# Patient Record
Sex: Male | Born: 1950 | Race: White | Hispanic: No | Marital: Married | Smoking: Never smoker
Health system: Southern US, Community
[De-identification: ages and names within clinical notes are randomized; demographics above are authoritative.]

## PROBLEM LIST (undated history)

## (undated) DIAGNOSIS — M069 Rheumatoid arthritis, unspecified: Secondary | ICD-10-CM

## (undated) DIAGNOSIS — T7840XA Allergy, unspecified, initial encounter: Secondary | ICD-10-CM

## (undated) DIAGNOSIS — K219 Gastro-esophageal reflux disease without esophagitis: Secondary | ICD-10-CM

## (undated) DIAGNOSIS — M359 Systemic involvement of connective tissue, unspecified: Secondary | ICD-10-CM

## (undated) DIAGNOSIS — C4490 Unspecified malignant neoplasm of skin, unspecified: Secondary | ICD-10-CM

## (undated) DIAGNOSIS — I4891 Unspecified atrial fibrillation: Secondary | ICD-10-CM

## (undated) DIAGNOSIS — IMO0002 Reserved for concepts with insufficient information to code with codable children: Secondary | ICD-10-CM

## (undated) DIAGNOSIS — R911 Solitary pulmonary nodule: Secondary | ICD-10-CM

## (undated) DIAGNOSIS — J45909 Unspecified asthma, uncomplicated: Secondary | ICD-10-CM

## (undated) DIAGNOSIS — K648 Other hemorrhoids: Secondary | ICD-10-CM

## (undated) HISTORY — PX: ESOPHAGOGASTRODUODENOSCOPY: SHX5428

## (undated) HISTORY — PX: COLONOSCOPY: SHX174

## (undated) HISTORY — PX: WRIST FUSION: SHX839

## (undated) HISTORY — DX: Unspecified malignant neoplasm of skin, unspecified: C44.90

## (undated) HISTORY — DX: Other hemorrhoids: K64.8

## (undated) HISTORY — DX: Reserved for concepts with insufficient information to code with codable children: IMO0002

## (undated) HISTORY — DX: Allergy, unspecified, initial encounter: T78.40XA

## (undated) HISTORY — PX: COLONOSCOPY W/ POLYPECTOMY: SHX1380

## (undated) HISTORY — PX: FOOT SURGERY: SHX648

## (undated) HISTORY — DX: Solitary pulmonary nodule: R91.1

## (undated) HISTORY — DX: Gastro-esophageal reflux disease without esophagitis: K21.9

## (undated) HISTORY — DX: Unspecified atrial fibrillation: I48.91

---

## 2001-12-15 ENCOUNTER — Encounter: Payer: Self-pay | Admitting: Emergency Medicine

## 2001-12-16 ENCOUNTER — Inpatient Hospital Stay (HOSPITAL_COMMUNITY): Admission: EM | Admit: 2001-12-16 | Discharge: 2001-12-18 | Payer: Self-pay | Admitting: *Deleted

## 2002-03-06 ENCOUNTER — Ambulatory Visit (HOSPITAL_COMMUNITY): Admission: RE | Admit: 2002-03-06 | Discharge: 2002-03-06 | Payer: Self-pay | Admitting: Family Medicine

## 2002-03-06 ENCOUNTER — Encounter: Payer: Self-pay | Admitting: Family Medicine

## 2003-05-07 ENCOUNTER — Ambulatory Visit (HOSPITAL_COMMUNITY): Admission: RE | Admit: 2003-05-07 | Discharge: 2003-05-07 | Payer: Self-pay | Admitting: Family Medicine

## 2003-05-14 ENCOUNTER — Ambulatory Visit (HOSPITAL_COMMUNITY): Admission: RE | Admit: 2003-05-14 | Discharge: 2003-05-14 | Payer: Self-pay | Admitting: Family Medicine

## 2003-05-25 HISTORY — PX: SHOULDER OPEN ROTATOR CUFF REPAIR: SHX2407

## 2010-06-13 ENCOUNTER — Encounter: Payer: Self-pay | Admitting: Internal Medicine

## 2010-06-13 ENCOUNTER — Encounter: Payer: Self-pay | Admitting: Family Medicine

## 2010-06-26 ENCOUNTER — Other Ambulatory Visit (HOSPITAL_COMMUNITY): Payer: Self-pay | Admitting: Family Medicine

## 2010-06-26 ENCOUNTER — Ambulatory Visit (HOSPITAL_COMMUNITY)
Admission: RE | Admit: 2010-06-26 | Discharge: 2010-06-26 | Disposition: A | Discharge status: Home / Self Care | Payer: Managed Care, Other (non HMO) | Source: Ambulatory Visit | Attending: Family Medicine | Admitting: Family Medicine

## 2010-06-26 DIAGNOSIS — R209 Unspecified disturbances of skin sensation: Secondary | ICD-10-CM | POA: Insufficient documentation

## 2010-06-26 DIAGNOSIS — M5137 Other intervertebral disc degeneration, lumbosacral region: Secondary | ICD-10-CM | POA: Insufficient documentation

## 2010-06-26 DIAGNOSIS — M545 Low back pain, unspecified: Secondary | ICD-10-CM | POA: Insufficient documentation

## 2010-06-26 DIAGNOSIS — M549 Dorsalgia, unspecified: Secondary | ICD-10-CM

## 2010-06-26 DIAGNOSIS — M51379 Other intervertebral disc degeneration, lumbosacral region without mention of lumbar back pain or lower extremity pain: Secondary | ICD-10-CM | POA: Insufficient documentation

## 2010-12-13 ENCOUNTER — Encounter: Payer: Self-pay | Admitting: *Deleted

## 2010-12-13 ENCOUNTER — Other Ambulatory Visit: Payer: Self-pay

## 2010-12-13 ENCOUNTER — Emergency Department (HOSPITAL_COMMUNITY)
Admission: EM | Admit: 2010-12-13 | Discharge: 2010-12-13 | Disposition: A | Discharge status: Other Acute Inpatient Hospital | Payer: Managed Care, Other (non HMO) | Attending: Emergency Medicine | Admitting: Emergency Medicine

## 2010-12-13 ENCOUNTER — Emergency Department (HOSPITAL_COMMUNITY): Payer: Managed Care, Other (non HMO)

## 2010-12-13 DIAGNOSIS — M069 Rheumatoid arthritis, unspecified: Secondary | ICD-10-CM | POA: Insufficient documentation

## 2010-12-13 DIAGNOSIS — J9801 Acute bronchospasm: Secondary | ICD-10-CM | POA: Insufficient documentation

## 2010-12-13 DIAGNOSIS — K219 Gastro-esophageal reflux disease without esophagitis: Secondary | ICD-10-CM | POA: Insufficient documentation

## 2010-12-13 HISTORY — DX: Gastro-esophageal reflux disease without esophagitis: K21.9

## 2010-12-13 HISTORY — DX: Rheumatoid arthritis, unspecified: M06.9

## 2010-12-13 LAB — CBC
HCT: 44.9 % (ref 39.0–52.0)
MCV: 89.6 fL (ref 78.0–100.0)
RBC: 5.01 MIL/uL (ref 4.22–5.81)
WBC: 9.1 10*3/uL (ref 4.0–10.5)

## 2010-12-13 LAB — BASIC METABOLIC PANEL
BUN: 20 mg/dL (ref 6–23)
Chloride: 101 mEq/L (ref 96–112)
GFR calc Af Amer: >60 mL/min (ref >60)
Glucose, Bld: 123 mg/dL — ABNORMAL HIGH (ref 70–99)
Potassium: 3.2 mEq/L — ABNORMAL LOW (ref 3.5–5.1)

## 2010-12-13 IMAGING — CR DG CHEST 2V
2 series · 2 of 2 positions shown · non-contrast
Comparison: None.

CLINICAL DATA: Shortness of breath.

CHEST - 2 VIEW

[view not recorded (1 of 2)]
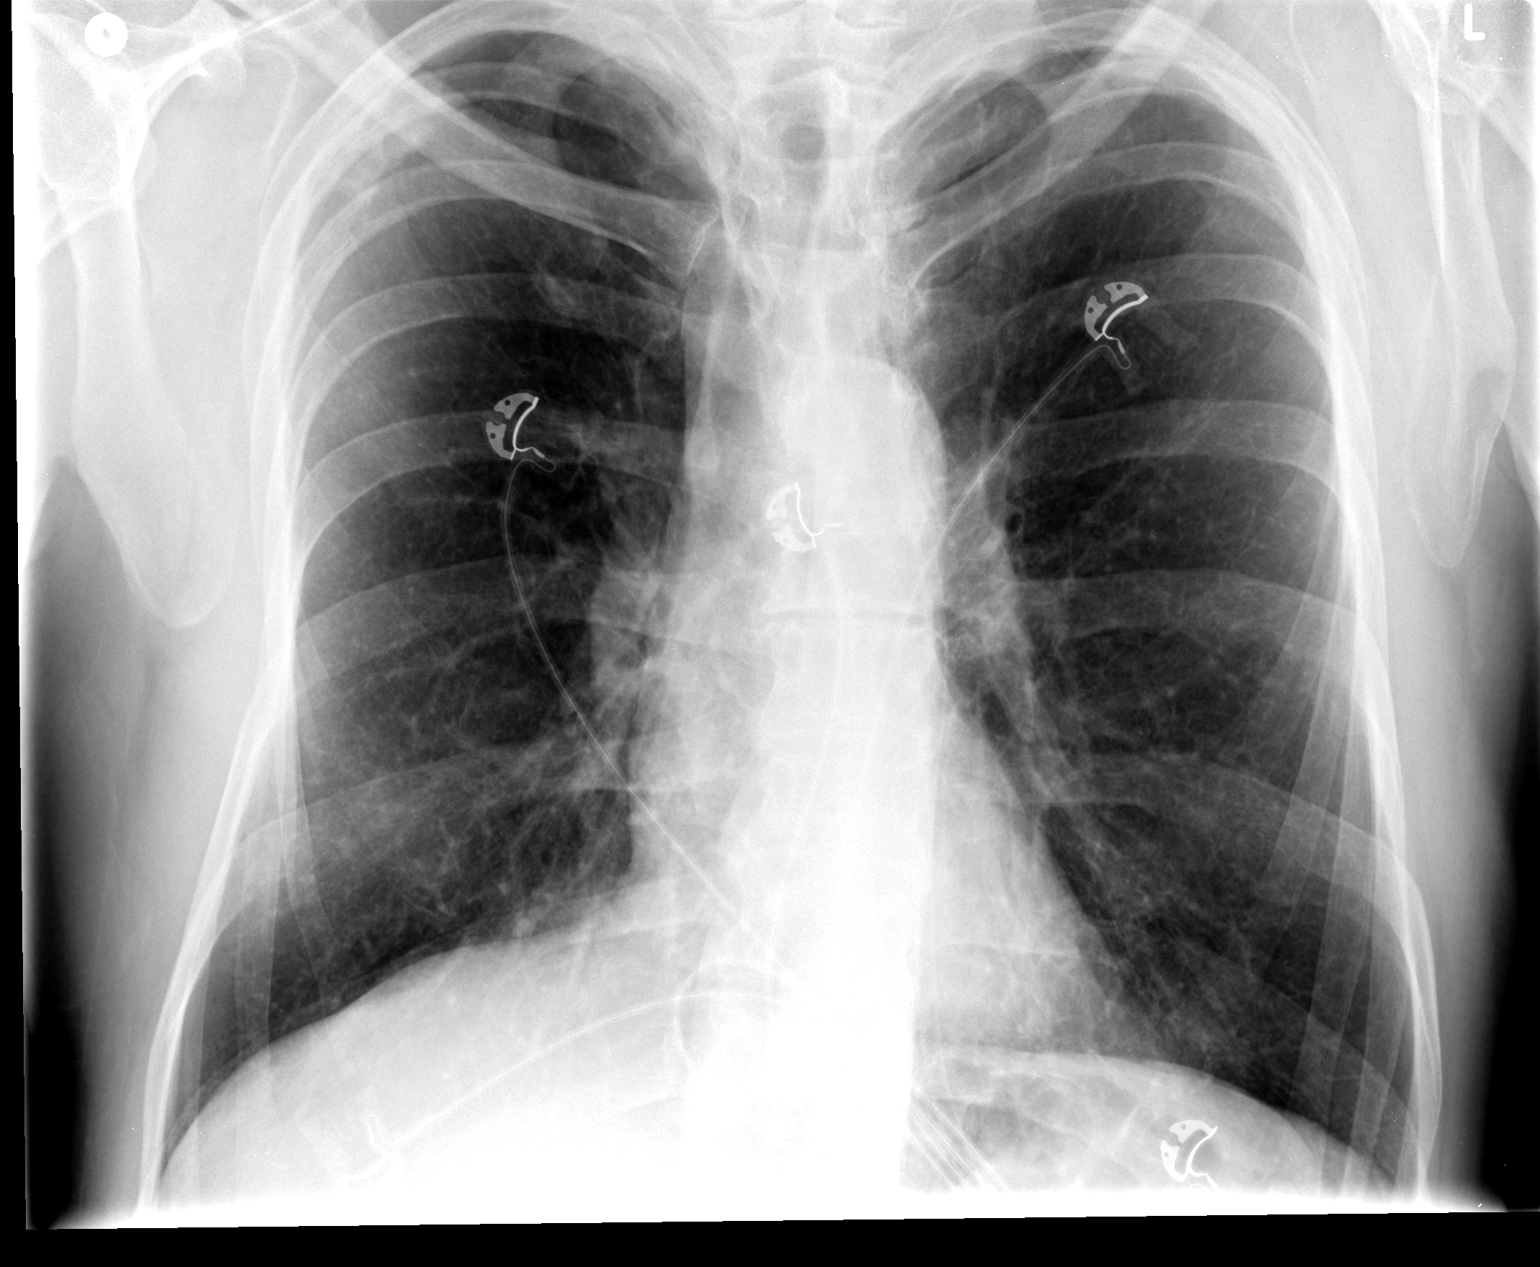

[view not recorded (2 of 2)]
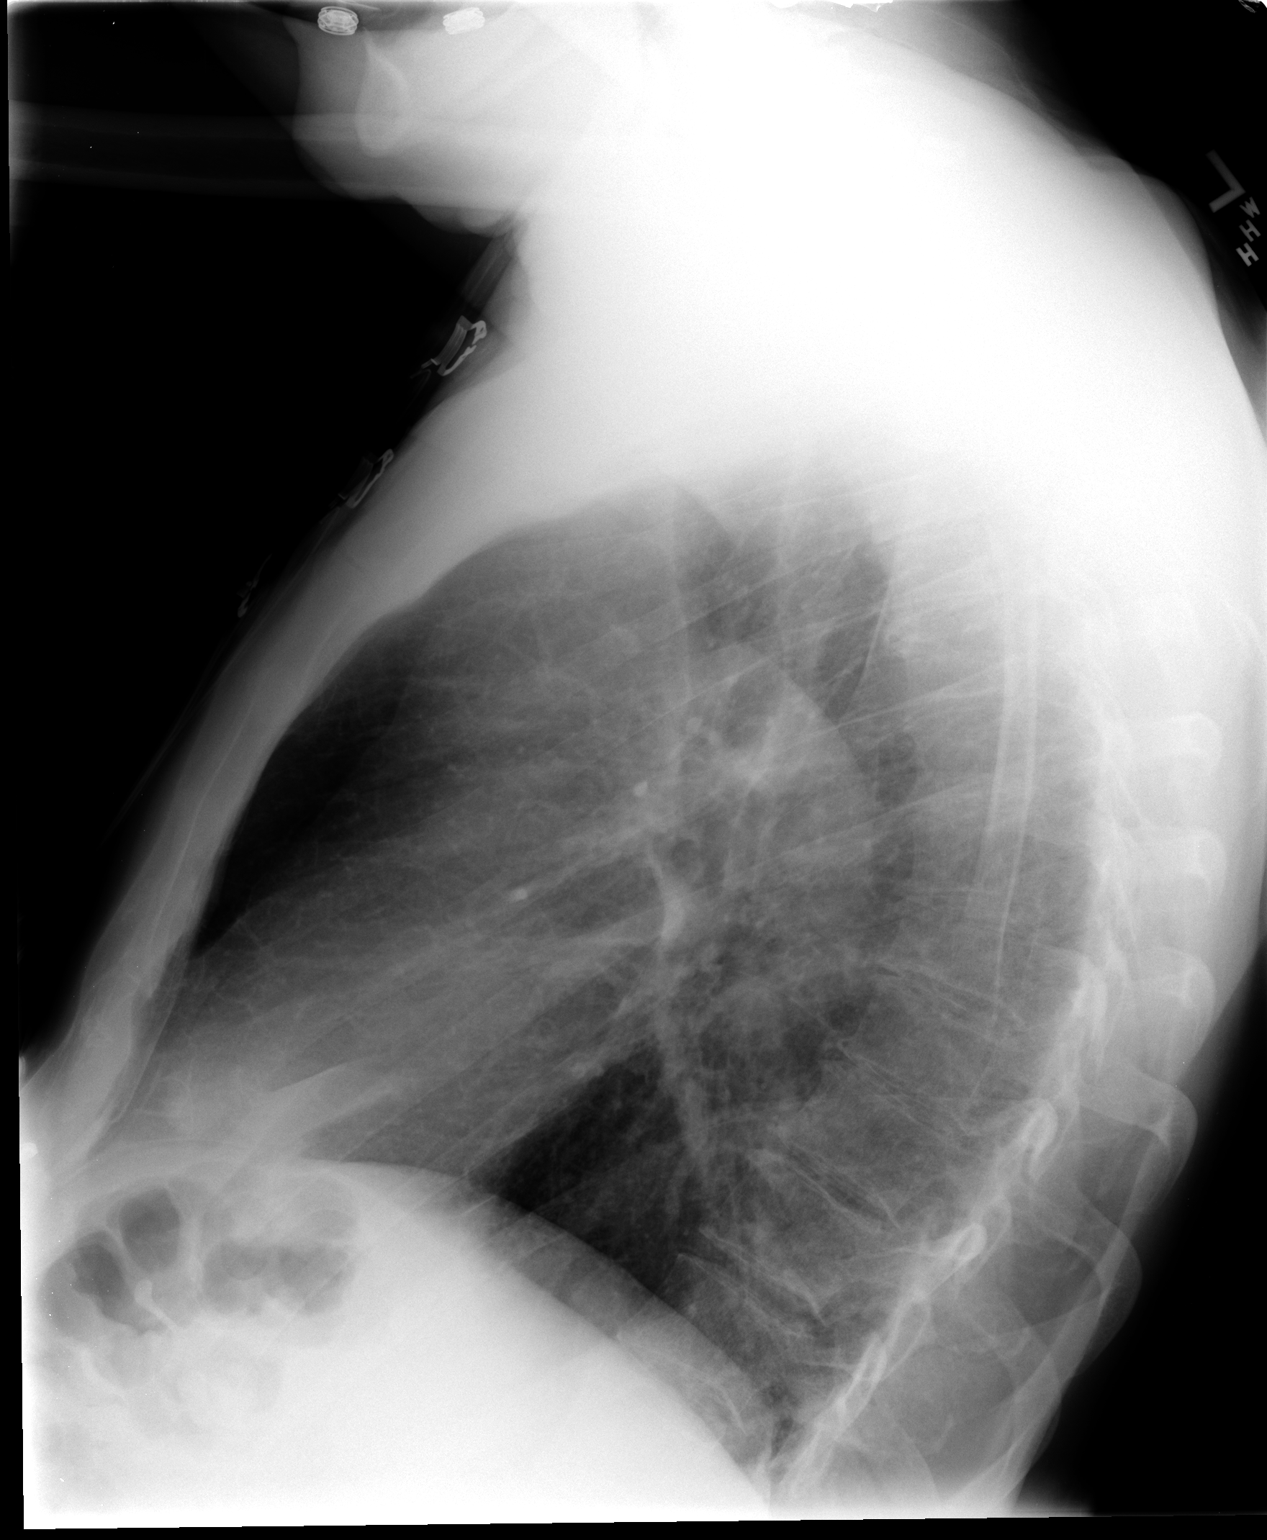

[2 of 2 positions shown; findings below may reference images not displayed]

FINDINGS: Hyperexpanded.  Mild coarse interstitial markings.  No
focal consolidation, pleural effusion, or pneumothorax.  Diffuse
osteopenia.  Atherosclerotic disease.
IMPRESSION: Chronic changes with hyperexpanded lungs.  No focal consolidation.

## 2010-12-13 MED ORDER — IPRATROPIUM BROMIDE 0.02 % IN SOLN
0.5000 mg | Freq: Once | RESPIRATORY_TRACT | Status: AC
  Administered 2010-12-13: 0.5 mg via RESPIRATORY_TRACT

## 2010-12-13 MED ORDER — GI COCKTAIL ~~LOC~~
30.0000 mL | Freq: Once | ORAL | Status: AC
  Administered 2010-12-13: 30 mL via ORAL
  Filled 2010-12-13 (×2): qty 30

## 2010-12-13 MED ORDER — SODIUM CHLORIDE 0.9 % IV SOLN
Freq: Once | INTRAVENOUS | Status: AC
  Administered 2010-12-13: 02:00:00 via INTRAVENOUS

## 2010-12-13 MED ORDER — ALBUTEROL SULFATE (5 MG/ML) 0.5% IN NEBU
5.0000 mg | INHALATION_SOLUTION | Freq: Once | RESPIRATORY_TRACT | Status: AC
  Administered 2010-12-13: 5 mg via RESPIRATORY_TRACT

## 2010-12-13 MED ORDER — ALBUTEROL SULFATE HFA 108 (90 BASE) MCG/ACT IN AERS
2.0000 | INHALATION_SPRAY | Freq: Once | RESPIRATORY_TRACT | Status: AC
  Administered 2010-12-13: 2 via RESPIRATORY_TRACT
  Filled 2010-12-13: qty 6.7

## 2010-12-13 NOTE — ED Notes (Signed)
Pt arrived via POV, tonight began having intermittent episodes of sob and coughing

## 2010-12-13 NOTE — ED Notes (Signed)
Pt RR 18 - 20

## 2010-12-13 NOTE — ED Provider Notes (Addendum)
History     Chief Complaint  Patient presents with  . Shortness of Breath   Patient is a 60 y.o. male presenting with shortness of breath. The history is provided by the patient.  Shortness of Breath  The current episode started more than 1 week ago. The onset was gradual. The problem occurs frequently. The problem has been gradually worsening (increasing episdoes of difficulty breathing, followed by normal breathing. reports there are times when he just can't catch his breathing. no CP). The problem is moderate. The symptoms are relieved by nothing. The symptoms are aggravated by nothing. Associated symptoms include sore throat, cough and shortness of breath. Pertinent negatives include no chest pain, no chest pressure, no orthopnea, no fever and no wheezing. The cough has no precipitants. The cough is productive. Nothing relieves the cough. Nothing worsens the cough. He has been experiencing a mild sore throat. The sore throat is characterized by pain only. His past medical history does not include asthma or past wheezing. Recent Medical Care: currently on levaquin by pcp.    Past Medical History  Diagnosis Date  . Rheumatoid arthritis   . Acid reflux disease     Past Surgical History  Procedure Date  . Wrist fusion     No family history on file.  History  Substance Use Topics  . Smoking status: Never Smoker   . Smokeless tobacco: Not on file  . Alcohol Use: No      Review of Systems  Constitutional: Negative for fever.  HENT: Positive for sore throat.   Respiratory: Positive for cough and shortness of breath. Negative for wheezing.   Cardiovascular: Negative for chest pain and orthopnea.  All other systems reviewed and are negative.    Physical Exam  BP 125/73  Pulse 69  Temp(Src) 98.2 F (36.8 C) (Oral)  Resp 10  SpO2 100%  Physical Exam  Nursing note and vitals reviewed. Constitutional: He is oriented to person, place, and time. He appears well-developed and  well-nourished.  HENT:  Head: Normocephalic and atraumatic.  Mouth/Throat: Oropharynx is clear and moist. No oropharyngeal exudate.  Eyes: EOM are normal.  Neck: Normal range of motion.  Cardiovascular: Normal rate, regular rhythm, normal heart sounds and intact distal pulses.   Pulmonary/Chest: Effort normal and breath sounds normal. No respiratory distress.  Abdominal: Soft. He exhibits no distension. There is no tenderness.  Musculoskeletal: Normal range of motion.  Neurological: He is alert and oriented to person, place, and time.  Skin: Skin is warm and dry.  Psychiatric: He has a normal mood and affect. Judgment normal.    ED Course  Procedures  MDM Pt with acute episode while I was in the room. It appeared more like bronchospasm or transient vocal cord spasm. Pt does report bad taste in his mouth and some change in his symptoms with laying flat. He also reports a "rawness" of his throat. Posterior pharynx appears normal. Significant improvement with albuterol. Already on protonix. Home with albuterol. Suspicion highest for bronchospasm. Doubt PE. Doubt atypical ACS. Doubt ball valve phenomenon near his glottis. No weight loss. Home in improved condition  Results for orders placed during the hospital encounter of 12/13/10  CBC      Component Value Range   WBC 9.1  4.0 - 10.5 (K/uL)   RBC 5.01  4.22 - 5.81 (MIL/uL)   Hemoglobin 16.3  13.0 - 17.0 (g/dL)   HCT 40.9  81.1 - 91.4 (%)   MCV 89.6  78.0 - 100.0 (  fL)   MCH 32.5  26.0 - 34.0 (pg)   MCHC 36.3 (*) 30.0 - 36.0 (g/dL)   RDW 84.1  32.4 - 40.1 (%)   Platelets 172  150 - 400 (K/uL)  TROPONIN I      Component Value Range   Troponin I <0.30  <0.30 (ng/mL)  BASIC METABOLIC PANEL      Component Value Range   Sodium 136  135 - 145 (mEq/L)   Potassium 3.2 (*) 3.5 - 5.1 (mEq/L)   Chloride 101  96 - 112 (mEq/L)   CO2 22  19 - 32 (mEq/L)   Glucose, Bld 123 (*) 70 - 99 (mg/dL)   BUN 20  6 - 23 (mg/dL)   Creatinine, Ser 0.27   0.50 - 1.35 (mg/dL)   Calcium 9.8  8.4 - 25.3 (mg/dL)   GFR calc non Af Amer >60  >60 (mL/min)   GFR calc Af Amer >60  >60 (mL/min)   Dg Neck Soft Tissue  12/13/2010  *RADIOLOGY REPORT*  Clinical Data: Dyspnea, sore throat  NECK SOFT TISSUES - 1+ VIEW  Comparison: None.  Findings: No prevertebral soft tissue swelling.  No radiopaque foreign body identified.  The visualized osseous structures are within normal limits. The airway appears patent.  IMPRESSION: No acute abnormality identified.  Original Report Authenticated By: Waneta Martins, M.D.   Dg Chest 2 View  12/13/2010  *RADIOLOGY REPORT*  Clinical Data: Shortness of breath.  CHEST - 2 VIEW  Comparison: None.  Findings: Hyperexpanded.  Mild coarse interstitial markings.  No focal consolidation, pleural effusion, or pneumothorax.  Diffuse osteopenia.  Atherosclerotic disease.  IMPRESSION: Chronic changes with hyperexpanded lungs.  No focal consolidation.  Original Report Authenticated By: Waneta Martins, M.D.          Lyanne Co, MD 12/13/10 0328   Date: 12/13/2010  Rate: 76  Rhythm: normal sinus rhythm  QRS Axis: normal  Intervals: normal  ST/T Wave abnormalities: normal  Conduction Disutrbances:none  Narrative Interpretation:   Old EKG Reviewed: unchanged    Lyanne Co, MD 12/13/10 [phone]

## 2010-12-17 ENCOUNTER — Ambulatory Visit (INDEPENDENT_AMBULATORY_CARE_PROVIDER_SITE_OTHER): Payer: Managed Care, Other (non HMO) | Admitting: Otolaryngology

## 2010-12-17 DIAGNOSIS — R059 Cough, unspecified: Secondary | ICD-10-CM

## 2010-12-17 DIAGNOSIS — R05 Cough: Secondary | ICD-10-CM

## 2010-12-17 DIAGNOSIS — J310 Chronic rhinitis: Secondary | ICD-10-CM

## 2010-12-30 ENCOUNTER — Encounter: Payer: Self-pay | Admitting: Gastroenterology

## 2010-12-30 ENCOUNTER — Ambulatory Visit (INDEPENDENT_AMBULATORY_CARE_PROVIDER_SITE_OTHER): Payer: Managed Care, Other (non HMO) | Admitting: Gastroenterology

## 2010-12-30 ENCOUNTER — Encounter: Payer: Self-pay | Admitting: General Practice

## 2010-12-30 DIAGNOSIS — R1310 Dysphagia, unspecified: Secondary | ICD-10-CM | POA: Insufficient documentation

## 2010-12-30 NOTE — Assessment & Plan Note (Signed)
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  EGD/DIL AUG 10. CONTINUE PROTONIX. DISCUSSED PROCEDURE, BENEFITS, & RISKS WITH PT. OPV IN 3 MOS. If Sx persist, BPE.

## 2010-12-30 NOTE — Patient Instructions (Addendum)
Follow a full liquid diet. SEE HANDOUT BELOW. CONTINUE PROTONIX.  Full Liquid Diet DESCRIPTION The full liquid diet includes those foods that are liquid or will become liquid at body temperature. A high-calorie, high-protein supplement should be used to meet your nutritional requirements when the full liquid diet is continued for more than 2 or 3 days.  WHEN IS THIS DIET USED?  As a transition diet between the clear liquid diet and solid foods.   When patients cannot tolerate solid foods.    ADEQUACY The full liquid diet is nutritionally inadequate according to the Recommended Dietary Allowances of the [employer], except in ascorbic acid and calcium. Protein requirements can be met if adequate amounts of dairy products are consumed daily. The full liquid diet can be nutritionally adequate if it is fortified with a nutritional supplement. Your caregiver can give you recommendations on liquids that have nutritional supplements included in them. SPECIAL NOTES This diet is very restrictive. Its use should be limited to a short period of time and only under the advice or supervision of your caregiver or dietitian. If this diet is to be used for an extended period of time (more than 7 days), a multivitamin should be considered.  Food Group: Breads/Starches   Allowed/Recommended: None are allowed except crackers that are pureed (made into a thick, smooth soup) in soup. Cooked, refined corn, oat, rice, rye, and wheat cereals are also allowed.   Avoid/Use Sparingly: Any others.   Food Group: Potatoes/Pasta/Rice   Allowed/Recommended: None except pureed in soup.   Avoid/Use Sparingly: Any others.   Food Group: Vegetables   Allowed/Recommended: Strained tomato or vegetable juice. Vegetables pureed in soup.   Avoid/Use Sparingly: Any others.   Food Group: Fruit   Allowed/Recommended: Any strained fruit juices and fruit drinks. Include 1 serving of citrus or vitamin  C-enriched fruit juice daily.   Avoid/Use Sparingly: Any others.   Food Group: Meat and Meat Substitutes   Allowed/Recommended: Eggs in custard, eggnog mix, eggs used in ice cream or pudding.   Avoid/Use Sparingly: Any meat, fish, or fowl. All cheese. All other cooked or raw eggs.   Food Group: Milk   Allowed/Recommended: Milk and milk-based beverages, including milk shakes and instant breakfast mixes. Smooth yogurt.   Avoid/Use Sparingly: Any others. Avoid dairy products if not tolerated.   Food Group: Soups and Combination Foods   Allowed/Recommended: Broth, strained cream soups. Strained, broth-based soups.   Avoid/Use Sparingly: Any others.   Food Group: Desserts and Sweets   Allowed/Recommended: Custard, flavored gelatin, tapioca, plain ice cream, sherbet, smooth pudding, junket, fruit ices, frozen ice pops, pudding pops. Other frozen bars with cream, frozen fudge pops, chocolate syrup. Sugar, honey, jelly, syrup.   Avoid/Use Sparingly: Any others.   Food Group: Fats and Oils   Allowed/Recommended: Margarine, butter, cream, sour cream, oils.   Avoid/Use Sparingly: Any others.   Food Group: Beverages   Allowed/Recommended: All.   Avoid/Use Sparingly: None.   Food Group: Condiments/Miscellaneous   Allowed/Recommended: Iodized salt, pepper, spices, flavorings. Cocoa powder.   Avoid/Use Sparingly: Any others.  SAMPLE MEAL PLAN Breakfast 1/2 cup orange juice 1 cup cooked wheat cereal 1 cup milk 1 cup beverage (coffee or tea) Cream and/or sugar if desired Midmorning Snack 1 cup pasteurized eggnog (made from powdered eggs mixed with milk, not raw eggs)  Noon Meal 1 cup cream soup 1/2 cup fruit juice 1 cup milk 1/2 cup custard 1 cup beverage (coffee or tea) Cream and/or sugar if desired  Midafternoon Snack 1 cup milk shake  Evening Meal 1 cup cream soup 1/2 cup fruit juice 1 cup milk 1/2 cup pudding 1 cup beverage (coffee or tea) Cream and/or sugar if  desired Evening Snack 1 cup supplement  To increase calories, sugar, cream, butter, or margarine should be added if possible. Nutritional supplements will also increase the total calories. The above sample meal plan cannot meet the Recommended Dietary Allowances of the [employer] without appropriate supplementation under the guidance of your caregiver or dietitian. Document Released: 05/10/2005 Document Re-Released: 08/04/2009 [facility] Patient Information 2011 [place], [place].

## 2010-12-30 NOTE — Progress Notes (Signed)
Subjective:    Patient ID: Steven Glover, male    DOB: 1951-01-24, 60 y.o.   MRN: [id number]  PCP: Charlies Silvers  Rheum: [place], [place]   HPI Trouble swallowing and sometimes food comes back up. Has got strangled and had to throw up.  Had sinus infection around 1st of JUL. SEEN BY DR. Suszanne Conners no ENT etiology for his Sx identified.   Has had Rheumatoid Arthritis X 11 YEARS and can't fight off infection real good. 2 weeks ago seen in ED because he couldn't breath. Ever since things got worse. Has trouble with both. If drinks any liquids, can't take a normal drink. Can only take sips. Lost 10 lbs. Can't sleep because afraid to lay down because fear things might come back up and he's suffocate. Whether he eats or not, still has to belch. Rare NSAIDS or ASA for HAs. Been on Protonix since 7/20. On MTX and Enbrel for 6-7 years. No blood in stool or black stool. BM: 1x/day. No CP or abd pain.  Past Medical History  Diagnosis Date  . Rheumatoid arthritis   . Acid reflux disease   . GERD (gastroesophageal reflux disease)   . BMI between 19-24,adult JUL 2012 191.8 LBS    Past Surgical History  Procedure Date  . Wrist fusion   . Colonoscopy w/ polypectomy 2010 NUR    No Known Allergies  Current Outpatient Prescriptions  Medication Sig Dispense Refill  . etanercept (ENBREL) 50 MG/ML injection Inject 50 mg into the skin once a week.        . fluticasone (FLONASE) 50 MCG/ACT nasal spray Place 2 sprays into the nose daily.        . folic acid (FOLVITE) 1 MG tablet Take 1 mg by mouth daily.        . methotrexate 2.5 MG tablet Take 2.5 mg by mouth once a week.        . pantoprazole (PROTONIX) 40 MG tablet Take 40 mg by mouth daily.        Marland Kitchen glucose blood test strip 1 each by Other route as needed. Use as instructed       . levofloxacin (LEVAQUIN) 500 MG tablet Take 500 mg by mouth daily.        Marland Kitchen PROAIR HFA 108 (90 BASE) MCG/ACT inhaler as needed.        Family History  Problem Relation Age of  Onset  . Colon cancer Neg Hx     History   Social History  . Marital Status: Married    Spouse Name: N/A   Social History Main Topics  . Smoking status: Never Smoker   . Smokeless tobacco: Not on file  . Alcohol Use: No  . Drug Use: No   Social History Narrative   MARRIED-WIFE SUSAN OWENS (ED [facility]), 2 CHILDREN    Review of Systems  All other systems reviewed and are negative.   FEB 2012: HB 15.7 CR 1.02 B12 285     Objective:   Physical Exam  Vitals reviewed. Constitutional: He is oriented to person, place, and time. He appears well-developed and well-nourished. No distress.  HENT:  Head: Normocephalic and atraumatic.  Eyes: Pupils are equal, round, and reactive to light. No scleral icterus.  Neck: Normal range of motion. Neck supple.  Cardiovascular: Normal rate, regular rhythm and normal heart sounds.   Pulmonary/Chest: Effort normal and breath sounds normal.  Abdominal: Soft. Bowel sounds are normal. He exhibits no distension. There is tenderness (MILD LUQ  TTP).  Musculoskeletal: Normal range of motion.  Lymphadenopathy:    He has no cervical adenopathy.  Neurological: He is alert and oriented to person, place, and time.       NO FOCAL DEFICITS  Psychiatric: He has a normal mood and affect.          Assessment & Plan:

## 2011-01-01 ENCOUNTER — Ambulatory Visit (HOSPITAL_COMMUNITY)
Admission: RE | Admit: 2011-01-01 | Discharge: 2011-01-01 | Disposition: A | Discharge status: Home / Self Care | Payer: Managed Care, Other (non HMO) | Source: Ambulatory Visit | Attending: Gastroenterology | Admitting: Gastroenterology

## 2011-01-01 ENCOUNTER — Encounter (HOSPITAL_COMMUNITY)
Admission: RE | Disposition: A | Discharge status: Home / Self Care | Payer: Self-pay | Source: Ambulatory Visit | Attending: Gastroenterology

## 2011-01-01 ENCOUNTER — Other Ambulatory Visit: Payer: Self-pay | Admitting: Gastroenterology

## 2011-01-01 ENCOUNTER — Encounter (HOSPITAL_COMMUNITY): Payer: Self-pay

## 2011-01-01 DIAGNOSIS — K222 Esophageal obstruction: Secondary | ICD-10-CM

## 2011-01-01 DIAGNOSIS — K2940 Chronic atrophic gastritis without bleeding: Secondary | ICD-10-CM | POA: Insufficient documentation

## 2011-01-01 DIAGNOSIS — R1310 Dysphagia, unspecified: Secondary | ICD-10-CM | POA: Insufficient documentation

## 2011-01-01 DIAGNOSIS — K219 Gastro-esophageal reflux disease without esophagitis: Secondary | ICD-10-CM

## 2011-01-01 DIAGNOSIS — K2990 Gastroduodenitis, unspecified, without bleeding: Secondary | ICD-10-CM

## 2011-01-01 DIAGNOSIS — K2970 Gastritis, unspecified, without bleeding: Secondary | ICD-10-CM

## 2011-01-01 HISTORY — PX: SAVORY DILATION: SHX5439

## 2011-01-01 SURGERY — EGD (ESOPHAGOGASTRODUODENOSCOPY)
Anesthesia: Moderate Sedation

## 2011-01-01 MED ORDER — MEPERIDINE HCL 100 MG/ML IJ SOLN
INTRAMUSCULAR | Status: DC | PRN
  Administered 2011-01-01: 50 mg via INTRAVENOUS
  Administered 2011-01-01: 25 mg via INTRAVENOUS
  Administered 2011-01-01: 50 mg via INTRAVENOUS

## 2011-01-01 MED ORDER — MINERAL OIL PO OIL
TOPICAL_OIL | ORAL | Status: AC
  Filled 2011-01-01: qty 30

## 2011-01-01 MED ORDER — PROMETHAZINE HCL 25 MG/ML IJ SOLN
INTRAMUSCULAR | Status: DC | PRN
  Administered 2011-01-01: 12.5 mg via INTRAVENOUS

## 2011-01-01 MED ORDER — MIDAZOLAM HCL 5 MG/5ML IJ SOLN
INTRAMUSCULAR | Status: DC | PRN
  Administered 2011-01-01 (×3): 2 mg via INTRAVENOUS

## 2011-01-01 MED ORDER — FLUMAZENIL 0.5 MG/5ML IV SOLN
INTRAVENOUS | Status: DC | PRN
  Administered 2011-01-01: 0.2 mg via INTRAVENOUS

## 2011-01-01 MED ORDER — MEPERIDINE HCL 100 MG/ML IJ SOLN
INTRAMUSCULAR | Status: AC
  Filled 2011-01-01: qty 2

## 2011-01-01 MED ORDER — STERILE WATER FOR IRRIGATION IR SOLN
Status: DC | PRN
  Administered 2011-01-01: 13:00:00

## 2011-01-01 MED ORDER — BUTAMBEN-TETRACAINE-BENZOCAINE 2-2-14 % EX AERO
INHALATION_SPRAY | CUTANEOUS | Status: DC | PRN
  Administered 2011-01-01: 1 via TOPICAL

## 2011-01-01 MED ORDER — PROMETHAZINE HCL 25 MG/ML IJ SOLN
INTRAMUSCULAR | Status: AC
  Filled 2011-01-01: qty 1

## 2011-01-01 MED ORDER — ONDANSETRON HCL 4 MG/2ML IJ SOLN
INTRAMUSCULAR | Status: AC
  Administered 2011-01-01: 4 mg
  Filled 2011-01-01: qty 2

## 2011-01-01 MED ORDER — PANTOPRAZOLE SODIUM 40 MG PO TBEC: DELAYED_RELEASE_TABLET | ORAL | Status: DC

## 2011-01-01 MED ORDER — SODIUM CHLORIDE 0.45 % IV SOLN
Freq: Once | INTRAVENOUS | Status: AC
  Administered 2011-01-01: 12:00:00 via INTRAVENOUS

## 2011-01-01 MED ORDER — MIDAZOLAM HCL 5 MG/5ML IJ SOLN
INTRAMUSCULAR | Status: AC
  Filled 2011-01-01: qty 10

## 2011-01-01 NOTE — Interval H&P Note (Signed)
History and Physical Interval Note:   01/01/2011   12:29 PM   Steven Glover  has presented today for surgery, with the diagnosis of dysphagia  The various methods of treatment have been discussed with the patient and family. After consideration of risks, benefits and other options for treatment, the patient has consented to  Procedure(s): ESOPHAGOGASTRODUODENOSCOPY (EGD) MALONEY DILATION SAVORY DILATION as a surgical intervention .  I have reviewed the patients' chart and labs.  Questions were answered to the patient's satisfaction.     Jonette Eva  MD

## 2011-01-01 NOTE — H&P (Signed)
  In epic 8/8 

## 2011-01-04 ENCOUNTER — Telehealth: Payer: Self-pay

## 2011-01-04 DIAGNOSIS — R1110 Vomiting, unspecified: Secondary | ICD-10-CM

## 2011-01-04 NOTE — Telephone Encounter (Signed)
PLEASE CALL PT. His esophagus should be dilated sufficiently to avoid vomiting solids. Food. He needs Zofran 4 mg po qid for the next 5 days, #qs, rfx1 and he needs a gastric emptying study WED or THUR of this week.

## 2011-01-04 NOTE — Telephone Encounter (Signed)
Pt aware.

## 2011-01-04 NOTE — Telephone Encounter (Signed)
Pt called and said that he had endoscopy done on Friday. York Spaniel he is still having the vomiting and losing his breath some, as he did before the endoscopy. He can keep down jello and soft foods and liquids, but when he eats solids he has the vomiting. Please advise!

## 2011-01-04 NOTE — Telephone Encounter (Signed)
Pt informed. Rx called to [facility] to [place]. He is aware that Durward Mallard will call to schedule GES. He said to please call his cell [phone].

## 2011-01-04 NOTE — Telephone Encounter (Signed)
Pt is scheduled for 01/07/11@8 :00am. tp to arrive at 7:30am NPO after MN.

## 2011-01-05 ENCOUNTER — Other Ambulatory Visit: Payer: Self-pay | Admitting: Family Medicine

## 2011-01-05 ENCOUNTER — Telehealth: Payer: Self-pay | Admitting: Gastroenterology

## 2011-01-05 ENCOUNTER — Ambulatory Visit (HOSPITAL_COMMUNITY)
Admission: RE | Admit: 2011-01-05 | Discharge: 2011-01-05 | Disposition: A | Discharge status: Home / Self Care | Payer: Managed Care, Other (non HMO) | Source: Ambulatory Visit | Attending: Family Medicine | Admitting: Family Medicine

## 2011-01-05 DIAGNOSIS — J449 Chronic obstructive pulmonary disease, unspecified: Secondary | ICD-10-CM | POA: Insufficient documentation

## 2011-01-05 DIAGNOSIS — J4489 Other specified chronic obstructive pulmonary disease: Secondary | ICD-10-CM | POA: Insufficient documentation

## 2011-01-05 DIAGNOSIS — R059 Cough, unspecified: Secondary | ICD-10-CM | POA: Insufficient documentation

## 2011-01-05 DIAGNOSIS — R05 Cough: Secondary | ICD-10-CM

## 2011-01-05 NOTE — Telephone Encounter (Signed)
Results Cc to PCP  

## 2011-01-05 NOTE — Telephone Encounter (Signed)
Please call pt. He has mild gastritis. Continue Protonix. Will call him after he completes his GES.

## 2011-01-06 NOTE — Telephone Encounter (Signed)
Called, many rings and no answer.

## 2011-01-07 ENCOUNTER — Encounter (HOSPITAL_COMMUNITY): Payer: Self-pay

## 2011-01-07 ENCOUNTER — Encounter (HOSPITAL_COMMUNITY)
Admission: RE | Admit: 2011-01-07 | Discharge: 2011-01-07 | Disposition: A | Discharge status: Home / Self Care | Payer: Managed Care, Other (non HMO) | Source: Ambulatory Visit | Attending: Gastroenterology | Admitting: Gastroenterology

## 2011-01-07 DIAGNOSIS — K219 Gastro-esophageal reflux disease without esophagitis: Secondary | ICD-10-CM | POA: Insufficient documentation

## 2011-01-07 DIAGNOSIS — R1110 Vomiting, unspecified: Secondary | ICD-10-CM

## 2011-01-07 HISTORY — DX: Systemic involvement of connective tissue, unspecified: M35.9

## 2011-01-07 MED ORDER — TECHNETIUM TC 99M SULFUR COLLOID
2.0000 | Freq: Once | INTRAVENOUS | Status: AC | PRN
  Administered 2011-01-07: 2 via ORAL

## 2011-01-08 ENCOUNTER — Encounter (HOSPITAL_COMMUNITY): Payer: Self-pay | Admitting: Gastroenterology

## 2011-01-11 ENCOUNTER — Other Ambulatory Visit: Payer: Self-pay | Admitting: Family Medicine

## 2011-01-11 ENCOUNTER — Ambulatory Visit (HOSPITAL_COMMUNITY)
Admission: RE | Admit: 2011-01-11 | Discharge: 2011-01-11 | Disposition: A | Discharge status: Home / Self Care | Payer: Managed Care, Other (non HMO) | Source: Ambulatory Visit | Attending: Family Medicine | Admitting: Family Medicine

## 2011-01-11 DIAGNOSIS — R05 Cough: Secondary | ICD-10-CM | POA: Insufficient documentation

## 2011-01-11 DIAGNOSIS — R059 Cough, unspecified: Secondary | ICD-10-CM | POA: Insufficient documentation

## 2011-01-11 DIAGNOSIS — J984 Other disorders of lung: Secondary | ICD-10-CM | POA: Insufficient documentation

## 2011-01-11 MED ORDER — IOHEXOL 300 MG/ML  SOLN: 80.0000 mL | Freq: Once | INTRAMUSCULAR | Status: AC | PRN

## 2011-01-11 NOTE — Telephone Encounter (Signed)
Pt informed. York Spaniel he had the GES on Friday.

## 2011-01-19 ENCOUNTER — Telehealth: Payer: Self-pay | Admitting: Gastroenterology

## 2011-01-19 NOTE — Telephone Encounter (Signed)
Problems with swallowing resolved. No nausea or vomiting. Burping is gone. GES nl. OPV in 3 mos, Dx: dysphagia

## 2011-01-20 NOTE — Telephone Encounter (Signed)
Results Cc to PCP  

## 2011-02-08 NOTE — Telephone Encounter (Signed)
Pt scheduled to see SLF on 03/24/11

## 2011-03-24 ENCOUNTER — Ambulatory Visit: Payer: Managed Care, Other (non HMO) | Admitting: Gastroenterology

## 2011-04-21 ENCOUNTER — Ambulatory Visit (INDEPENDENT_AMBULATORY_CARE_PROVIDER_SITE_OTHER): Payer: Managed Care, Other (non HMO) | Admitting: Gastroenterology

## 2011-04-21 ENCOUNTER — Encounter: Payer: Self-pay | Admitting: Gastroenterology

## 2011-04-21 DIAGNOSIS — R1310 Dysphagia, unspecified: Secondary | ICD-10-CM

## 2011-04-21 DIAGNOSIS — K219 Gastro-esophageal reflux disease without esophagitis: Secondary | ICD-10-CM

## 2011-04-21 DIAGNOSIS — Z1211 Encounter for screening for malignant neoplasm of colon: Secondary | ICD-10-CM

## 2011-04-21 MED ORDER — PANTOPRAZOLE SODIUM 40 MG PO TBEC: DELAYED_RELEASE_TABLET | ORAL | Status: DC

## 2011-04-21 NOTE — Progress Notes (Signed)
  Subjective:    Patient ID: Steven Glover, male    DOB: 1950-05-31, 60 y.o.   MRN: [id number]  PCP: Charlies Silvers  HPI Swallowing great. No nausea or vomitiing. Can eat just about everything.  Past Medical History  Diagnosis Date  . Rheumatoid arthritis   . Acid reflux disease   . GERD (gastroesophageal reflux disease)   . BMI between 19-24,adult JUL 2012 191.8 LBS  . Collagen vascular disease     Past Surgical History  Procedure Date  . Wrist fusion   . Colonoscopy w/ polypectomy 2010 NUR  . Shoulder open rotator cuff repair 2005  . Esophagogastroduodenoscopy 01/01/2011    Procedure: ESOPHAGOGASTRODUODENOSCOPY (EGD);  Surgeon: Arlyce Harman, MD;  Location: [facility];  Service: Endoscopy;  Laterality: N/A;  11:30AM  . Savory dilation 01/01/2011    Procedure: SAVORY DILATION;  Surgeon: Arlyce Harman, MD;  Location: [facility];  Service: Endoscopy;  Laterality: N/A;    No Known Allergies  Current Outpatient Prescriptions  Medication Sig Dispense Refill  . etanercept (ENBREL) 50 MG/ML injection Inject 50 mg into the skin once a week.        . fluticasone (FLONASE) 50 MCG/ACT nasal spray Place 2 sprays into the nose daily.        . folic acid (FOLVITE) 1 MG tablet Take 1 mg by mouth daily.        Marland Kitchen glucose blood test strip 1 each by Other route as needed. Use as instructed       . levofloxacin (LEVAQUIN) 500 MG tablet Take 500 mg by mouth daily.        . methotrexate 2.5 MG tablet Take 2.5 mg by mouth once a week.        . pantoprazole (PROTONIX) 40 MG tablet 1 PO 30 MINUTES PRIOR TO FIRST MEAL  31 tablet  11  . PROAIR HFA 108 (90 BASE) MCG/ACT inhaler as needed.             Review of Systems     Objective:   Physical Exam  Vitals reviewed. Constitutional: He is oriented to person, place, and time. He appears well-developed and well-nourished.  HENT:  Head: Normocephalic and atraumatic.  Cardiovascular: Normal rate, regular rhythm and normal heart sounds.     Pulmonary/Chest: Effort normal and breath sounds normal.  Abdominal: Soft. Bowel sounds are normal. He exhibits no distension. There is no tenderness.  Neurological: He is alert and oriented to person, place, and time.          Assessment & Plan:

## 2011-04-21 NOTE — Assessment & Plan Note (Signed)
WILL REVIEW PATH AND DETERMINE INTERVAL FOR NEXT tcs

## 2011-04-21 NOTE — Progress Notes (Signed)
Cc to PCP 

## 2011-04-21 NOTE — Assessment & Plan Note (Signed)
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  OPV IN 1 YEAR. CONTINUE PROTONIX DAILY. MAY TRY TO USE AS NEEDED IN [facility] 2013. REFILL X1 YEAR. CALL IF SWALLOWING PROBLEMS RETURN.

## 2011-04-21 NOTE — Assessment & Plan Note (Signed)
CONTINUE PROTONIX DAILY. MAY TRY TO USE AS NEEDED IN [facility] 2013. REFILL X1 YEAR.

## 2011-04-27 NOTE — Progress Notes (Signed)
Reminder in epic to follow up in one year °

## 2011-05-11 ENCOUNTER — Encounter (INDEPENDENT_AMBULATORY_CARE_PROVIDER_SITE_OTHER): Payer: Self-pay | Admitting: *Deleted

## 2011-05-25 DIAGNOSIS — R911 Solitary pulmonary nodule: Secondary | ICD-10-CM

## 2011-05-25 HISTORY — DX: Solitary pulmonary nodule: R91.1

## 2011-06-15 ENCOUNTER — Other Ambulatory Visit (INDEPENDENT_AMBULATORY_CARE_PROVIDER_SITE_OTHER): Payer: Self-pay | Admitting: *Deleted

## 2011-06-15 ENCOUNTER — Telehealth (INDEPENDENT_AMBULATORY_CARE_PROVIDER_SITE_OTHER): Payer: Self-pay | Admitting: *Deleted

## 2011-06-15 DIAGNOSIS — Z86010 Personal history of colonic polyps: Secondary | ICD-10-CM

## 2011-06-15 NOTE — Telephone Encounter (Signed)
Patient needs movi prep 

## 2011-06-17 ENCOUNTER — Telehealth (INDEPENDENT_AMBULATORY_CARE_PROVIDER_SITE_OTHER): Payer: Self-pay | Admitting: *Deleted

## 2011-06-17 NOTE — Telephone Encounter (Signed)
Requesting MD/PCP:  Lilyan Punt     Name & DOB: Steven Glover 06/11/1950     Procedure: tcs  Reason/Indication:  Hx polyps  Has patient had this procedure before?  yes  If so, when, by whom and where?  2007  Is there a family history of colon cancer?  no  Who?  What age when diagnosed?    Is patient diabetic?   no      Does patient have prosthetic heart valve?  no  Do you have a pacemaker?  no  Has patient had joint replacement within last 12 months?  no  Is patient on Coumadin, Plavix and/or Aspirin? no  Medications: enbrel 50 mg auto injector wkly (RA), folic acid 1 mg daily, methotrexate 2.5 mg 4 tabs once weekly, pantoprazole 40 mg daily, fexofenadine/hydrochloride 180 mg daily  Allergies: nkda  [facility]  Medication Adjustment: none  Procedure date & time: 07/14/11 @ 1030

## 2011-06-18 MED ORDER — PEG-KCL-NACL-NASULF-NA ASC-C 100 G PO SOLR: 1.0000 | Freq: Once | ORAL | Status: DC

## 2011-06-18 NOTE — Telephone Encounter (Signed)
agree

## 2011-07-14 ENCOUNTER — Ambulatory Visit: Admit: 2011-07-14 | Payer: Self-pay | Admitting: Internal Medicine

## 2011-07-14 SURGERY — COLONOSCOPY
Anesthesia: Moderate Sedation

## 2012-04-03 ENCOUNTER — Encounter: Payer: Self-pay | Admitting: *Deleted

## 2012-05-09 ENCOUNTER — Encounter: Payer: Self-pay | Admitting: Gastroenterology

## 2012-05-10 ENCOUNTER — Other Ambulatory Visit: Payer: Self-pay | Admitting: Gastroenterology

## 2012-05-10 ENCOUNTER — Ambulatory Visit (INDEPENDENT_AMBULATORY_CARE_PROVIDER_SITE_OTHER): Payer: Managed Care, Other (non HMO) | Admitting: Gastroenterology

## 2012-05-10 ENCOUNTER — Encounter: Payer: Self-pay | Admitting: Gastroenterology

## 2012-05-10 VITALS — BP 123/78 | HR 70 | Temp 98.0°F | Ht 75.0 in | Wt 194.6 lb

## 2012-05-10 DIAGNOSIS — Z1211 Encounter for screening for malignant neoplasm of colon: Secondary | ICD-10-CM

## 2012-05-10 DIAGNOSIS — K219 Gastro-esophageal reflux disease without esophagitis: Secondary | ICD-10-CM

## 2012-05-10 DIAGNOSIS — R1310 Dysphagia, unspecified: Secondary | ICD-10-CM

## 2012-05-10 MED ORDER — PANTOPRAZOLE SODIUM 40 MG PO TBEC: DELAYED_RELEASE_TABLET | ORAL | Status: DC

## 2012-05-10 MED ORDER — PEG 3350-KCL-NA BICARB-NACL 420 G PO SOLR: 4000.0000 mL | ORAL | Status: DC

## 2012-05-10 NOTE — Progress Notes (Signed)
CALLED PT. I REVIEWED HIS RECORDS FROM [facility]. HE SHOULD HAVE A TCS PRIOR TO JAN 1. DX: PERSONAL HX; POLYPS

## 2012-05-10 NOTE — Assessment & Plan Note (Signed)
FAILED AN ATTEMPT TO WITHDRAWAL RX.  PROTONIX 30 MINUTES PRIOR TO FIRST MEAL. EXPLAINED BENEFITS V. RISKS OF MEDS.  LOW FAT DIET OPV IN 1 YEAR

## 2012-05-10 NOTE — Patient Instructions (Addendum)
CONTINUE PROTONIX 30 MINUTES PRIOR TO YOUR FIRST MEAL.  FOLLOW A LOW FAT DIET. SEE INFO BELOW.  I WILL REVIEW YOUR COLONOSCOPY REPORT FROM [facility] AND CALL ABOUT WHEN TO SCHEDULE YOUR NEXT COLONOSCOPY.  FOLLOW UP IN 1 YEAR.  Low-Fat Diet BREADS, CEREALS, PASTA, RICE, DRIED PEAS, AND BEANS These products are high in carbohydrates and most are low in fat. Therefore, they can be increased in the diet as substitutes for fatty foods. They too, however, contain calories and should not be eaten in excess. Cereals can be eaten for snacks as well as for breakfast.   FRUITS AND VEGETABLES It is good to eat fruits and vegetables. Besides being sources of fiber, both are rich in vitamins and some minerals. They help you get the daily allowances of these nutrients. Fruits and vegetables can be used for snacks and desserts.  MEATS Limit lean meat, chicken, [place], and fish to no more than 6 ounces per day. Beef, Pork, and Lamb Use lean cuts of beef, pork, and lamb. Lean cuts include:  Extra-lean ground beef.  Arm roast.  Sirloin tip.  Center-cut ham.  Round steak.  Loin chops.  Rump roast.  Tenderloin.  Trim all fat off the outside of meats before cooking. It is not necessary to severely decrease the intake of red meat, but lean choices should be made. Lean meat is rich in protein and contains a highly absorbable form of iron. Premenopausal women, in particular, should avoid reducing lean red meat because this could increase the risk for low red blood cells (iron-deficiency anemia).  Chicken and [place] These are good sources of protein. The fat of poultry can be reduced by removing the skin and underlying fat layers before cooking. Chicken and [place] can be substituted for lean red meat in the diet. Poultry should not be fried or covered with high-fat sauces. Fish and Shellfish Fish is a good source of protein. Shellfish contain cholesterol, but they usually are low in saturated fatty acids. The  preparation of fish is important. Like chicken and [place], they should not be fried or covered with high-fat sauces. EGGS Egg whites contain no fat or cholesterol. They can be eaten often. Try 1 to 2 egg whites instead of whole eggs in recipes or use egg substitutes that do not contain yolk. MILK AND DAIRY PRODUCTS Use skim or 1% milk instead of 2% or whole milk. Decrease whole milk, natural, and processed cheeses. Use nonfat or low-fat (2%) cottage cheese or low-fat cheeses made from vegetable oils. Choose nonfat or low-fat (1 to 2%) yogurt. Experiment with evaporated skim milk in recipes that call for heavy cream. Substitute low-fat yogurt or low-fat cottage cheese for sour cream in dips and salad dressings. Have at least 2 servings of low-fat dairy products, such as 2 glasses of skim (or 1%) milk each day to help get your daily calcium intake. FATS AND OILS Reduce the total intake of fats, especially saturated fat. Butterfat, lard, and beef fats are high in saturated fat and cholesterol. These should be avoided as much as possible. Vegetable fats do not contain cholesterol, but certain vegetable fats, such as coconut oil, palm oil, and palm kernel oil are very high in saturated fats. These should be limited. These fats are often used in bakery goods, processed foods, popcorn, oils, and nondairy creamers. Vegetable shortenings and some peanut butters contain hydrogenated oils, which are also saturated fats. Read the labels on these foods and check for saturated vegetable oils. Unsaturated vegetable oils  and fats do not raise blood cholesterol. However, they should be limited because they are fats and are high in calories. Total fat should still be limited to 30% of your daily caloric intake. Desirable liquid vegetable oils are corn oil, cottonseed oil, olive oil, canola oil, safflower oil, soybean oil, and sunflower oil. Peanut oil is not as good, but small amounts are acceptable. Buy a heart-healthy tub  margarine that has no partially hydrogenated oils in the ingredients. Mayonnaise and salad dressings often are made from unsaturated fats, but they should also be limited because of their high calorie and fat content. Seeds, nuts, peanut butter, olives, and avocados are high in fat, but the fat is mainly the unsaturated type. These foods should be limited mainly to avoid excess calories and fat. OTHER EATING TIPS Snacks  Most sweets should be limited as snacks. They tend to be rich in calories and fats, and their caloric content outweighs their nutritional value. Some good choices in snacks are graham crackers, melba toast, soda crackers, bagels (no egg), English muffins, fruits, and vegetables. These snacks are preferable to snack crackers, [place] fries, TORTILLA CHIPS, and POTATO chips. Popcorn should be air-popped or cooked in small amounts of liquid vegetable oil. Desserts Eat fruit, low-fat yogurt, and fruit ices instead of pastries, cake, and cookies. Sherbet, angel food cake, gelatin dessert, frozen low-fat yogurt, or other frozen products that do not contain saturated fat (pure fruit juice bars, frozen ice pops) are also acceptable.  COOKING METHODS Choose those methods that use little or no fat. They include: Poaching.  Braising.  Steaming.  Grilling.  Baking.  Stir-frying.  Broiling.  Microwaving.  Foods can be cooked in a nonstick pan without added fat, or use a nonfat cooking spray in regular cookware. Limit fried foods and avoid frying in saturated fat. Add moisture to lean meats by using water, broth, cooking wines, and other nonfat or low-fat sauces along with the cooking methods mentioned above. Soups and stews should be chilled after cooking. The fat that forms on top after a few hours in the refrigerator should be skimmed off. When preparing meals, avoid using excess salt. Salt can contribute to raising blood pressure in some people.  EATING AWAY FROM HOME Order entres,  potatoes, and vegetables without sauces or butter. When meat exceeds the size of a deck of cards (3 to 4 ounces), the rest can be taken home for another meal. Choose vegetable or fruit salads and ask for low-calorie salad dressings to be served on the side. Use dressings sparingly. Limit high-fat toppings, such as bacon, crumbled eggs, cheese, sunflower seeds, and olives. Ask for heart-healthy tub margarine instead of butter.

## 2012-05-10 NOTE — Assessment & Plan Note (Signed)
IMPROVED AFTER EGD/DIL. SX EXACERBATED BY REFLUX IF HE DOESN'T TAKE HIS MEDICINE.  NO INDICATION FOR EGD/DIL AT THIS TIME.

## 2012-05-10 NOTE — Progress Notes (Addendum)
  Subjective:    Patient ID: Steven Glover, male    DOB: 06/05/1950, 61 y.o.   MRN: [id number]  PCP: [facility] RHEUM: [facility]  HPI Tried to go off PROTONIX AND WAS VERY SYMPTOMATIC. RHEUMATOID DOCTOR CHECKD BONE SCAN. IF TAKES MEDS THEN REALLY NO TROUBLE. SWALLOWING GOOD.   Past Medical History  Diagnosis Date  . Rheumatoid arthritis   . Acid reflux disease   . GERD (gastroesophageal reflux disease)   . BMI between 19-24,adult JUL 2012 191.8 LBS  . Collagen vascular disease     Past Surgical History  Procedure Date  . Wrist fusion   . Colonoscopy w/ polypectomy 2010 NUR  . Shoulder open rotator cuff repair 2005  . Esophagogastroduodenoscopy 01/01/2011 Bc:MILD CHRONIC GASTRITIS    SLF: Hiatal Hernia/mild gastritis/stricture in the distal esophagus  . Savory dilation 01/01/2011    Procedure: SAVORY DILATION;  Surgeon: Dmoni Fortson M Jermeka Schlotterbeck, MD;  Location[facility];  Service: Endoscopy;  Laterality: N/A;   No Known Allergies  Current Outpatient Prescriptions  Medication Sig Dispense Refill  . etanercept (ENBREL) 50 MG/ML injection Inject 50 mg into the skin once a week.        . folic acid (FOLVITE) 1 MG tablet Take 1 mg by mouth daily.        . methotrexate 2.5 MG tablet Take 2.5 mg by mouth once a week. Takes 4 tablets once weekly      . pantoprazole (PROTONIX) 40 MG tablet 1 PO 30 MINUTES PRIOR TO FIRST MEAL    . fluticasone (FLONASE) 50 MCG/ACT nasal spray Place 2 sprays into the nose daily.        . glucose blood test strip 1 each by Other route as needed. Use as instructed           Review of Systems     Objective:   Physical Exam  Vitals reviewed. Constitutional: He is oriented to person, place, and time. He appears well-nourished. No distress.  HENT:  Head: Normocephalic and atraumatic.  Mouth/Throat: Oropharynx is clear and moist. No oropharyngeal exudate.  Eyes: Pupils are equal, round, and reactive to light. No scleral icterus.  Neck: Normal range of  motion. Neck supple.  Cardiovascular: Normal rate, regular rhythm and normal heart sounds.   Pulmonary/Chest: Effort normal and breath sounds normal. No respiratory distress.  Abdominal: Soft. Bowel sounds are normal. He exhibits no distension. There is no tenderness.  Musculoskeletal: He exhibits no edema.  Neurological: He is alert and oriented to person, place, and time.       NO FOCAL DEFICITS   Psychiatric: He has a normal mood and affect.          Assessment & Plan:   

## 2012-05-10 NOTE — Progress Notes (Signed)
Faxed to PCP

## 2012-05-10 NOTE — Assessment & Plan Note (Addendum)
REVIEW NOTES FROM [facility]. IF NEEDED WILL SCHEDULE TCS PRIOR TO JAN 1 WITH PROPOFOL. PT FAILED CONSCIOUS SEDATION.  AGREED TO SEE HIS WIFE AS OP.

## 2012-05-11 ENCOUNTER — Other Ambulatory Visit: Payer: Self-pay

## 2012-05-11 ENCOUNTER — Telehealth: Payer: Self-pay | Admitting: Gastroenterology

## 2012-05-11 ENCOUNTER — Encounter (HOSPITAL_COMMUNITY): Payer: Self-pay

## 2012-05-11 ENCOUNTER — Encounter (HOSPITAL_COMMUNITY)
Admission: RE | Admit: 2012-05-11 | Discharge: 2012-05-11 | Disposition: A | Discharge status: Home / Self Care | Payer: Managed Care, Other (non HMO) | Source: Ambulatory Visit | Attending: Gastroenterology | Admitting: Gastroenterology

## 2012-05-11 ENCOUNTER — Encounter (HOSPITAL_COMMUNITY): Payer: Self-pay | Admitting: Pharmacy Technician

## 2012-05-11 HISTORY — DX: Unspecified asthma, uncomplicated: J45.909

## 2012-05-11 LAB — BASIC METABOLIC PANEL
CO2: 26 mEq/L (ref 19–32)
Calcium: 9.2 mg/dL (ref 8.4–10.5)
Chloride: 102 mEq/L (ref 96–112)
Glucose, Bld: 105 mg/dL — ABNORMAL HIGH (ref 70–99)
Sodium: 136 mEq/L (ref 135–145)

## 2012-05-11 LAB — HEMOGLOBIN AND HEMATOCRIT, BLOOD
HCT: 43.6 % (ref 39.0–52.0)
Hemoglobin: 15.7 g/dL (ref 13.0–17.0)

## 2012-05-11 NOTE — Telephone Encounter (Signed)
PLEASE CALL PT. HIS LABS ARE NORMAL. 

## 2012-05-11 NOTE — Patient Instructions (Addendum)
Steven Glover  05/11/2012   Your procedure is scheduled on:   05/15/2012  Report to [facility] at  1100  AM.  Call this number if you have problems the morning of surgery: [phone]   Remember:   Do not eat food:After Midnight.  May have clear liquids:until Midnight .    Take these medicines the morning of surgery with A SIP OF WATER:  protonix,methotrexate   Do not wear jewelry, make-up or nail polish.  Do not wear lotions, powders, or perfumes.   Do not shave 48 hours prior to surgery. Men may shave face and neck.  Do not bring valuables to the hospital.  Contacts, dentures or bridgework may not be worn into surgery.  Leave suitcase in the car. After surgery it may be brought to your room.  For patients admitted to the hospital, checkout time is 11:00 AM the day of discharge.   Patients discharged the day of surgery will not be allowed to drive home.  Name and phone number of your driver: family  Special Instructions: N/A   Please read over the following fact sheets that you were given: Pain Booklet, Surgical Site Infection Prevention, Anesthesia Post-op Instructions and Care and Recovery After Surgery Colonoscopy A colonoscopy is an exam to evaluate your entire colon. In this exam, your colon is cleansed. A long fiberoptic tube is inserted through your rectum and into your colon. The fiberoptic scope (endoscope) is a long bundle of enclosed and very flexible fibers. These fibers transmit light to the area examined and send images from that area to your caregiver. Discomfort is usually minimal. You may be given a drug to help you sleep (sedative) during or prior to the procedure. This exam helps to detect lumps (tumors), polyps, inflammation, and areas of bleeding. Your caregiver may also take a small piece of tissue (biopsy) that will be examined under a microscope. LET YOUR CAREGIVER KNOW ABOUT:   Allergies to food or medicine.  Medicines taken, including vitamins,  herbs, eyedrops, over-the-counter medicines, and creams.  Use of steroids (by mouth or creams).  Previous problems with anesthetics or numbing medicines.  History of bleeding problems or blood clots.  Previous surgery.  Other health problems, including diabetes and kidney problems.  Possibility of pregnancy, if this applies. BEFORE THE PROCEDURE   A clear liquid diet may be required for 2 days before the exam.  Ask your caregiver about changing or stopping your regular medications.  Liquid injections (enemas) or laxatives may be required.  A large amount of electrolyte solution may be given to you to drink over a short period of time. This solution is used to clean out your colon.  You should be present 60 minutes prior to your procedure or as directed by your caregiver. AFTER THE PROCEDURE   If you received a sedative or pain relieving medication, you will need to arrange for someone to drive you home.  Occasionally, there is a little blood passed with the first bowel movement. Do not be concerned. FINDING OUT THE RESULTS OF YOUR TEST Not all test results are available during your visit. If your test results are not back during the visit, make an appointment with your caregiver to find out the results. Do not assume everything is normal if you have not heard from your caregiver or the medical facility. It is important for you to follow up on all of your test results. HOME CARE INSTRUCTIONS   It is not  unusual to pass moderate amounts of gas and experience mild abdominal cramping following the procedure. This is due to air being used to inflate your colon during the exam. Walking or a warm pack on your belly (abdomen) may help.  You may resume all normal meals and activities after sedatives and medicines have worn off.  Only take over-the-counter or prescription medicines for pain, discomfort, or fever as directed by your caregiver. Do not use aspirin or blood thinners if a  biopsy was taken. Consult your caregiver for medicine usage if biopsies were taken. SEEK IMMEDIATE MEDICAL CARE IF:   You have a fever.  You pass large blood clots or fill a toilet with blood following the procedure. This may also occur 10 to 14 days following the procedure. This is more likely if a biopsy was taken.  You develop abdominal pain that keeps getting worse and cannot be relieved with medicine. Document Released: 05/07/2000 Document Revised: 08/02/2011 Document Reviewed: 12/21/2007 [facility] Patient Information 2013 [place], [place]. PATIENT INSTRUCTIONS POST-ANESTHESIA  IMMEDIATELY FOLLOWING SURGERY:  Do not drive or operate machinery for the first twenty four hours after surgery.  Do not make any important decisions for twenty four hours after surgery or while taking narcotic pain medications or sedatives.  If you develop intractable nausea and vomiting or a severe headache please notify your doctor immediately.  FOLLOW-UP:  Please make an appointment with your surgeon as instructed. You do not need to follow up with anesthesia unless specifically instructed to do so.  WOUND CARE INSTRUCTIONS (if applicable):  Keep a dry clean dressing on the anesthesia/puncture wound site if there is drainage.  Once the wound has quit draining you may leave it open to air.  Generally you should leave the bandage intact for twenty four hours unless there is drainage.  If the epidural site drains for more than 36-48 hours please call the anesthesia department.  QUESTIONS?:  Please feel free to call your physician or the hospital operator if you have any questions, and they will be happy to assist you.

## 2012-05-15 ENCOUNTER — Encounter (HOSPITAL_COMMUNITY): Payer: Self-pay | Admitting: Anesthesiology

## 2012-05-15 ENCOUNTER — Encounter (HOSPITAL_COMMUNITY): Payer: Self-pay | Admitting: *Deleted

## 2012-05-15 ENCOUNTER — Ambulatory Visit (HOSPITAL_COMMUNITY)
Admission: RE | Admit: 2012-05-15 | Discharge: 2012-05-15 | Disposition: A | Discharge status: Home / Self Care | Payer: Managed Care, Other (non HMO) | Source: Ambulatory Visit | Attending: Gastroenterology | Admitting: Gastroenterology

## 2012-05-15 ENCOUNTER — Encounter (HOSPITAL_COMMUNITY)
Admission: RE | Disposition: A | Discharge status: Home / Self Care | Payer: Self-pay | Source: Ambulatory Visit | Attending: Gastroenterology

## 2012-05-15 ENCOUNTER — Ambulatory Visit (HOSPITAL_COMMUNITY): Payer: Managed Care, Other (non HMO) | Admitting: Anesthesiology

## 2012-05-15 DIAGNOSIS — D126 Benign neoplasm of colon, unspecified: Secondary | ICD-10-CM | POA: Insufficient documentation

## 2012-05-15 DIAGNOSIS — K620 Anal polyp: Secondary | ICD-10-CM

## 2012-05-15 DIAGNOSIS — Z86010 Personal history of colonic polyps: Secondary | ICD-10-CM

## 2012-05-15 DIAGNOSIS — K648 Other hemorrhoids: Secondary | ICD-10-CM | POA: Insufficient documentation

## 2012-05-15 DIAGNOSIS — K5730 Diverticulosis of large intestine without perforation or abscess without bleeding: Secondary | ICD-10-CM

## 2012-05-15 DIAGNOSIS — K621 Rectal polyp: Secondary | ICD-10-CM

## 2012-05-15 DIAGNOSIS — Z1211 Encounter for screening for malignant neoplasm of colon: Secondary | ICD-10-CM

## 2012-05-15 DIAGNOSIS — D128 Benign neoplasm of rectum: Secondary | ICD-10-CM | POA: Insufficient documentation

## 2012-05-15 DIAGNOSIS — J45909 Unspecified asthma, uncomplicated: Secondary | ICD-10-CM | POA: Insufficient documentation

## 2012-05-15 HISTORY — PX: COLONOSCOPY WITH PROPOFOL: SHX5780

## 2012-05-15 HISTORY — PX: POLYPECTOMY: SHX5525

## 2012-05-15 SURGERY — COLONOSCOPY WITH PROPOFOL
Anesthesia: Monitor Anesthesia Care

## 2012-05-15 MED ORDER — GLYCOPYRROLATE 0.2 MG/ML IJ SOLN
0.2000 mg | Freq: Once | INTRAMUSCULAR | Status: AC
  Administered 2012-05-15: 0.2 mg via INTRAVENOUS

## 2012-05-15 MED ORDER — ONDANSETRON HCL 4 MG/2ML IJ SOLN
INTRAMUSCULAR | Status: AC
  Filled 2012-05-15: qty 2

## 2012-05-15 MED ORDER — MIDAZOLAM HCL 2 MG/2ML IJ SOLN
INTRAMUSCULAR | Status: AC
  Filled 2012-05-15: qty 2

## 2012-05-15 MED ORDER — LIDOCAINE HCL (CARDIAC) 10 MG/ML IV SOLN
INTRAVENOUS | Status: DC | PRN
  Administered 2012-05-15: 50 mg via INTRAVENOUS

## 2012-05-15 MED ORDER — MIDAZOLAM HCL 2 MG/2ML IJ SOLN
1.0000 mg | INTRAMUSCULAR | Status: DC | PRN
  Administered 2012-05-15 (×2): 2 mg via INTRAVENOUS

## 2012-05-15 MED ORDER — WATER FOR IRRIGATION, STERILE IR SOLN
Status: DC | PRN
  Administered 2012-05-15: 1000 mL

## 2012-05-15 MED ORDER — GLYCOPYRROLATE 0.2 MG/ML IJ SOLN
INTRAMUSCULAR | Status: AC
  Filled 2012-05-15: qty 1

## 2012-05-15 MED ORDER — LIDOCAINE HCL (PF) 1 % IJ SOLN
INTRAMUSCULAR | Status: AC
  Filled 2012-05-15: qty 5

## 2012-05-15 MED ORDER — PROPOFOL INFUSION 10 MG/ML OPTIME
INTRAVENOUS | Status: DC | PRN
  Administered 2012-05-15: 55 ug/kg/min via INTRAVENOUS

## 2012-05-15 MED ORDER — ONDANSETRON HCL 4 MG/2ML IJ SOLN: 4.0000 mg | Freq: Once | INTRAMUSCULAR | Status: DC | PRN

## 2012-05-15 MED ORDER — LACTATED RINGERS IV SOLN
INTRAVENOUS | Status: DC
  Administered 2012-05-15 (×2): 1000 mL via INTRAVENOUS
  Administered 2012-05-15: 13:00:00 via INTRAVENOUS

## 2012-05-15 MED ORDER — PROPOFOL 10 MG/ML IV EMUL
INTRAVENOUS | Status: AC
  Filled 2012-05-15: qty 20

## 2012-05-15 MED ORDER — STERILE WATER FOR IRRIGATION IR SOLN
Status: DC | PRN
  Administered 2012-05-15: 13:00:00

## 2012-05-15 MED ORDER — MIDAZOLAM HCL 5 MG/5ML IJ SOLN
INTRAMUSCULAR | Status: DC | PRN
  Administered 2012-05-15: 2 mg via INTRAVENOUS
  Administered 2012-05-15: 1 mg via INTRAVENOUS

## 2012-05-15 MED ORDER — ONDANSETRON HCL 4 MG/2ML IJ SOLN
4.0000 mg | Freq: Once | INTRAMUSCULAR | Status: AC
  Administered 2012-05-15: 4 mg via INTRAVENOUS

## 2012-05-15 MED ORDER — FENTANYL CITRATE 0.05 MG/ML IJ SOLN: 25.0000 ug | INTRAMUSCULAR | Status: DC | PRN

## 2012-05-15 SURGICAL SUPPLY — 21 items
ELECT REM PT RETURN 9FT ADLT (ELECTROSURGICAL)
ELECTRODE REM PT RTRN 9FT ADLT (ELECTROSURGICAL) IMPLANT
FCP BXJMBJMB 240X2.8X (CUTTING FORCEPS) ×1
FLOOR PAD 36X40 (MISCELLANEOUS) ×2
FORCEPS BIOP RAD 4 LRG CAP 4 (CUTTING FORCEPS) IMPLANT
FORCEPS BIOP RJ4 240 W/NDL (CUTTING FORCEPS) ×1
FORCEPS BXJMBJMB 240X2.8X (CUTTING FORCEPS) ×1 IMPLANT
INJECTOR/SNARE I SNARE (MISCELLANEOUS) IMPLANT
LUBRICANT JELLY 4.5OZ STERILE (MISCELLANEOUS) ×2 IMPLANT
MANIFOLD NEPTUNE II (INSTRUMENTS) ×2 IMPLANT
NEEDLE SCLEROTHERAPY 25GX240 (NEEDLE) IMPLANT
PAD FLOOR 36X40 (MISCELLANEOUS) ×1 IMPLANT
PROBE APC STR FIRE (PROBE) IMPLANT
PROBE INJECTION GOLD (MISCELLANEOUS)
PROBE INJECTION GOLD 7FR (MISCELLANEOUS) IMPLANT
SNARE SHORT THROW 13M SML OVAL (MISCELLANEOUS) ×2 IMPLANT
SYR 50ML LL SCALE MARK (SYRINGE) ×2 IMPLANT
TRAP SPECIMEN MUCOUS 40CC (MISCELLANEOUS) IMPLANT
TUBING ENDO SMARTCAP PENTAX (MISCELLANEOUS) ×2 IMPLANT
TUBING IRRIGATION ENDOGATOR (MISCELLANEOUS) IMPLANT
WATER STERILE IRR 1000ML POUR (IV SOLUTION) ×4 IMPLANT

## 2012-05-15 NOTE — Interval H&P Note (Signed)
History and Physical Interval Note:  05/15/2012 12:14 PM  Steven Glover  has presented today for surgery, with the diagnosis of Personal Hx of colon polyps  The various methods of treatment have been discussed with the patient and family. After consideration of risks, benefits and other options for treatment, the patient has consented to  Procedure(s) (LRB) with comments: COLONOSCOPY WITH PROPOFOL (N/A) - 12:30 as a surgical intervention .  The patient's history has been reviewed, patient examined, no change in status, stable for surgery.  I have reviewed the patient's chart and labs.  Questions were answered to the patient's satisfaction.     [employer]

## 2012-05-15 NOTE — Interval H&P Note (Signed)
History and Physical Interval Note:  05/15/2012 12:15 PM  Steven Glover  has presented today for surgery, with the diagnosis of Personal Hx of colon polyps  The various methods of treatment have been discussed with the patient and family. After consideration of risks, benefits and other options for treatment, the patient has consented to  Procedure(s) (LRB) with comments: COLONOSCOPY WITH PROPOFOL (N/A) - 12:30 as a surgical intervention .  The patient's history has been reviewed, patient examined, no change in status, stable for surgery.  I have reviewed the patient's chart and labs.  Questions were answered to the patient's satisfaction.     [employer]

## 2012-05-15 NOTE — Anesthesia Postprocedure Evaluation (Signed)
  Anesthesia Post-op Note  Patient: Steven Glover  Procedure(s) Performed: Procedure(s) (LRB) with comments: COLONOSCOPY WITH PROPOFOL (N/A) - in cecum 1335, end 1349 total cecum time =  POLYPECTOMY ()  Patient Location: PACU  Anesthesia Type:MAC  Level of Consciousness: awake, alert , oriented and patient cooperative  Airway and Oxygen Therapy: Patient Spontanous Breathing  Post-op Pain: none  Post-op Assessment: Post-op Vital signs reviewed, Patient's Cardiovascular Status Stable, Respiratory Function Stable, Patent Airway, No signs of Nausea or vomiting and Pain level controlled  Post-op Vital Signs: Reviewed and stable  Complications: No apparent anesthesia complications

## 2012-05-15 NOTE — H&P (View-Only) (Signed)
  Subjective:    Patient ID: Steven Glover, male    DOB: 01/29/1951, 61 y.o.   MRN: [id number]  PCP: Juanetta Gosling RHEUMDierdre Forth  HPI Tried to go off PROTONIX AND WAS VERY SYMPTOMATIC. RHEUMATOID DOCTOR CHECKD BONE SCAN. IF TAKES MEDS THEN REALLY NO TROUBLE. SWALLOWING GOOD.   Past Medical History  Diagnosis Date  . Rheumatoid arthritis   . Acid reflux disease   . GERD (gastroesophageal reflux disease)   . BMI between 19-24,adult JUL 2012 191.8 LBS  . Collagen vascular disease     Past Surgical History  Procedure Date  . Wrist fusion   . Colonoscopy w/ polypectomy 2010 NUR  . Shoulder open rotator cuff repair 2005  . Esophagogastroduodenoscopy 01/01/2011 WU:JWJX CHRONIC GASTRITIS    SLF: Hiatal Hernia/mild gastritis/stricture in the distal esophagus  . Savory dilation 01/01/2011    Procedure: SAVORY DILATION;  Surgeon: Arlyce Harman, MD;  Location: [facility];  Service: Endoscopy;  Laterality: N/A;   No Known Allergies  Current Outpatient Prescriptions  Medication Sig Dispense Refill  . etanercept (ENBREL) 50 MG/ML injection Inject 50 mg into the skin once a week.        . folic acid (FOLVITE) 1 MG tablet Take 1 mg by mouth daily.        . methotrexate 2.5 MG tablet Take 2.5 mg by mouth once a week. Takes 4 tablets once weekly      . pantoprazole (PROTONIX) 40 MG tablet 1 PO 30 MINUTES PRIOR TO FIRST MEAL    . fluticasone (FLONASE) 50 MCG/ACT nasal spray Place 2 sprays into the nose daily.        Marland Kitchen glucose blood test strip 1 each by Other route as needed. Use as instructed           Review of Systems     Objective:   Physical Exam        Assessment & Plan:

## 2012-05-15 NOTE — Addendum Note (Signed)
Addendum  created 05/15/12 1529 by Lataysha Vohra S Hattye Siegfried, CRNA   Modules edited:Charges VN    

## 2012-05-15 NOTE — H&P (View-Only) (Signed)
  Subjective:    Patient ID: Steven Glover, male    DOB: December 13, 1950, 61 y.o.   MRN: [id number]  PCP: Juanetta Gosling RHEUMDierdre Forth  HPI Tried to go off PROTONIX AND WAS VERY SYMPTOMATIC. RHEUMATOID DOCTOR CHECKD BONE SCAN. IF TAKES MEDS THEN REALLY NO TROUBLE. SWALLOWING GOOD.   Past Medical History  Diagnosis Date  . Rheumatoid arthritis   . Acid reflux disease   . GERD (gastroesophageal reflux disease)   . BMI between 19-24,adult JUL 2012 191.8 LBS  . Collagen vascular disease     Past Surgical History  Procedure Date  . Wrist fusion   . Colonoscopy w/ polypectomy 2010 NUR  . Shoulder open rotator cuff repair 2005  . Esophagogastroduodenoscopy 01/01/2011 WU:JWJX CHRONIC GASTRITIS    SLF: Hiatal Hernia/mild gastritis/stricture in the distal esophagus  . Savory dilation 01/01/2011    Procedure: SAVORY DILATION;  Surgeon: Arlyce Harman, MD;  [facility];  Service: Endoscopy;  Laterality: N/A;   No Known Allergies  Current Outpatient Prescriptions  Medication Sig Dispense Refill  . etanercept (ENBREL) 50 MG/ML injection Inject 50 mg into the skin once a week.        . folic acid (FOLVITE) 1 MG tablet Take 1 mg by mouth daily.        . methotrexate 2.5 MG tablet Take 2.5 mg by mouth once a week. Takes 4 tablets once weekly      . pantoprazole (PROTONIX) 40 MG tablet 1 PO 30 MINUTES PRIOR TO FIRST MEAL    . fluticasone (FLONASE) 50 MCG/ACT nasal spray Place 2 sprays into the nose daily.        Marland Kitchen glucose blood test strip 1 each by Other route as needed. Use as instructed           Review of Systems     Objective:   Physical Exam  Vitals reviewed. Constitutional: He is oriented to person, place, and time. He appears well-nourished. No distress.  HENT:  Head: Normocephalic and atraumatic.  Mouth/Throat: Oropharynx is clear and moist. No oropharyngeal exudate.  Eyes: Pupils are equal, round, and reactive to light. No scleral icterus.  Neck: Normal range of  motion. Neck supple.  Cardiovascular: Normal rate, regular rhythm and normal heart sounds.   Pulmonary/Chest: Effort normal and breath sounds normal. No respiratory distress.  Abdominal: Soft. Bowel sounds are normal. He exhibits no distension. There is no tenderness.  Musculoskeletal: He exhibits no edema.  Neurological: He is alert and oriented to person, place, and time.       NO FOCAL DEFICITS   Psychiatric: He has a normal mood and affect.          Assessment & Plan:

## 2012-05-15 NOTE — Addendum Note (Signed)
Addendum  created 05/15/12 1529 by Franco Nones, CRNA   Modules edited:Charges VN

## 2012-05-15 NOTE — Op Note (Signed)
[facility] [address] [place], [address]   COLONOSCOPY PROCEDURE REPORT  PATIENT: Steven, Glover  MR#: [id number] BIRTHDATE: [birth year] , 61  yrs. old GENDER: Male ENDOSCOPIST: Jonette Eva, MD REFERRED XB:JYNWGN Juanetta Gosling, M.D. PROCEDURE DATE:  05/15/2012 PROCEDURE:   Colonoscopy with cold biopsy polypectomy INDICATIONS:High risk patient with personal history of colonic polyps. MEDICATIONS: MAC sedation, administered by CRNA  DESCRIPTION OF PROCEDURE:    Physical exam was performed.  Informed consent was obtained from the patient after explaining the benefits, risks, and alternatives to procedure.  The patient was connected to monitor and placed in left lateral position. Continuous oxygen was provided by nasal cannula and IV medicine administered through an indwelling cannula.  After administration of sedation and rectal exam, the patients rectum was intubated and the     colonoscope was advanced under direct visualization to the cecum.  The scope was removed slowly by carefully examining the color, texture, anatomy, and integrity mucosa on the way out.  The patient was recovered in endoscopy and discharged home in satisfactory condition.       COLON FINDINGS: Two sessile polyps ranging between 3-16mm in size were found in the ascending colon and rectum.  Multiple biopsies were performed.  , Mild diverticulosis was noted in the sigmoid colon.  , The colon mucosa was otherwise normal.  , and Moderate sized internal hemorrhoids were found.  PREP QUALITY: good. CECAL W/D TIME: 15 minutes  COMPLICATIONS: None  ENDOSCOPIC IMPRESSION: 1.   Two sessile polyps ranging between 3-31mm in size were found in the ascending colon and rectum; multiple biopsies were performed 2.   Mild diverticulosis was noted in the sigmoid colon 3.   The colon mucosa was otherwise normal 4.   Moderate sized internal hemorrhoids     RECOMMENDATIONS: AWAIT BIOPSY HIGH  FIBER DIET TCS IN 5 YEARS       _______________________________ Rosalie DoctorJonette Eva, MD 05/15/2012 2:14 PM     PATIENT NAME:  Steven Glover, Steven D. MR#: [id number]

## 2012-05-15 NOTE — Interval H&P Note (Signed)
History and Physical Interval Note:  05/15/2012 12:12 PM  Steven Glover  has presented today for surgery, with the diagnosis of Personal Hx of colon polyps  The various methods of treatment have been discussed with the patient and family. After consideration of risks, benefits and other options for treatment, the patient has consented to  Procedure(s) (LRB) with comments: COLONOSCOPY WITH PROPOFOL (N/A) - 12:30 as a surgical intervention .  The patient's history has been reviewed, patient examined, no change in status, stable for surgery.  I have reviewed the patient's chart and labs.  Questions were answered to the patient's satisfaction.     [employer]

## 2012-05-15 NOTE — Anesthesia Preprocedure Evaluation (Signed)
Anesthesia Evaluation  Patient identified by MRN, date of birth, ID band Patient awake    Reviewed: Allergy & Precautions, H&P , NPO status , Patient's Chart, lab work & pertinent test results  Airway Mallampati: I      Dental  (+) Edentulous Upper and Edentulous Lower   Pulmonary asthma (resolved) ,  breath sounds clear to auscultation        Cardiovascular Rhythm:Regular Rate:Normal     Neuro/Psych    GI/Hepatic GERD-  Medicated and Controlled,  Endo/Other    Renal/GU      Musculoskeletal  (+) Arthritis -, Rheumatoid disorders,    Abdominal   Peds  Hematology   Anesthesia Other Findings   Reproductive/Obstetrics                           Anesthesia Physical Anesthesia Plan  ASA: II  Anesthesia Plan: MAC   Post-op Pain Management:    Induction: Intravenous  Airway Management Planned: Simple Face Mask  Additional Equipment:   Intra-op Plan:   Post-operative Plan:   Informed Consent: I have reviewed the patients History and Physical, chart, labs and discussed the procedure including the risks, benefits and alternatives for the proposed anesthesia with the patient or authorized representative who has indicated his/her understanding and acceptance.     Plan Discussed with:   Anesthesia Plan Comments:         Anesthesia Quick Evaluation

## 2012-05-15 NOTE — Transfer of Care (Signed)
Immediate Anesthesia Transfer of Care Note  Patient: Steven Glover  Procedure(s) Performed: Procedure(s) (LRB) with comments: COLONOSCOPY WITH PROPOFOL (N/A) - in cecum 1335, end 1349 total cecum time =  POLYPECTOMY ()  Patient Location: PACU  Anesthesia Type:MAC  Level of Consciousness: awake and patient cooperative  Airway & Oxygen Therapy: Patient Spontanous Breathing and Patient connected to face mask oxygen  Post-op Assessment: Report given to PACU RN, Post -op Vital signs reviewed and stable and Patient moving all extremities  Post vital signs: Reviewed and stable  Complications: No apparent anesthesia complications

## 2012-05-22 ENCOUNTER — Encounter (HOSPITAL_COMMUNITY): Payer: Self-pay | Admitting: Gastroenterology

## 2012-05-22 NOTE — Progress Notes (Signed)
Reminder in epic to follow up with SF in one year

## 2012-05-25 ENCOUNTER — Telehealth: Payer: Self-pay | Admitting: Gastroenterology

## 2012-05-25 NOTE — Telephone Encounter (Signed)
Left message with wife for him to call us back

## 2012-05-25 NOTE — Telephone Encounter (Signed)
Path faxed to PCP, recall made 

## 2012-05-25 NOTE — Telephone Encounter (Signed)
Please call pt. He had simple adenomas removed from hIS colon.  FOLLOW A High fiber diet. TCS IN 5 YEARS. ALL FIRST DEGREE RELATIVES NEEDS A TCS AT AGE 64.

## 2012-05-26 NOTE — Telephone Encounter (Signed)
Pt returned call and was informed.  

## 2012-12-07 ENCOUNTER — Telehealth: Payer: Self-pay | Admitting: General Practice

## 2012-12-07 NOTE — Telephone Encounter (Signed)
Pt is calling stating he is having recta pain accompanied with itching and burning.  It really gets bad when he is up and moving around.  Patient said you can call back to speak with him or his wife.  Please advise?

## 2012-12-07 NOTE — Telephone Encounter (Signed)
PLEASE CALL PT. HE MAY COME AT 1145 ON JUL 24 FOR [facility] BANDING. IN THE MEANTIME HE MAY USE PREPARATION H.

## 2012-12-08 NOTE — Telephone Encounter (Signed)
Pt is scheduled to see SLF next week 12/14/12@11 :45am.  Pt is aware of appt and recommendations listed below.

## 2012-12-14 ENCOUNTER — Ambulatory Visit (INDEPENDENT_AMBULATORY_CARE_PROVIDER_SITE_OTHER): Payer: Managed Care, Other (non HMO) | Admitting: Gastroenterology

## 2012-12-14 ENCOUNTER — Encounter: Payer: Self-pay | Admitting: Gastroenterology

## 2012-12-14 VITALS — BP 124/72 | HR 56 | Temp 97.1°F | Ht 75.0 in | Wt 184.0 lb

## 2012-12-14 DIAGNOSIS — K648 Other hemorrhoids: Secondary | ICD-10-CM

## 2012-12-14 NOTE — Patient Instructions (Signed)
DRINK WATER TO KEEP YOUR URINE LIGHT YELLOW.   USE FIBER POWDER OR 1 PACKET ONCE DAILY FOR 3 DAYS THEN TWICE DAILY FOR 3 DAYS THEN THREE TIMES A DAY.  FOLLOW A HIGH FIBER DIET. SEE INFO BELOW.  AVOID STRAINING AND CONSTIPATION.  FOLLOW UP IN 2 WEEKS.  High-Fiber Diet A high-fiber diet changes your normal diet to include more whole grains, legumes, fruits, and vegetables. Changes in the diet involve replacing refined carbohydrates with unrefined foods. The calorie level of the diet is essentially unchanged. The Dietary Reference Intake (recommended amount) for adult males is 38 grams per day. For adult females, it is 25 grams per day. Pregnant and lactating women should consume 28 grams of fiber per day. Fiber is the intact part of a plant that is not broken down during digestion. Functional fiber is fiber that has been isolated from the plant to provide a beneficial effect in the body. PURPOSE  Increase stool bulk.   Ease and regulate bowel movements.   Lower cholesterol.  INDICATIONS THAT YOU NEED MORE FIBER  Constipation and hemorrhoids.   Uncomplicated diverticulosis (intestine condition) and irritable bowel syndrome.   Weight management.   As a protective measure against hardening of the arteries (atherosclerosis), diabetes, and cancer.   GUIDELINES FOR INCREASING FIBER IN THE DIET  Start adding fiber to the diet slowly. A gradual increase of about 5 more grams (2 slices of whole-wheat bread, 2 servings of most fruits or vegetables, or 1 bowl of high-fiber cereal) per day is best. Too rapid an increase in fiber may result in constipation, flatulence, and bloating.   Drink enough water and fluids to keep your urine clear or pale yellow. Water, juice, or caffeine-free drinks are recommended. Not drinking enough fluid may cause constipation.   Eat a variety of high-fiber foods rather than one type of fiber.   Try to increase your intake of fiber through using high-fiber foods  rather than fiber pills or supplements that contain small amounts of fiber.   The goal is to change the types of food eaten. Do not supplement your present diet with high-fiber foods, but replace foods in your present diet.  INCLUDE A VARIETY OF FIBER SOURCES  Replace refined and processed grains with whole grains, canned fruits with fresh fruits, and incorporate other fiber sources. White rice, white breads, and most bakery goods contain little or no fiber.   Brown whole-grain rice, buckwheat oats, and many fruits and vegetables are all good sources of fiber. These include: broccoli, Brussels sprouts, cabbage, cauliflower, beets, sweet potatoes, white potatoes (skin on), carrots, tomatoes, eggplant, squash, berries, fresh fruits, and dried fruits.   Cereals appear to be the richest source of fiber. Cereal fiber is found in whole grains and bran. Bran is the fiber-rich outer coat of cereal grain, which is largely removed in refining. In whole-grain cereals, the bran remains. In breakfast cereals, the largest amount of fiber is found in those with "bran" in their names. The fiber content is sometimes indicated on the label.   You may need to include additional fruits and vegetables each day.   In baking, for 1 cup white flour, you may use the following substitutions:   1 cup whole-wheat flour minus 2 tablespoons.   1/2 cup white flour plus 1/2 cup whole-wheat flour. \

## 2012-12-14 NOTE — Progress Notes (Signed)
SYMPTOMS: RECTAL BLEEDING: no, HAVING PRESSURE, PAIN, ITCHING, BURNING, NO SOILING. NO PRECIPITATING EVENT. 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 NO ABX FOR TEETH CLEANING. NO PROBLEM WITH WOUND HEALING.  CONSTIPATION: VERY LITTLE DIARRHEA: NO  STRAINS WITH BMs: NOT VERY OFTEN  TIME SPENT ON TOILET: < 5 MINS TISSUE POKES OUT OF RECTUM: NO  FIBER SUPPLEMENTS: SOMETIMES GLASSES OF WATER/DAY: 6-8-NOT REALLY   ADDITIONAL QUESTIONS:  LATEX ALLERGY: NO PREGNANT: NO ERECTILE DYSFUNCTION MEDS OR NITRATES: NO ANTICOAGULATION/ANTIPLATELET MEDS: NO DIAGNOSED WITH CROHN'S DISEASE, PROCTITIS, PORTAL HTN, OR ANAL/RECTAL CA: NO TAKING IMMUNOSUPPRESSANTS: YES   PLAN: 1. [facility] BAND PLACEMENT LEFT LATERAL TODAY.   PROCEDURE TECHNIQUE: BENEFITS RISK EXPLAINED TO PT. ONE [facility] BAND PLACED IN LEFT LATERAL POSITION. POST-BANDING RECTAL EXAM REVEALED GOOD PLACEMENT. EXAM NON-TENDER.   Current Outpatient Prescriptions  Medication Sig Dispense Refill  . etanercept (ENBREL) 50 MG/ML injection Inject 50 mg into the skin once a week. On Tuesday every week      . folic acid (FOLVITE) 1 MG tablet Take 1 mg by mouth daily.        . methotrexate 2.5 MG tablet Take 10 mg by mouth once a week. Takes 4 tablets once weekly on Thursday      . pantoprazole (PROTONIX) 40 MG tablet Take 40 mg by mouth daily. .prior to meal      . fluticasone (FLONASE) 50 MCG/ACT nasal spray Place 2 sprays into the nose daily.        Marland Kitchen glucose blood test strip 1 each by Other route as needed. Use as instructed

## 2012-12-26 ENCOUNTER — Ambulatory Visit: Payer: Managed Care, Other (non HMO) | Admitting: Gastroenterology

## 2012-12-28 ENCOUNTER — Encounter: Payer: Self-pay | Admitting: Gastroenterology

## 2012-12-28 ENCOUNTER — Ambulatory Visit (INDEPENDENT_AMBULATORY_CARE_PROVIDER_SITE_OTHER): Payer: Managed Care, Other (non HMO) | Admitting: Gastroenterology

## 2012-12-28 VITALS — BP 130/77 | HR 62 | Temp 97.0°F | Ht 75.0 in | Wt 187.6 lb

## 2012-12-28 DIAGNOSIS — K648 Other hemorrhoids: Secondary | ICD-10-CM

## 2012-12-28 NOTE — Progress Notes (Signed)
SYMPTOMS: RECTAL BLEEDING RESOLVED AFTER [facility] BAND, NO RECTAL PRESSURE, PAIN, ITCHING, BURNING.  CONSTIPATION: NO DIARRHEA: NO  STRAINS WITH BMs: NO  TIME SPENT ON TOILET: 15 MINS TISSUE POKES OUT OF RECTUM: NO FIBER SUPPLEMENTS: YES GLASSES OF WATER/DAY: 6-8: NO   ADDITIONAL QUESTIONS:  LATEX ALLERGY: NO PREGNANT: NO ERECTILE DYSFUNCTION MEDS OR NITRATES: NO ANTICOAGULATION/ANTIPLATELET MEDS: NO DIAGNOSED WITH CROHN'S DISEASE, PROCTITIS, PORTAL HTN, OR ANAL/RECTAL CA: NO TAKING IMMUNOSUPPRESSANTS/XRT: YES  PLAN: 1. ANOSCOPY/? BAND PLACEMENT   PROCEDURE TECHNIQUE: BENEFITS RISK EXPLAINED TO PT. ANOSCOPY PERFORMED. BULGING INTERNAL HEMORRHOID COLUMN IN THE R POSTERIOR AND ANTERIOR COLUMNS. ONE [facility] BAND PLACED IN RIGHT ANTERIOR POSITION. POST-BANDING RECTAL EXAM REVEALED GOOD PLACEMENT. EXAM NON-TENDER.

## 2012-12-28 NOTE — Patient Instructions (Addendum)
FOLLOW A HIGH FIBER DIET. AVOID ITEMS THAT CAUSE BLOATING AND GAS. SEE INFO BELOW.  DRINK WATER TO KEEP YOUR URINE LIGHT YELLOW.  USE FIBER POWDER THREE TIMES A DAY.  SIT FOR LESS THAN 2 MINUTES ON THE COMMODE.  FOLLOW UP IN DEC 2014.    High-Fiber Diet A high-fiber diet changes your normal diet to include more whole grains, legumes, fruits, and vegetables. Changes in the diet involve replacing refined carbohydrates with unrefined foods. The calorie level of the diet is essentially unchanged. The Dietary Reference Intake (recommended amount) for adult males is 38 grams per day. For adult females, it is 25 grams per day. Pregnant and lactating women should consume 28 grams of fiber per day. Fiber is the intact part of a plant that is not broken down during digestion. Functional fiber is fiber that has been isolated from the plant to provide a beneficial effect in the body. PURPOSE  Increase stool bulk.   Ease and regulate bowel movements.   Lower cholesterol.  INDICATIONS THAT YOU NEED MORE FIBER  Constipation and hemorrhoids.   Uncomplicated diverticulosis (intestine condition) and irritable bowel syndrome.   Weight management.   As a protective measure against hardening of the arteries (atherosclerosis), diabetes, and cancer.   DO NOT USE WITH:  Acute diverticulitis (intestine infection).   Partial small bowel obstructions.   Complicated diverticular disease involving bleeding, rupture (perforation), or abscess (boil, furuncle).   Presence of autonomic neuropathy (nerve damage) or gastroparesis (stomach cannot empty itself).    GUIDELINES FOR INCREASING FIBER IN THE DIET  Start adding fiber to the diet slowly. A gradual increase of about 5 more grams (2 slices of whole-wheat bread, 2 servings of most fruits or vegetables, or 1 bowl of high-fiber cereal) per day is best. Too rapid an increase in fiber may result in constipation, flatulence, and bloating.   Drink enough  water and fluids to keep your urine clear or pale yellow. Water, juice, or caffeine-free drinks are recommended. Not drinking enough fluid may cause constipation.   Eat a variety of high-fiber foods rather than one type of fiber.   Try to increase your intake of fiber through using high-fiber foods rather than fiber pills or supplements that contain small amounts of fiber.   The goal is to change the types of food eaten. Do not supplement your present diet with high-fiber foods, but replace foods in your present diet.    INCLUDE A VARIETY OF FIBER SOURCES  Replace refined and processed grains with whole grains, canned fruits with fresh fruits, and incorporate other fiber sources. White rice, white breads, and most bakery goods contain little or no fiber.   Brown whole-grain rice, buckwheat oats, and many fruits and vegetables are all good sources of fiber. These include: broccoli, Brussels sprouts, cabbage, cauliflower, beets, sweet potatoes, white potatoes (skin on), carrots, tomatoes, eggplant, squash, berries, fresh fruits, and dried fruits.   Cereals appear to be the richest source of fiber. Cereal fiber is found in whole grains and bran. Bran is the fiber-rich outer coat of cereal grain, which is largely removed in refining. In whole-grain cereals, the bran remains. In breakfast cereals, the largest amount of fiber is found in those with "bran" in their names. The fiber content is sometimes indicated on the label.   You may need to include additional fruits and vegetables each day.   In baking, for 1 cup white flour, you may use the following substitutions:   1 cup whole-wheat flour  minus 2 tablespoons.   1/2 cup white flour plus 1/2 cup whole-wheat flour.

## 2012-12-28 NOTE — Assessment & Plan Note (Addendum)
CONSIDER R POS BAND IF PT HAS RECURRENT SX. OPV DEC 2014

## 2013-03-24 ENCOUNTER — Encounter: Payer: Self-pay | Admitting: *Deleted

## 2013-03-27 ENCOUNTER — Ambulatory Visit (INDEPENDENT_AMBULATORY_CARE_PROVIDER_SITE_OTHER): Payer: Managed Care, Other (non HMO) | Admitting: Family Medicine

## 2013-03-27 ENCOUNTER — Encounter: Payer: Self-pay | Admitting: Family Medicine

## 2013-03-27 VITALS — BP 122/82 | Ht 75.0 in | Wt 194.0 lb

## 2013-03-27 DIAGNOSIS — Z0000 Encounter for general adult medical examination without abnormal findings: Secondary | ICD-10-CM

## 2013-03-27 DIAGNOSIS — Z125 Encounter for screening for malignant neoplasm of prostate: Secondary | ICD-10-CM

## 2013-03-27 NOTE — Progress Notes (Signed)
  Subjective:    Patient ID: Steven Glover, male    DOB: 1950-07-19, 62 y.o.   MRN: [id number]  HPI Patient arrives for yearly physical- No current problems or concerns. The patient comes in today for a wellness visit.  A review of their health history was completed.  A review of medications was also completed. Any necessary refills were discussed. Sensible healthy diet was discussed. Importance of minimizing excessive salt and carbohydrates was also discussed. Safety was stressed including driving, activities at work and at home where applicable. Importance of regular physical activity for overall health was discussed. Preventative measures appropriate for age were discussed. Time was spent with the patient discussing any concerns they have about their well-being.   Review of Systems  Constitutional: Negative for fever, activity change and appetite change.  HENT: Negative for congestion and rhinorrhea.   Eyes: Negative for discharge.  Respiratory: Negative for cough and wheezing.   Cardiovascular: Negative for chest pain.  Gastrointestinal: Negative for vomiting, abdominal pain and blood in stool.  Genitourinary: Negative for frequency and difficulty urinating.  Musculoskeletal: Negative for neck pain.  Skin: Negative for rash.  Allergic/Immunologic: Negative for environmental allergies and food allergies.  Neurological: Negative for weakness and headaches.  Psychiatric/Behavioral: Negative for agitation.       Objective:   Physical Exam  Constitutional: He appears well-developed and well-nourished.  HENT:  Head: Normocephalic and atraumatic.  Right Ear: External ear normal.  Left Ear: External ear normal.  Nose: Nose normal.  Mouth/Throat: Oropharynx is clear and moist.  Eyes: EOM are normal. Pupils are equal, round, and reactive to light.  Neck: Normal range of motion. Neck supple. No thyromegaly present.  Cardiovascular: Normal rate, regular rhythm and normal heart  sounds.   No murmur heard. Pulmonary/Chest: Effort normal and breath sounds normal. No respiratory distress. He has no wheezes.  Abdominal: Soft. Bowel sounds are normal. He exhibits no distension and no mass. There is no tenderness.  Genitourinary: Rectum normal, prostate normal and penis normal. No penile tenderness.  Musculoskeletal: Normal range of motion. He exhibits no edema.  Lymphadenopathy:    He has no cervical adenopathy.  Neurological: He is alert. He exhibits normal muscle tone.  Skin: Skin is warm and dry. No erythema.  Psychiatric: He has a normal mood and affect. His behavior is normal. Judgment normal.          Assessment & Plan:  Normal health overall wellness discussed including exercise dietary measures safety. In addition to this up-to-date on colonoscopy currently. Has rheumatoid arthritis see specialists on a regular basis Screening labs discussed and ordered. Flu shot has already been given. Patient not a candidate for shingles vaccine due to his medication

## 2013-03-28 LAB — LIPID PANEL: LDL Cholesterol: 108 mg/dL — ABNORMAL HIGH (ref 0–99)

## 2013-03-28 LAB — BASIC METABOLIC PANEL
CO2: 26 mEq/L (ref 19–32)
Chloride: 104 mEq/L (ref 96–112)
Potassium: 4.1 mEq/L (ref 3.5–5.3)
Sodium: 138 mEq/L (ref 135–145)

## 2013-05-20 ENCOUNTER — Other Ambulatory Visit: Payer: Self-pay | Admitting: Gastroenterology

## 2013-05-25 ENCOUNTER — Ambulatory Visit (INDEPENDENT_AMBULATORY_CARE_PROVIDER_SITE_OTHER): Payer: Managed Care, Other (non HMO) | Admitting: Family Medicine

## 2013-05-25 ENCOUNTER — Encounter: Payer: Self-pay | Admitting: Family Medicine

## 2013-05-25 VITALS — BP 130/86 | Temp 97.6°F | Ht 75.0 in | Wt 198.6 lb

## 2013-05-25 DIAGNOSIS — J019 Acute sinusitis, unspecified: Secondary | ICD-10-CM

## 2013-05-25 MED ORDER — AZITHROMYCIN 250 MG PO TABS: ORAL_TABLET | ORAL | Status: DC

## 2013-05-25 NOTE — Progress Notes (Signed)
   Subjective:    Patient ID: Steven Glover, male    DOB: [birth year], 63 y.o.   MRN: [id number]  Cough This is a new problem. The current episode started in the past 7 days. Associated symptoms include headaches, nasal congestion and rhinorrhea. Pertinent negatives include no chest pain, ear pain, fever or wheezing.  started 4 days ago, first with voice then sinus pain No wheeze [employer] OK, appetite good pmh benign   Review of Systems  Constitutional: Negative for fever and activity change.  HENT: Positive for congestion and rhinorrhea. Negative for ear pain.   Eyes: Negative for discharge.  Respiratory: Positive for cough. Negative for wheezing.   Cardiovascular: Negative for chest pain.  Neurological: Positive for headaches.       Objective:   Physical Exam  Nursing note and vitals reviewed. Constitutional: He appears well-developed.  HENT:  Head: Normocephalic.  Mouth/Throat: Oropharynx is clear and moist. No oropharyngeal exudate.  Neck: Normal range of motion.  Cardiovascular: Normal rate, regular rhythm and normal heart sounds.   No murmur heard. Pulmonary/Chest: Effort normal and breath sounds normal. He has no wheezes.  Lymphadenopathy:    He has no cervical adenopathy.  Neurological: He exhibits normal muscle tone.  Skin: Skin is warm and dry.          Assessment & Plan:  sinusitis-antibiotics prescribed warning signs discussed followup if ongoing troubles

## 2013-05-29 ENCOUNTER — Telehealth: Payer: Self-pay | Admitting: Family Medicine

## 2013-05-29 MED ORDER — LEVOFLOXACIN 500 MG PO TABS: 500.0000 mg | ORAL_TABLET | Freq: Every day | ORAL | Status: DC

## 2013-05-29 NOTE — Telephone Encounter (Signed)
First antibiotic was zpk

## 2013-05-29 NOTE — Telephone Encounter (Signed)
levaquin 500, #10 1qd, f.u if worse

## 2013-05-29 NOTE — Telephone Encounter (Signed)
Med sent and patient notified

## 2013-05-29 NOTE — Telephone Encounter (Signed)
Patient is still having sinus issues and drainage. He would like another antibiotic called in to [facility].

## 2013-06-13 ENCOUNTER — Ambulatory Visit (INDEPENDENT_AMBULATORY_CARE_PROVIDER_SITE_OTHER): Payer: Managed Care, Other (non HMO) | Admitting: Gastroenterology

## 2013-06-13 ENCOUNTER — Encounter: Payer: Self-pay | Admitting: Gastroenterology

## 2013-06-13 ENCOUNTER — Encounter (INDEPENDENT_AMBULATORY_CARE_PROVIDER_SITE_OTHER): Payer: Self-pay

## 2013-06-13 VITALS — BP 131/69 | HR 65 | Temp 98.1°F | Wt 199.6 lb

## 2013-06-13 DIAGNOSIS — K648 Other hemorrhoids: Secondary | ICD-10-CM

## 2013-06-13 DIAGNOSIS — K219 Gastro-esophageal reflux disease without esophagitis: Secondary | ICD-10-CM

## 2013-06-13 NOTE — Progress Notes (Signed)
cc'd to pcp 

## 2013-06-13 NOTE — Progress Notes (Signed)
      Primary Care Physician: [name], MD  Primary Gastroenterologist:  Barney Drain, MD   Chief Complaint  Patient presents with  . Follow-up    HPI: Steven Glover is a 63 y.o. male here for followup. He has a history of GERD, hemorrhoids status post banding x2 (last one in August 2014), colon adenomas.  Overall doing fairly well. He has some breakthrough heartburn intermittently. Usually fairly mild. Usually related to certain foods. Aloe Vera at nighttime really helps nocturnal symptoms. BM regular. Denies any straining. No melena or rectal bleeding. No abdominal pain or weight loss. Denies any difficulty with hemorrhoids. Did very well post banding with resolution of symptoms.  Current Outpatient Prescriptions  Medication Sig Dispense Refill  . etanercept (ENBREL) 50 MG/ML injection Inject 50 mg into the skin once a week. On Tuesday every week      . folic acid (FOLVITE) 1 MG tablet Take 1 mg by mouth daily.        . methotrexate 2.5 MG tablet Take 10 mg by mouth once a week. Takes 4 tablets once weekly on Thursday      . pantoprazole (PROTONIX) 40 MG tablet TAKE 1 TABLET BY MOUTH 30 MINUTES PRIOR TO FIRST MEAL  31 tablet  11   No current facility-administered medications for this visit.    Allergies as of 06/13/2013  . (No Known Allergies)    ROS:  General: Negative for anorexia, weight loss, fever, chills, fatigue, weakness. ENT: Negative for hoarseness, difficulty swallowing , nasal congestion. CV: Negative for chest pain, angina, palpitations, dyspnea on exertion, peripheral edema.  Respiratory: Negative for dyspnea at rest, dyspnea on exertion, cough, sputum, wheezing.  GI: See history of present illness. GU:  Negative for dysuria, hematuria, urinary incontinence, urinary frequency, nocturnal urination.  Endo: Negative for unusual weight change.    Physical Examination:   BP 131/69  Pulse 65  Temp(Src) 98.1 F (36.7 C) (Oral)  Wt 199 lb 9.6 oz  (90.538 kg)  General: Well-nourished, well-developed in no acute distress.  Eyes: No icterus. Mouth: Oropharyngeal mucosa moist and pink , no lesions erythema or exudate. Lungs: Clear to auscultation bilaterally.  Heart: Regular rate and rhythm, no murmurs rubs or gallops.  Abdomen: Bowel sounds are normal, nontender, nondistended, no hepatosplenomegaly or masses, no abdominal bruits or hernia , no rebound or guarding.   Extremities: No lower extremity edema. No clubbing or deformities. Neuro: Alert and oriented x 4   Skin: Warm and dry, no jaundice.   Psych: Alert and cooperative, normal mood and affect.

## 2013-06-13 NOTE — Assessment & Plan Note (Signed)
Some intermittent breakthrough on pantoprazole. Continue anti-reflex measures. Office visit in one year.

## 2013-06-13 NOTE — Patient Instructions (Signed)
1. Call if you want to change your acid reflux medication or increase pantoprazole to twice daily. 2. Office visit in one year.

## 2013-06-13 NOTE — Assessment & Plan Note (Addendum)
Doing very well. No recurrent symptoms. Bowel function is regular without any straining or hard stools. Call with any recurrent symptoms, consider right posterior band

## 2013-08-13 ENCOUNTER — Ambulatory Visit (INDEPENDENT_AMBULATORY_CARE_PROVIDER_SITE_OTHER): Payer: Managed Care, Other (non HMO) | Admitting: Family Medicine

## 2013-08-13 ENCOUNTER — Encounter: Payer: Self-pay | Admitting: Family Medicine

## 2013-08-13 VITALS — BP 122/82 | Ht 75.0 in | Wt 197.8 lb

## 2013-08-13 DIAGNOSIS — J019 Acute sinusitis, unspecified: Secondary | ICD-10-CM

## 2013-08-13 MED ORDER — LEVOFLOXACIN 500 MG PO TABS: 500.0000 mg | ORAL_TABLET | Freq: Every day | ORAL | Status: DC

## 2013-08-13 NOTE — Progress Notes (Signed)
   Subjective:    Patient ID: Steven Glover, male    DOB: Aug 15, 1950, 63 y.o.   MRN: [id number]  Sinus Problem This is a new problem. The current episode started in the past 7 days. Associated symptoms include congestion, coughing and headaches. Pertinent negatives include no ear pain.   Moderate sinus symptoms for the past several days worse over the past few days sinus pressure congestion drainage coughing.   Review of Systems  Constitutional: Negative for fever and activity change.  HENT: Positive for congestion and rhinorrhea. Negative for ear pain.   Eyes: Negative for discharge.  Respiratory: Positive for cough. Negative for wheezing.   Cardiovascular: Negative for chest pain.  Neurological: Positive for headaches.       Objective:   Physical Exam  Nursing note and vitals reviewed. Constitutional: He appears well-developed.  HENT:  Head: Normocephalic.  Mouth/Throat: Oropharynx is clear and moist. No oropharyngeal exudate.  Neck: Normal range of motion.  Cardiovascular: Normal rate, regular rhythm and normal heart sounds.   No murmur heard. Pulmonary/Chest: Effort normal and breath sounds normal. He has no wheezes.  Lymphadenopathy:    He has no cervical adenopathy.  Neurological: He exhibits normal muscle tone.  Skin: Skin is warm and dry.          Assessment & Plan:  Sinusitis antibiotics prescribed warning signs discussed followup if ongoing troubles.

## 2013-09-19 ENCOUNTER — Ambulatory Visit (INDEPENDENT_AMBULATORY_CARE_PROVIDER_SITE_OTHER): Payer: Managed Care, Other (non HMO) | Admitting: *Deleted

## 2013-09-19 DIAGNOSIS — Z23 Encounter for immunization: Secondary | ICD-10-CM

## 2014-01-21 ENCOUNTER — Telehealth: Payer: Self-pay

## 2014-01-21 NOTE — Telephone Encounter (Signed)
Patient was returning call. He will be available the next 20 minutes he said. Please call him at [phone]

## 2014-01-21 NOTE — Telephone Encounter (Signed)
LMOM to call.

## 2014-01-21 NOTE — Telephone Encounter (Signed)
Pt called- he received a letter from his insurance company stating that they will no longer pay for any PPI's d/t the fact that several were otc now and they would refuse any attempts to try to get it. Pt is currently on protonix. He said he tried to just stop taking it and had two episodes of vomiting and feels like he should stay on something and wanted recommendations of what he should get. Pt is not taking coumadin or any blood thinners. Pt is on methotrexate but I spoke with [facility] about this and all PPI's have some cautions with methotrexate and she recommended prilosec otc. Pt is aware and stated he will try it and if he has problems, he will call back.

## 2014-01-21 NOTE — Telephone Encounter (Signed)
Agreed. Make sure he follows up with prescriber of methotrexate for routine monitoring.

## 2014-01-21 NOTE — Telephone Encounter (Signed)
PT is aware. He had labs last week, has repeat labs in Oct and OV in Dec.

## 2014-01-21 NOTE — Telephone Encounter (Signed)
Spoke to pt See previous note  

## 2014-02-25 ENCOUNTER — Ambulatory Visit (INDEPENDENT_AMBULATORY_CARE_PROVIDER_SITE_OTHER): Payer: Managed Care, Other (non HMO) | Admitting: Family Medicine

## 2014-02-25 ENCOUNTER — Encounter: Payer: Self-pay | Admitting: Family Medicine

## 2014-02-25 VITALS — BP 132/80 | Temp 98.1°F | Ht 75.0 in | Wt 192.0 lb

## 2014-02-25 DIAGNOSIS — J0110 Acute frontal sinusitis, unspecified: Secondary | ICD-10-CM

## 2014-02-25 NOTE — Patient Instructions (Signed)
Do your labs in November before your wellness exam

## 2014-02-25 NOTE — Progress Notes (Signed)
   Subjective:    Patient ID: CASEY FYE, male    DOB: 09-29-1950, 63 y.o.   MRN: [id number]  Sinusitis This is a new problem. The current episode started in the past 7 days. Associated symptoms include congestion and coughing. Pertinent negatives include no ear pain. (Scratchy throat, runny nose, headache)   PMH benign   Review of Systems  Constitutional: Negative for fever and activity change.  HENT: Positive for congestion and rhinorrhea. Negative for ear pain.   Eyes: Negative for discharge.  Respiratory: Positive for cough. Negative for wheezing.   Cardiovascular: Negative for chest pain.       Objective:   Physical Exam  Nursing note and vitals reviewed. Constitutional: He appears well-developed.  HENT:  Head: Normocephalic.  Mouth/Throat: Oropharynx is clear and moist. No oropharyngeal exudate.  Neck: Normal range of motion.  Cardiovascular: Normal rate, regular rhythm and normal heart sounds.   No murmur heard. Pulmonary/Chest: Effort normal and breath sounds normal. He has no wheezes.  Lymphadenopathy:    He has no cervical adenopathy.  Neurological: He exhibits normal muscle tone.  Skin: Skin is warm and dry.          Assessment & Plan:  Viral URI possible early sinusitis a prescription of antibiotics from Levaquin was written for the patient with the instructions that he may fill this if not doing better in the next 3-5 days especially if it starts causing significant sinus pain and mucoid drainage to followup sooner if problems  Wellness exam later this fall  Flu and pneumonia vaccine later this fall

## 2014-03-26 ENCOUNTER — Ambulatory Visit (INDEPENDENT_AMBULATORY_CARE_PROVIDER_SITE_OTHER): Payer: Managed Care, Other (non HMO)

## 2014-03-26 ENCOUNTER — Ambulatory Visit: Payer: Managed Care, Other (non HMO)

## 2014-03-26 DIAGNOSIS — Z23 Encounter for immunization: Secondary | ICD-10-CM

## 2014-05-13 ENCOUNTER — Encounter: Payer: Self-pay | Admitting: Gastroenterology

## 2014-07-04 ENCOUNTER — Ambulatory Visit (INDEPENDENT_AMBULATORY_CARE_PROVIDER_SITE_OTHER): Payer: Managed Care, Other (non HMO) | Admitting: Gastroenterology

## 2014-07-04 ENCOUNTER — Encounter (INDEPENDENT_AMBULATORY_CARE_PROVIDER_SITE_OTHER): Payer: Self-pay

## 2014-07-04 ENCOUNTER — Encounter: Payer: Self-pay | Admitting: Gastroenterology

## 2014-07-04 VITALS — BP 123/70 | HR 68 | Temp 97.4°F | Ht 75.0 in | Wt 196.0 lb

## 2014-07-04 DIAGNOSIS — R1310 Dysphagia, unspecified: Secondary | ICD-10-CM

## 2014-07-04 DIAGNOSIS — K219 Gastro-esophageal reflux disease without esophagitis: Secondary | ICD-10-CM

## 2014-07-04 DIAGNOSIS — Z1211 Encounter for screening for malignant neoplasm of colon: Secondary | ICD-10-CM

## 2014-07-04 DIAGNOSIS — K648 Other hemorrhoids: Secondary | ICD-10-CM

## 2014-07-04 NOTE — Assessment & Plan Note (Signed)
SX CONTROLLED.  CONTINUE TO MONITOR SYMPTOMS.

## 2014-07-04 NOTE — Progress Notes (Signed)
   Subjective:    Patient ID: Steven Glover, male    DOB: January 18, 1951, 64 y.o.   MRN: [id number]  [name], MD  HPI BEEN DOING GOOD. [place] FEELS BETTER. SWALLOWING OK. LOST 3 LBS SINCE JAN 2015. BMs: DAILY.   PT DENIES HEMATOCHEZIA, HEMATEMESIS, nausea, vomiting, melena, diarrhea, CHEST PAIN, SHORTNESS OF BREATH,  CHANGE IN BOWEL IN HABITS, constipation, abdominal pain, problems swallowing, OR heartburn or indigestion.  Past Medical History  Diagnosis Date  . Rheumatoid arthritis(714.0)   . Acid reflux disease   . GERD (gastroesophageal reflux disease)   . BMI between 19-24,adult JUL 2012 191.8 LBS  . Collagen vascular disease   . Asthma     as child  . Internal hemorrhoids with other complication     TCS DEC 2013  . Allergy   . Pulmonary nodule 2013    [facility] - Repeat 08/2012   Past Surgical History  Procedure Laterality Date  . Wrist fusion    . Colonoscopy w/ polypectomy  2010 NUR  . Shoulder open rotator cuff repair  2005  . Esophagogastroduodenoscopy  01/01/2011 [id number] CHRONIC GASTRITIS    SLF: Hiatal Hernia/mild gastritis/stricture in the distal esophagus  . Savory dilation  01/01/2011    Procedure: SAVORY DILATION;  Surgeon: Dorothyann Peng, MD;  Location: [facility];  Service: Endoscopy;  Laterality: N/A;  . Colonoscopy  2009 NUR [place]    MULTIPLE SIMPLE ADENOMAS  . Colonoscopy with propofol  05/15/2012    2 SIMPLE ADENOMAS  .          No Known Allergies  Current Outpatient Prescriptions  Medication Sig Dispense Refill  . cetirizine (ZYRTEC) 10 MG tablet Take by mouth.    . esomeprazole (NEXIUM) 40 MG capsule Take 40 mg by mouth daily at 12 noon. OTC   . etanercept (ENBREL) 50 MG/ML injection Inject 50 mg into the skin once a week. On Tuesday every week    . folic acid (FOLVITE) 1 MG tablet Take 1 mg by mouth daily.      . methotrexate 2.5 MG tablet Take 10 mg by mouth once a week. Takes 4 tablets once weekly on Thursday     Review of  Systems     Objective:   Physical Exam  Constitutional: He is oriented to person, place, and time. He appears well-developed and well-nourished. No distress.  HENT:  Head: Normocephalic and atraumatic.  Mouth/Throat: Oropharynx is clear and moist. No oropharyngeal exudate.  Eyes: Pupils are equal, round, and reactive to light. No scleral icterus.  Neck: Normal range of motion. Neck supple.  Cardiovascular: Normal rate, regular rhythm and normal heart sounds.   Pulmonary/Chest: Effort normal and breath sounds normal. No respiratory distress.  Abdominal: Soft. Bowel sounds are normal. He exhibits no distension. There is no tenderness.  Musculoskeletal: He exhibits no edema.  Lymphadenopathy:    He has no cervical adenopathy.  Neurological: He is alert and oriented to person, place, and time.  NO FOCAL DEFICITS   Psychiatric: He has a normal mood and affect.  Vitals reviewed.         Assessment & Plan:

## 2014-07-04 NOTE — Assessment & Plan Note (Signed)
SX CONTROLLED.  CONTINUE TO MONITOR SYMPTOMS. CALL WITH QUESTIONS OR CONCERNS.

## 2014-07-04 NOTE — Patient Instructions (Addendum)
AVOID REFLUX TRIGGERS. SEE INFO BELOW.  CONTINUE NEXIUM. TAKE 30 MINUTES BEFORE MEALS ONCE DAILY.  PLEASE CALL WITH QUESTIONS OR CONCERNS REGARDING YOUR SWALLOWING, HEARTBURN, OR HEMORRHOIDS.  FOLLOW UP IN 1 YEAR. MERRY CHRISTMAS AND HAPPY NEW YEAR!   Lifestyle and home remedies TO HELP CONTROL HEARTBURN.  You may eliminate or reduce the frequency of heartburn by making the following lifestyle changes:  . Control your weight. Being overweight is a major risk factor for heartburn and GERD. Excess pounds put pressure on your abdomen, pushing up your stomach and causing acid to back up into your esophagus.   . Eat smaller meals. 4 TO 6 MEALS A DAY. This reduces pressure on the lower esophageal sphincter, helping to prevent the valve from opening and acid from washing back into your esophagus.   Dolphus Jenny your belt. Clothes that fit tightly around your waist put pressure on your abdomen and the lower esophageal sphincter.  .  . Eliminate heartburn triggers. Everyone has specific triggers.  .   Common triggers such as fatty or fried foods, spicy food, tomato sauce, carbonated beverages, alcohol, chocolate, mint, garlic, onion, caffeine and nicotine may make heartburn worse.   Marland Kitchen Avoid stooping or bending. Tying your shoes is OK. Bending over for longer periods to weed your garden isn't, especially soon after eating.   . Don't lie down after a meal. Wait at least three to four hours after eating before going to bed, and don't lie down right after eating.   Marland Kitchen PLACE THE HEAD OF YOUR BED ON 6 INCH BLOCKS.  Alternative medicine . Several home remedies exist for treating GERD, but they provide only temporary relief. They include drinking baking soda (sodium bicarbonate) added to water or drinking other fluids such as baking soda mixed with cream of tartar and water. . Although these liquids create temporary relief by neutralizing, washing away or buffering acids, eventually they aggravate the  situation by adding gas and fluid to your stomach, increasing pressure and causing more acid reflux. Further, adding more sodium to your diet may increase your blood pressure and add stress to your heart, and excessive bicarbonate ingestion can alter the acid-base balance in your body.

## 2014-07-04 NOTE — Assessment & Plan Note (Signed)
SX CONTROLLED. LOST 3 LBS.  LOW FAT DIET AVOID TRIGGERS FOLLOW UP IN 1 YEAR.

## 2014-07-04 NOTE — Assessment & Plan Note (Signed)
REVIEWED TCS/PATH REPORT FROM 2013.  NEXT TCS IN 2018.

## 2014-07-05 NOTE — Progress Notes (Signed)
cc'ed to pcp °

## 2014-07-09 NOTE — Progress Notes (Signed)
ON RECALL LIST  °

## 2014-09-09 ENCOUNTER — Ambulatory Visit (INDEPENDENT_AMBULATORY_CARE_PROVIDER_SITE_OTHER): Payer: Managed Care, Other (non HMO) | Admitting: Family Medicine

## 2014-09-09 ENCOUNTER — Encounter: Payer: Self-pay | Admitting: Family Medicine

## 2014-09-09 VITALS — BP 132/88 | Temp 98.5°F | Ht 75.0 in | Wt 195.0 lb

## 2014-09-09 DIAGNOSIS — J301 Allergic rhinitis due to pollen: Secondary | ICD-10-CM

## 2014-09-09 DIAGNOSIS — J0100 Acute maxillary sinusitis, unspecified: Secondary | ICD-10-CM | POA: Diagnosis not present

## 2014-09-09 MED ORDER — LEVOFLOXACIN 500 MG PO TABS: 500.0000 mg | ORAL_TABLET | Freq: Every day | ORAL | Status: DC

## 2014-09-09 NOTE — Progress Notes (Signed)
   Subjective:    Patient ID: Steven Glover, male    DOB: 13-May-1951, 64 y.o.   MRN: [id number]  Sinusitis This is a new problem. Episode onset: 3 days ago. Associated symptoms include congestion, coughing, headaches and sinus pressure. Pertinent negatives include no ear pain. (Runny nose) Past treatments include nothing.    pmh benign Rheumatoid  Review of Systems  Constitutional: Negative for fever and activity change.  HENT: Positive for congestion, rhinorrhea and sinus pressure. Negative for ear pain.   Eyes: Negative for discharge.  Respiratory: Positive for cough. Negative for wheezing.   Cardiovascular: Negative for chest pain.  Neurological: Positive for headaches.       Objective:   Physical Exam  Constitutional: He appears well-developed.  HENT:  Head: Normocephalic.  Mouth/Throat: Oropharynx is clear and moist. No oropharyngeal exudate.  Neck: Normal range of motion.  Cardiovascular: Normal rate, regular rhythm and normal heart sounds.   No murmur heard. Pulmonary/Chest: Effort normal and breath sounds normal. He has no wheezes.  Lymphadenopathy:    He has no cervical adenopathy.  Neurological: He exhibits normal muscle tone.  Skin: Skin is warm and dry.  Nursing note and vitals reviewed.         Assessment & Plan:  1. Acute maxillary sinusitis, recurrence not specified antibiotic  2. Allergic rhinitis due to pollen Allergy medicine  Warnings discussed  Wellness in summer

## 2014-09-17 ENCOUNTER — Telehealth: Payer: Self-pay | Admitting: Family Medicine

## 2014-09-17 MED ORDER — CEFPROZIL 500 MG PO TABS: 500.0000 mg | ORAL_TABLET | Freq: Two times a day (BID) | ORAL | Status: DC

## 2014-09-17 NOTE — Telephone Encounter (Signed)
I would recommend Cefzil 500 mg twice a day 10 days, if ongoing troubles let us know

## 2014-09-17 NOTE — Addendum Note (Signed)
Addended by: Dairl Ponder on: 09/17/2014 12:33 PM   Modules accepted: Orders

## 2014-09-17 NOTE — Telephone Encounter (Signed)
Has one more day left on levaquin. No fever, no wheezing.

## 2014-09-17 NOTE — Telephone Encounter (Signed)
Rx sent electronically to pharmacy. Patient notified. 

## 2014-09-17 NOTE — Telephone Encounter (Signed)
Pt states he is not quite better yet, he still has stuffiness, pressure around  His eyes, yellowish drainage  States you said to call back if not better to call back    [facility]

## 2014-11-29 ENCOUNTER — Encounter: Payer: Self-pay | Admitting: Family Medicine

## 2014-11-29 ENCOUNTER — Ambulatory Visit (INDEPENDENT_AMBULATORY_CARE_PROVIDER_SITE_OTHER): Payer: Managed Care, Other (non HMO) | Admitting: Family Medicine

## 2014-11-29 VITALS — BP 110/82 | Ht 75.0 in | Wt 187.1 lb

## 2014-11-29 DIAGNOSIS — E785 Hyperlipidemia, unspecified: Secondary | ICD-10-CM

## 2014-11-29 DIAGNOSIS — Z125 Encounter for screening for malignant neoplasm of prostate: Secondary | ICD-10-CM

## 2014-11-29 DIAGNOSIS — Z23 Encounter for immunization: Secondary | ICD-10-CM | POA: Diagnosis not present

## 2014-11-29 DIAGNOSIS — Z0000 Encounter for general adult medical examination without abnormal findings: Secondary | ICD-10-CM | POA: Diagnosis not present

## 2014-11-29 NOTE — Progress Notes (Signed)
   Subjective:    Patient ID: Steven Glover, male    DOB: Jul 22, 1950, 64 y.o.   MRN: [id number]  HPI  The patient comes in today for a wellness visit.    A review of their health history was completed.  A review of medications was also completed.  Any needed refills; no  Eating habits: good  Falls/  MVA accidents in past few months: None  Regular exercise: sometimes, walk a few times a week.  Specialist pt sees on regular basis: Dr.Beetman (Rhematoid Arthritis), Trinda Pascal Physiological scientist)   Preventative health issues were discussed.   Additional concerns: Patient states no concerns this visit.    Review of Systems  Constitutional: Negative for activity change, appetite change and fatigue.  HENT: Negative for congestion.   Respiratory: Negative for cough.   Cardiovascular: Negative for chest pain.  Gastrointestinal: Negative for abdominal pain.  Endocrine: Negative for polydipsia and polyphagia.  Neurological: Negative for weakness.  Psychiatric/Behavioral: Negative for confusion.       Objective:   Physical Exam  Constitutional: He appears well-nourished. No distress.  Cardiovascular: Normal rate, regular rhythm and normal heart sounds.   No murmur heard. Pulmonary/Chest: Effort normal and breath sounds normal. No respiratory distress.  Musculoskeletal: He exhibits no edema.  Lymphadenopathy:    He has no cervical adenopathy.  Neurological: He is alert.  Psychiatric: His behavior is normal.  Vitals reviewed.  Prostate slightly enlarged otherwise normal  Healthy diet, safety, exercise discuss     Assessment & Plan:  Patients CBC and liver profile is followed by rheumatologist. Standard labs ordered Up-to-date on colonoscopy Prevnar 13 recommended Patient states rheumatologist told him not to get shingles vaccine Follow-up in one year for wellness

## 2014-11-30 LAB — PSA: PROSTATE SPECIFIC AG, SERUM: 1.9 ng/mL (ref 0.0–4.0)

## 2014-11-30 LAB — LIPID PANEL
CHOLESTEROL TOTAL: 178 mg/dL (ref 100–199)
Chol/HDL Ratio: 5.6 ratio units — ABNORMAL HIGH (ref 0.0–5.0)
HDL: 32 mg/dL — ABNORMAL LOW (ref >39)
LDL Calculated: 104 mg/dL — ABNORMAL HIGH (ref 0–99)
Triglycerides: 211 mg/dL — ABNORMAL HIGH (ref 0–149)
VLDL Cholesterol Cal: 42 mg/dL — ABNORMAL HIGH (ref 5–40)

## 2014-11-30 LAB — BASIC METABOLIC PANEL
BUN/Creatinine Ratio: 19 (ref 10–22)
BUN: 21 mg/dL (ref 8–27)
CALCIUM: 9.4 mg/dL (ref 8.6–10.2)
CHLORIDE: 102 mmol/L (ref 97–108)
CO2: 23 mmol/L (ref 18–29)
Creatinine, Ser: 1.11 mg/dL (ref 0.76–1.27)
GFR calc Af Amer: 80 mL/min/{1.73_m2} (ref >59)
GFR calc non Af Amer: 69 mL/min/{1.73_m2} (ref >59)
GLUCOSE: 88 mg/dL (ref 65–99)
POTASSIUM: 4.2 mmol/L (ref 3.5–5.2)
SODIUM: 140 mmol/L (ref 134–144)

## 2014-12-01 ENCOUNTER — Encounter: Payer: Self-pay | Admitting: Family Medicine

## 2014-12-22 ENCOUNTER — Encounter: Payer: Self-pay | Admitting: Gastroenterology

## 2014-12-22 NOTE — Progress Notes (Signed)
REVIEWED-NO ADDITIONAL RECOMMENDATIONS. 

## 2015-02-21 ENCOUNTER — Ambulatory Visit (INDEPENDENT_AMBULATORY_CARE_PROVIDER_SITE_OTHER): Payer: Managed Care, Other (non HMO) | Admitting: Family Medicine

## 2015-02-21 ENCOUNTER — Encounter: Payer: Self-pay | Admitting: Family Medicine

## 2015-02-21 VITALS — BP 128/80 | Temp 98.9°F | Wt 184.0 lb

## 2015-02-21 DIAGNOSIS — J0110 Acute frontal sinusitis, unspecified: Secondary | ICD-10-CM

## 2015-02-21 MED ORDER — AZITHROMYCIN 250 MG PO TABS: ORAL_TABLET | ORAL | Status: DC

## 2015-02-21 NOTE — Progress Notes (Signed)
   Subjective:    Patient ID: Steven Glover, male    DOB: 08/20/1950, 64 y.o.   MRN: [id number]  Sinusitis This is a new problem. Episode onset: 5 days ago. Associated symptoms include coughing, headaches and sinus pressure. (Dizzy ) Past treatments include nothing.    Started frst of the wk  No one else sick at home  Pressure around the eyes  tjroat scratchy   Yell prod  Felt low gr fever  h ah bad  Last nite    Review of Systems  HENT: Positive for sinus pressure.   Respiratory: Positive for cough.   Neurological: Positive for headaches.       Objective:   Physical Exam  Alert no acute distress HEENT moderate nasal congestion frontal tenderness pharynx erythematous neck supple lungs intermittent bronchial cough heart rare rhythm      Assessment & Plan:  Impression acute rhinosinusitis/bronchitis plan antibiotics prescribed. Symptom care discussed when signs discussed WSL

## 2015-03-12 ENCOUNTER — Ambulatory Visit (INDEPENDENT_AMBULATORY_CARE_PROVIDER_SITE_OTHER): Payer: Managed Care, Other (non HMO)

## 2015-03-12 DIAGNOSIS — Z23 Encounter for immunization: Secondary | ICD-10-CM | POA: Diagnosis not present

## 2015-06-09 ENCOUNTER — Encounter: Payer: Self-pay | Admitting: Gastroenterology

## 2015-07-15 ENCOUNTER — Ambulatory Visit (INDEPENDENT_AMBULATORY_CARE_PROVIDER_SITE_OTHER): Payer: Managed Care, Other (non HMO) | Admitting: Family Medicine

## 2015-07-15 ENCOUNTER — Encounter: Payer: Self-pay | Admitting: Family Medicine

## 2015-07-15 VITALS — Temp 97.8°F | Ht 75.0 in | Wt 192.8 lb

## 2015-07-15 DIAGNOSIS — J019 Acute sinusitis, unspecified: Secondary | ICD-10-CM

## 2015-07-15 DIAGNOSIS — B9689 Other specified bacterial agents as the cause of diseases classified elsewhere: Secondary | ICD-10-CM

## 2015-07-15 MED ORDER — DOXYCYCLINE HYCLATE 100 MG PO CAPS: 100.0000 mg | ORAL_CAPSULE | Freq: Two times a day (BID) | ORAL | Status: DC

## 2015-07-15 NOTE — Progress Notes (Signed)
   Subjective:    Patient ID: Steven Glover, male    DOB: [birth year], 64 y.o.   MRN: [id number]  Sinus Problem This is a new problem. The current episode started in the past 7 days. Associated symptoms include congestion, coughing and headaches. Pertinent negatives include no ear pain.   PMH benign   Review of Systems  Constitutional: Negative for fever, activity change and fatigue.  HENT: Positive for congestion and rhinorrhea. Negative for ear pain.   Eyes: Negative for discharge.  Respiratory: Positive for cough. Negative for wheezing.   Cardiovascular: Negative for chest pain.  Neurological: Positive for headaches.       Objective:   Physical Exam  Mild sinus tenderness eardrums normal throat is normal neck supple lungs clear heart regular      Assessment & Plan:  . Viral syndrome Secondary rhinosinusitis salsin Patient was seen today for upper respiratory illness. It is felt that the patient is dealing with sinusitis. Antibiotics were prescribed today. Importance of compliance with medication was discussed. Symptoms should gradually resolve over the course of the next several days. If high fevers, progressive illness, difficulty breathing, worsening condition or failure for symptoms to improve over the next several days then the patient is to follow-up. If any emergent conditions the patient is to follow-up in the emergency department otherwise to follow-up in the office.

## 2015-07-16 ENCOUNTER — Ambulatory Visit (INDEPENDENT_AMBULATORY_CARE_PROVIDER_SITE_OTHER): Payer: Managed Care, Other (non HMO) | Admitting: Gastroenterology

## 2015-07-16 ENCOUNTER — Encounter: Payer: Self-pay | Admitting: Gastroenterology

## 2015-07-16 ENCOUNTER — Encounter (INDEPENDENT_AMBULATORY_CARE_PROVIDER_SITE_OTHER): Payer: Self-pay

## 2015-07-16 VITALS — BP 132/74 | HR 65 | Temp 97.3°F | Ht 75.0 in | Wt 193.6 lb

## 2015-07-16 DIAGNOSIS — Z1211 Encounter for screening for malignant neoplasm of colon: Secondary | ICD-10-CM | POA: Diagnosis not present

## 2015-07-16 DIAGNOSIS — R1310 Dysphagia, unspecified: Secondary | ICD-10-CM

## 2015-07-16 DIAGNOSIS — K648 Other hemorrhoids: Secondary | ICD-10-CM

## 2015-07-16 DIAGNOSIS — K219 Gastro-esophageal reflux disease without esophagitis: Secondary | ICD-10-CM

## 2015-07-16 NOTE — Progress Notes (Signed)
cc'ed to pcp °

## 2015-07-16 NOTE — Assessment & Plan Note (Signed)
SYMPTOMS CONTROLLED/RESOLVED.  CONTINUE TO MONITOR SYMPTOMS. 

## 2015-07-16 NOTE — Assessment & Plan Note (Signed)
SYMPTOMS CONTROLLED/RESOLVED.  CONTINUE NEXIUM. TAKE 30 MINUTES BEFORE ONE MEAL DAILY. Samples given. FOLLOW UP IN 1 YEAR.

## 2015-07-16 NOTE — Patient Instructions (Signed)
DRINK WATER TO KEEP YOUR URINE LIGHT YELLOW.  FOLLOW A HIGH FIBER DIET.  AVOID ITEMS THAT CAUSE BLOATING & GAS.SEE INFO BELOW.  CONTINUE NEXIUM. TAKE 30 MINUTES BEFORE one MEAL a day.  Next colonoscopy in [place] 2018.  FOLLOW UP IN 1 YEAR.    High-Fiber Diet A high-fiber diet changes your normal diet to include more whole grains, legumes, fruits, and vegetables. Changes in the diet involve replacing refined carbohydrates with unrefined foods. The calorie level of the diet is essentially unchanged. The Dietary Reference Intake (recommended amount) for adult males is 38 grams per day. For adult females, it is 25 grams per day. Pregnant and lactating women should consume 28 grams of fiber per day. Fiber is the intact part of a plant that is not broken down during digestion. Functional fiber is fiber that has been isolated from the plant to provide a beneficial effect in the body. PURPOSE  Increase stool bulk.   Ease and regulate bowel movements.   Lower cholesterol.  REDUCE RISK OF COLON CANCER  INDICATIONS THAT YOU NEED MORE FIBER  Constipation and hemorrhoids.   Uncomplicated diverticulosis (intestine condition) and irritable bowel syndrome.   Weight management.   As a protective measure against hardening of the arteries (atherosclerosis), diabetes, and cancer.   GUIDELINES FOR INCREASING FIBER IN THE DIET  Start adding fiber to the diet slowly. A gradual increase of about 5 more grams (2 slices of whole-wheat bread, 2 servings of most fruits or vegetables, or 1 bowl of high-fiber cereal) per day is best. Too rapid an increase in fiber may result in constipation, flatulence, and bloating.   Drink enough water and fluids to keep your urine clear or pale yellow. Water, juice, or caffeine-free drinks are recommended. Not drinking enough fluid may cause constipation.   Eat a variety of high-fiber foods rather than one type of fiber.   Try to increase your intake of fiber through  using high-fiber foods rather than fiber pills or supplements that contain small amounts of fiber.   The goal is to change the types of food eaten. Do not supplement your present diet with high-fiber foods, but replace foods in your present diet.  INCLUDE A VARIETY OF FIBER SOURCES  Replace refined and processed grains with whole grains, canned fruits with fresh fruits, and incorporate other fiber sources. White rice, white breads, and most bakery goods contain little or no fiber.   Brown whole-grain rice, buckwheat oats, and many fruits and vegetables are all good sources of fiber. These include: broccoli, Brussels sprouts, cabbage, cauliflower, beets, sweet potatoes, white potatoes (skin on), carrots, tomatoes, eggplant, squash, berries, fresh fruits, and dried fruits.   Cereals appear to be the richest source of fiber. Cereal fiber is found in whole grains and bran. Bran is the fiber-rich outer coat of cereal grain, which is largely removed in refining. In whole-grain cereals, the bran remains. In breakfast cereals, the largest amount of fiber is found in those with "bran" in their names. The fiber content is sometimes indicated on the label.   You may need to include additional fruits and vegetables each day.   In baking, for 1 cup white flour, you may use the following substitutions:   1 cup whole-wheat flour minus 2 tablespoons.   1/2 cup white flour plus 1/2 cup whole-wheat flour.

## 2015-07-16 NOTE — Progress Notes (Signed)
ON RECALL  °

## 2015-07-16 NOTE — Progress Notes (Signed)
   Subjective:    Patient ID: Steven Glover, male    DOB: 07-12-1950, 65 y.o.   MRN: [id number] [name], MD  HPI No questions or concerns. BOWELS MOVING. HEARTBURN CONTROLLED. SWALLOWING GOOD. BEEN RESTORING A VINTAGE PACKARD CAR.  PT DENIES FEVER, CHILLS, HEMATOCHEZIA, nausea, vomiting, melena, diarrhea, CHEST PAIN, SHORTNESS OF BREATH,  CHANGE IN BOWEL IN HABITS, constipation, abdominal pain, problems swallowing, OR heartburn or indigestion.   Past Medical History  Diagnosis Date  . Rheumatoid arthritis(714.0)   . Acid reflux disease   . GERD (gastroesophageal reflux disease)   . BMI between 19-24,adult JUL 2012 191.8 LBS  . Collagen vascular disease ([place])   . Asthma     as child  . Internal hemorrhoids with other complication     TCS DEC 2013  . Allergy   . Pulmonary nodule 2013    [facility] - Repeat 08/2012    Past Surgical History  Procedure Laterality Date  . Wrist fusion    . Colonoscopy w/ polypectomy  2010 NUR  . Shoulder open rotator cuff repair  2005  . Esophagogastroduodenoscopy  01/01/2011 [id number] CHRONIC GASTRITIS    SLF: Hiatal Hernia/mild gastritis/stricture in the distal esophagus  . Savory dilation  01/01/2011    Procedure: SAVORY DILATION;  Surgeon: Dorothyann Peng, MD;  Location: [facility];  Service: Endoscopy;  Laterality: N/A;  . Colonoscopy  2009 NUR [place]    MULTIPLE SIMPLE ADENOMAS  . Colonoscopy with propofol  05/15/2012    SLF:Two sessile polyps ranging between 3-48mm in size were found in the ascending colon and rectum; multiple biopsies were performed/Mild diverticulosis was noted in the sigmoid colon/The colon mucosa was otherwise normal/ Moderate sized internal hemorrhoids. TCS 04/2017.  . Polypectomy  05/15/2012    SIMPLE ADENOMA(2)    Allergies  Allergen Reactions  . Cefprozil     Nausea, felt bad    Current Outpatient Prescriptions  Medication Sig Dispense Refill  . cetirizine (ZYRTEC) 10 MG tablet Take by mouth.    .  esomeprazole 20 MG capsule OTC Take 20 mg by mouth daily at 12 noon.    . etanercept 50 MG/ML injection Inject 50 mg into the skin once a week. On Tuesday every week    . folic acid (FOLVITE) 1 MG tablet Take 1 mg by mouth daily.      . methotrexate 2.5 MG tablet Take 10 mg 4 tablets once weekly on Thursday    .       Review of Systems PER HPI OTHERWISE ALL SYSTEMS ARE NEGATIVE.    Objective:   Physical Exam  Constitutional: He is oriented to person, place, and time. He appears well-developed and well-nourished. No distress.  HENT:  Head: Normocephalic and atraumatic.  Mouth/Throat: Oropharynx is clear and moist. No oropharyngeal exudate.  Eyes: Pupils are equal, round, and reactive to light. No scleral icterus.  Neck: Normal range of motion. Neck supple.  Cardiovascular: Normal rate, regular rhythm and normal heart sounds.   Pulmonary/Chest: Effort normal and breath sounds normal. No respiratory distress.  Abdominal: Soft. Bowel sounds are normal. He exhibits no distension. There is no tenderness.  Musculoskeletal: He exhibits no edema.  Lymphadenopathy:    He has no cervical adenopathy.  Neurological: He is alert and oriented to person, place, and time.  Psychiatric: He has a normal mood and affect.  Vitals reviewed.     Assessment & Plan:

## 2015-07-16 NOTE — Assessment & Plan Note (Signed)
SYMPTOMS CONTROLLED/RESOLVED.  CONTINUE TO MONITOR SYMPTOMS. FOLLOW UP IN 1 YEAR.  

## 2015-07-16 NOTE — Assessment & Plan Note (Signed)
PERSONAL HISTORY OF POLYPS. NO WARNING SIGNS/SYMPTOMS  DRINK WATER TO KEEP YOUR URINE LIGHT YELLOW. FOLLOW A HIGH FIBER DIET. AVOID ITEMS THAT CAUSE BLOATING & GAS.  HANDOUT GIVEN. Next TCS IN DEC 2018.

## 2015-08-12 ENCOUNTER — Encounter: Payer: Self-pay | Admitting: Family Medicine

## 2015-08-12 ENCOUNTER — Ambulatory Visit (INDEPENDENT_AMBULATORY_CARE_PROVIDER_SITE_OTHER): Payer: Managed Care, Other (non HMO) | Admitting: Family Medicine

## 2015-08-12 VITALS — BP 132/86 | Ht 75.0 in | Wt 194.8 lb

## 2015-08-12 DIAGNOSIS — R030 Elevated blood-pressure reading, without diagnosis of hypertension: Secondary | ICD-10-CM | POA: Diagnosis not present

## 2015-08-12 NOTE — Progress Notes (Signed)
   Subjective:    Patient ID: Steven Glover, male    DOB: April 08, 1951, 65 y.o.   MRN: [id number]  HPI  Patient arrives with c/o blood pressure being up last night-156/92 and 158/98. Patient states he has never had any bp problems before Occasional fatigue but denies any type of muscle aches chest pain shortness breath nausea vomiting diarrhea states the blood pressure readings that were elevated was at work with electronic cuff that was on the wall he states he checked it later with his daughters blood pressure cuff and got several good readings in the 130/80 range 120s over 70s range patient denies excessive salt use states he does try do a good job of eating healthy does try to stay somewhat active with his job and to some degree yard work he does have a family history of strokes but no personal symptoms of strokes Review of Systems    see above Objective:   Physical Exam Lungs are clear hearts regular neck no masses pulses are normal extremities no edema blood pressure was taken twice left arm and right arm in the sitting position with a normal size cuff blood pressure on the left side 126/78 right side 132/82/84       Assessment & Plan:  Currently right now I do not find evidence of hypertension we discussed hypertension discuss proper diet regular physical activity and monitoring his his daughter has electronic blood pressure cuff he will monitor intermittently in order to make sure that stays under good control he'll send [place] readings within a few weeks time he will try to fit and some walking on a regular basis and preventative lab work in office visit in mid summer follow-up sooner problems

## 2015-08-12 NOTE — Patient Instructions (Signed)
DASH Eating Plan  DASH stands for "Dietary Approaches to Stop Hypertension." The DASH eating plan is a healthy eating plan that has been shown to reduce high blood pressure (hypertension). Additional health benefits may include reducing the risk of type 2 diabetes mellitus, heart disease, and stroke. The DASH eating plan may also help with weight loss.  WHAT DO I NEED TO KNOW ABOUT THE DASH EATING PLAN?  For the DASH eating plan, you will follow these general guidelines:  · Choose foods with a percent daily value for sodium of less than 5% (as listed on the food label).  · Use salt-free seasonings or herbs instead of table salt or sea salt.  · Check with your health care provider or pharmacist before using salt substitutes.  · Eat lower-sodium products, often labeled as "lower sodium" or "no salt added."  · Eat fresh foods.  · Eat more vegetables, fruits, and low-fat dairy products.  · Choose whole grains. Look for the word "whole" as the first word in the ingredient list.  · Choose fish and skinless chicken or turkey more often than red meat. Limit fish, poultry, and meat to 6 oz (170 g) each day.  · Limit sweets, desserts, sugars, and sugary drinks.  · Choose heart-healthy fats.  · Limit cheese to 1 oz (28 g) per day.  · Eat more home-cooked food and less restaurant, buffet, and fast food.  · Limit fried foods.  · Cook foods using methods other than frying.  · Limit canned vegetables. If you do use them, rinse them well to decrease the sodium.  · When eating at a restaurant, ask that your food be prepared with less salt, or no salt if possible.  WHAT FOODS CAN I EAT?  Seek help from a dietitian for individual calorie needs.  Grains  Whole grain or whole wheat bread. Brown rice. Whole grain or whole wheat pasta. Quinoa, bulgur, and whole grain cereals. Low-sodium cereals. Corn or whole wheat flour tortillas. Whole grain cornbread. Whole grain crackers. Low-sodium crackers.  Vegetables  Fresh or frozen vegetables  (raw, steamed, roasted, or grilled). Low-sodium or reduced-sodium tomato and vegetable juices. Low-sodium or reduced-sodium tomato sauce and paste. Low-sodium or reduced-sodium canned vegetables.   Fruits  All fresh, canned (in natural juice), or frozen fruits.  Meat and Other Protein Products  Ground beef (85% or leaner), grass-fed beef, or beef trimmed of fat. Skinless chicken or turkey. Ground chicken or turkey. Pork trimmed of fat. All fish and seafood. Eggs. Dried beans, peas, or lentils. Unsalted nuts and seeds. Unsalted canned beans.  Dairy  Low-fat dairy products, such as skim or 1% milk, 2% or reduced-fat cheeses, low-fat ricotta or cottage cheese, or plain low-fat yogurt. Low-sodium or reduced-sodium cheeses.  Fats and Oils  Tub margarines without trans fats. Light or reduced-fat mayonnaise and salad dressings (reduced sodium). Avocado. Safflower, olive, or canola oils. Natural peanut or almond butter.  Other  Unsalted popcorn and pretzels.  The items listed above may not be a complete list of recommended foods or beverages. Contact your dietitian for more options.  WHAT FOODS ARE NOT RECOMMENDED?  Grains  White bread. White pasta. White rice. Refined cornbread. Bagels and croissants. Crackers that contain trans fat.  Vegetables  Creamed or fried vegetables. Vegetables in a cheese sauce. Regular canned vegetables. Regular canned tomato sauce and paste. Regular tomato and vegetable juices.  Fruits  Dried fruits. Canned fruit in light or heavy syrup. Fruit juice.  Meat and Other Protein   Products  Fatty cuts of meat. Ribs, chicken wings, bacon, sausage, bologna, salami, chitterlings, fatback, hot dogs, bratwurst, and packaged luncheon meats. Salted nuts and seeds. Canned beans with salt.  Dairy  Whole or 2% milk, cream, half-and-half, and cream cheese. Whole-fat or sweetened yogurt. Full-fat cheeses or blue cheese. Nondairy creamers and whipped toppings. Processed cheese, cheese spreads, or cheese  curds.  Condiments  Onion and garlic salt, seasoned salt, table salt, and sea salt. Canned and packaged gravies. Worcestershire sauce. Tartar sauce. Barbecue sauce. Teriyaki sauce. Soy sauce, including reduced sodium. Steak sauce. Fish sauce. Oyster sauce. Cocktail sauce. Horseradish. Ketchup and mustard. Meat flavorings and tenderizers. Bouillon cubes. Hot sauce. Tabasco sauce. Marinades. Taco seasonings. Relishes.  Fats and Oils  Butter, stick margarine, lard, shortening, ghee, and bacon fat. Coconut, palm kernel, or palm oils. Regular salad dressings.  Other  Pickles and olives. Salted popcorn and pretzels.  The items listed above may not be a complete list of foods and beverages to avoid. Contact your dietitian for more information.  WHERE CAN I FIND MORE INFORMATION?  National Heart, Lung, and [employer] Institute: www.nhlbi.nih.gov/health/health-topics/topics/dash/     This information is not intended to replace advice given to you by your health care provider. Make sure you discuss any questions you have with your health care provider.     Document Released: 04/29/2011 Document Revised: 05/31/2014 Document Reviewed: 03/14/2013  Elsevier Interactive Patient Education ©2016 Elsevier Inc.

## 2015-11-06 ENCOUNTER — Ambulatory Visit (INDEPENDENT_AMBULATORY_CARE_PROVIDER_SITE_OTHER): Payer: Managed Care, Other (non HMO) | Admitting: Podiatry

## 2015-11-06 ENCOUNTER — Ambulatory Visit (INDEPENDENT_AMBULATORY_CARE_PROVIDER_SITE_OTHER): Payer: Managed Care, Other (non HMO)

## 2015-11-06 ENCOUNTER — Encounter: Payer: Self-pay | Admitting: Podiatry

## 2015-11-06 VITALS — BP 147/85 | HR 69 | Resp 16 | Ht 75.0 in | Wt 195.0 lb

## 2015-11-06 DIAGNOSIS — M2012 Hallux valgus (acquired), left foot: Secondary | ICD-10-CM | POA: Diagnosis not present

## 2015-11-06 DIAGNOSIS — M778 Other enthesopathies, not elsewhere classified: Secondary | ICD-10-CM

## 2015-11-06 DIAGNOSIS — M2042 Other hammer toe(s) (acquired), left foot: Secondary | ICD-10-CM | POA: Diagnosis not present

## 2015-11-06 DIAGNOSIS — M7752 Other enthesopathy of left foot: Secondary | ICD-10-CM

## 2015-11-06 DIAGNOSIS — M779 Enthesopathy, unspecified: Secondary | ICD-10-CM

## 2015-11-06 NOTE — Progress Notes (Signed)
   Subjective:    Patient ID: Steven Glover, male    DOB: 1951-04-06, 65 y.o.   MRN: [id number]  HPI: He presents today with his wife concerned about his left foot. He has a 10 year history of pain and deformity to his left foot. He states that he continues to work on a regular basis. He states that his foot is becoming so painful he can hardly perform his daily activities. He states that he would like to consider some type of correction. He is on state that he is in good health other than rheumatoid arthritis for which she takes Enbrel as an injection once a week. Dr. Amil Amen is his rheumatologist. He denies problems with bleeding cardiac issues pulmonary issues and problems with hypertension.    Review of Systems  HENT: Positive for hearing loss and tinnitus.   All other systems reviewed and are negative.      Objective:   Physical Exam: Vital signs are stable he is alert and oriented 3 pulses are strongly palpable. Neurologic sensorium is intact per Semmes-Weinstein monofilament. Deep tendon reflexes are intact. Muscle strength is 5 over 5 dorsiflexion plantar flexors and inverters and everters all intrinsic musculature is intact. Orthopedic evaluation demonstrates all joints distal to the ankle range of motion without crepitation. With exception of the first the second and third metatarsophalangeal joints of the left foot. This has severe hallux abductovalgus deformity with rotation and deviation of the toe. His second digit is dorsally displaced dislocated upon the head of the second metatarsal of the left foot. His toe left third is similarly positioned. Hammertoe deformities do demonstrate osteoarthritic changes at the level of the PIPJ. Cutaneous evaluation demonstrates multiple reactive hyperkeratosis plantar and plantar medial aspect of the foot.        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  Plantar flexed elongated second metatarsals with dislocation at the level of the metatarsophalangeal joints toes #2 #3 with hammertoe deformities #2 #3 of the left foot.  Plan: We discussed the etiology pathology conservative versus surgical therapies. At this point I expressed to him in no uncertain terms that rheumatoid surgeries do not always turn out as well as we hope they would. We discussed the possible hikes of surgery including fusion of the first metatarsophalangeal joint. Possible amputation of toes #2 and 3 but he is opting for first metatarsal second and third osteotomies and hammertoe repairs. I expressed to him that it's possible that the surgery may fail due to the rheumatic disease that he would like to try anyway. So we consented him for a long R Austin bunion repair first metatarsal phalangeal joint with screws left. A second and third metatarsal osteotomy shortening in nature dramatic with hammertoe repairs K wires and pins or screws. I answered all the questions regarding these procedures to the best of my ability in layman's terms. He understands this is amenable to it and signed operative patient of the consent form. We will inform Dr. Amil Amen of the upcoming surgery. We dispensed a cam walker today for postop recovery.

## 2015-11-06 NOTE — Patient Instructions (Signed)

## 2015-11-10 ENCOUNTER — Encounter: Payer: Self-pay | Admitting: *Deleted

## 2015-11-12 ENCOUNTER — Other Ambulatory Visit: Payer: Self-pay | Admitting: Podiatry

## 2015-11-12 MED ORDER — PROMETHAZINE HCL 25 MG PO TABS: 25.0000 mg | ORAL_TABLET | Freq: Three times a day (TID) | ORAL | Status: DC | PRN

## 2015-11-12 MED ORDER — CLINDAMYCIN HCL 150 MG PO CAPS: 150.0000 mg | ORAL_CAPSULE | Freq: Three times a day (TID) | ORAL | Status: DC

## 2015-11-12 MED ORDER — OXYCODONE-ACETAMINOPHEN 10-325 MG PO TABS: 1.0000 | ORAL_TABLET | Freq: Four times a day (QID) | ORAL | Status: DC | PRN

## 2015-11-13 ENCOUNTER — Telehealth: Payer: Self-pay | Admitting: *Deleted

## 2015-11-13 NOTE — Telephone Encounter (Signed)
Dr. Amil Amen responded to medical clearance request.  He stated, "I'm writing in reference to Steven Glover who has foot surgery planned for Friday, June 23rd.  He is to hold MTX and Enbrel one week before and after surgery to help prevent postoperative infection.  Since we treat him for his arthritis, and not his full medical care, full medical clearance should come from his PCP."    I called patient and informed him that we received medical clearance from Dr. Amil Amen.  He has suggested you be off Enbrel and MTX for one week prior to surgery and one week after surgery to help prevent post operative infection.  "I haven't taken any Enbrel this week.  I normally take it on Tuesday and I held off."  What about the MTX, did you take it?  "I haven't taken it either.  I should be good."  Okay, I'll let Dr. Milinda Pointer know.  "Thanks for letting me know."

## 2015-11-13 NOTE — Telephone Encounter (Signed)
ok 

## 2015-11-14 ENCOUNTER — Encounter: Payer: Self-pay | Admitting: Podiatry

## 2015-11-14 DIAGNOSIS — M2042 Other hammer toe(s) (acquired), left foot: Secondary | ICD-10-CM | POA: Diagnosis not present

## 2015-11-14 DIAGNOSIS — M21542 Acquired clubfoot, left foot: Secondary | ICD-10-CM | POA: Diagnosis not present

## 2015-11-14 DIAGNOSIS — M2012 Hallux valgus (acquired), left foot: Secondary | ICD-10-CM | POA: Diagnosis not present

## 2015-11-17 ENCOUNTER — Telehealth: Payer: Self-pay | Admitting: *Deleted

## 2015-11-17 DIAGNOSIS — M7752 Other enthesopathy of left foot: Secondary | ICD-10-CM

## 2015-11-17 NOTE — Telephone Encounter (Signed)
Post op courtesy call-Pt states he's doing okay.  I reiterated not having the surgical foot below the heart or weight bearing more then 15 mins/hour, keep the dressing clean and dry and take the pain medication as directed, and to call with concerns.  Pt states understanding.

## 2015-11-20 ENCOUNTER — Encounter: Payer: Self-pay | Admitting: Podiatry

## 2015-11-20 ENCOUNTER — Ambulatory Visit (INDEPENDENT_AMBULATORY_CARE_PROVIDER_SITE_OTHER): Payer: Managed Care, Other (non HMO)

## 2015-11-20 ENCOUNTER — Ambulatory Visit (INDEPENDENT_AMBULATORY_CARE_PROVIDER_SITE_OTHER): Payer: Managed Care, Other (non HMO) | Admitting: Podiatry

## 2015-11-20 VITALS — BP 140/82 | HR 80 | Resp 16

## 2015-11-20 DIAGNOSIS — M2012 Hallux valgus (acquired), left foot: Secondary | ICD-10-CM

## 2015-11-20 DIAGNOSIS — Z9889 Other specified postprocedural states: Secondary | ICD-10-CM | POA: Diagnosis not present

## 2015-11-20 DIAGNOSIS — M2042 Other hammer toe(s) (acquired), left foot: Secondary | ICD-10-CM

## 2015-11-20 NOTE — Progress Notes (Signed)
He presents today for his first postop visit date of surgery 11/14/2015 left foot. Status post Austin bunion repair second metatarsal osteotomy hammertoe repair #2 and #3 with screws. He states it has been painful but the worst part is sitting around and doing nothing.  Objective: Vital signs are stable alert and oriented 3. Dry sterile dressing intact was removed demonstrate considerable edema pulses are intact and palpable. Pitting edema to the lateral aspect of the foot associated with the dressing most likely. His hallux is rectus in his hammertoe #2 and number 3R rectus with K wires. Radiographs demonstrate well-positioned osteotomies and K wires. Internal fixation is intact.  Assessment: Well-healing surgical foot.  Plan: Redressed the dressed with compressive dressing he will follow-up with the nurse in a couple of weeks for suture removal once cleared by Dr. Delane Ginger redressed placed back in his cam walker and I will follow-up with him the following week. Should he have questions or concerns he will notify us immediately

## 2015-11-21 ENCOUNTER — Telehealth: Payer: Self-pay | Admitting: Podiatry

## 2015-11-21 NOTE — Telephone Encounter (Signed)
I called the patient at home and left a message on his home answering machine to ask him about a question I have on his signed medical records release form. Patient did not specify what records he would like release from him from the dates of 11 November 2015 - 03 February 2016. I asked the patient to call me back today (Friday 30 June) at the [facility] office or on Monday 03 July at our main [place] office.

## 2015-11-27 ENCOUNTER — Ambulatory Visit (INDEPENDENT_AMBULATORY_CARE_PROVIDER_SITE_OTHER): Payer: Managed Care, Other (non HMO) | Admitting: Podiatry

## 2015-11-27 ENCOUNTER — Encounter: Payer: Self-pay | Admitting: Podiatry

## 2015-11-27 DIAGNOSIS — Z9889 Other specified postprocedural states: Secondary | ICD-10-CM

## 2015-11-27 DIAGNOSIS — M2042 Other hammer toe(s) (acquired), left foot: Secondary | ICD-10-CM

## 2015-11-27 DIAGNOSIS — M2012 Hallux valgus (acquired), left foot: Secondary | ICD-10-CM

## 2015-11-27 NOTE — Progress Notes (Signed)
Presents today for follow-up of his Austin bunion repair hammertoe repair #2 #3 was pins in the second metatarsal osteotomy. He states that he is doing very well with very little problem.  Objective: Vital signs are stable he is alert and oriented 3. Pulses are palpable. Sutures are intact was removed today remained well coapted is guarded range of motion of the toes and the K wires in good position.  Assessment: Surgical foot left.  Plan: Redressed today drastic compressive dressing follow-up with him in 2 weeks for another set of x-rays. At that time we will place him in a compression anklet and a Darco shoe.

## 2015-12-11 ENCOUNTER — Ambulatory Visit (INDEPENDENT_AMBULATORY_CARE_PROVIDER_SITE_OTHER): Payer: Managed Care, Other (non HMO)

## 2015-12-11 ENCOUNTER — Encounter: Payer: Self-pay | Admitting: Podiatry

## 2015-12-11 ENCOUNTER — Ambulatory Visit (INDEPENDENT_AMBULATORY_CARE_PROVIDER_SITE_OTHER): Payer: Managed Care, Other (non HMO) | Admitting: Podiatry

## 2015-12-11 DIAGNOSIS — Z9889 Other specified postprocedural states: Secondary | ICD-10-CM

## 2015-12-11 DIAGNOSIS — M2012 Hallux valgus (acquired), left foot: Secondary | ICD-10-CM

## 2015-12-11 DIAGNOSIS — M2042 Other hammer toe(s) (acquired), left foot: Secondary | ICD-10-CM

## 2015-12-13 NOTE — Progress Notes (Signed)
He presents today one month status post Austin bunion repair left foot second metatarsal osteotomy third metatarsal osteotomy with hammertoe repair #2 and #3 with pins. He states that he is doing pretty well he's just getting tired of sitting around. He denies chest pain shortness of breath.  Objective: Vital signs are stable alert and oriented 3 surgical foot appears to be in good position. K wires are in good position. Radiographs today confirmed well healing osteotomies. No signs of infection.  Assessment: Well-healing surgical foot.  Plan: Redress today dressing compressive dressing follow-up with in 2 weeks for pin removal and application of a digital splint.

## 2015-12-18 DIAGNOSIS — M2012 Hallux valgus (acquired), left foot: Secondary | ICD-10-CM

## 2015-12-25 ENCOUNTER — Ambulatory Visit (INDEPENDENT_AMBULATORY_CARE_PROVIDER_SITE_OTHER): Payer: Managed Care, Other (non HMO) | Admitting: Podiatry

## 2015-12-25 ENCOUNTER — Ambulatory Visit (INDEPENDENT_AMBULATORY_CARE_PROVIDER_SITE_OTHER): Payer: Managed Care, Other (non HMO)

## 2015-12-25 ENCOUNTER — Encounter: Payer: Self-pay | Admitting: Podiatry

## 2015-12-25 DIAGNOSIS — M2012 Hallux valgus (acquired), left foot: Secondary | ICD-10-CM | POA: Diagnosis not present

## 2015-12-25 DIAGNOSIS — Z9889 Other specified postprocedural states: Secondary | ICD-10-CM

## 2015-12-25 DIAGNOSIS — M2042 Other hammer toe(s) (acquired), left foot: Secondary | ICD-10-CM

## 2015-12-26 NOTE — Progress Notes (Signed)
He presents today 6 weeks status post Austin bunion repair left foot second metatarsal osteotomy hammertoe repair #2 #3 of the left foot with pins. He states that he's been doing very well keeping this elevated and staying off of it as much as possible. He is ready to have his pins removed.  Objective: Vital signs are stable he is alert and oriented 3. No erythema cellulitis drainage or odor the pins are actually loose radiographs taken today demonstrate well-healed arthrodeses. No signs of infection radiographically.  Assessment: 1 healing surgical foot status post 6 weeks.  Plan: Placed him in a digital splint today. Placed in a compression anklet and recommended that he utilize his Darco shoe. I will follow-up with him in 2 weeks at which time we will progress to regular shoe gear.

## 2016-01-01 ENCOUNTER — Telehealth: Payer: Self-pay | Admitting: Family Medicine

## 2016-01-01 DIAGNOSIS — Z131 Encounter for screening for diabetes mellitus: Secondary | ICD-10-CM

## 2016-01-01 DIAGNOSIS — Z13220 Encounter for screening for lipoid disorders: Secondary | ICD-10-CM

## 2016-01-01 DIAGNOSIS — Z125 Encounter for screening for malignant neoplasm of prostate: Secondary | ICD-10-CM

## 2016-01-01 NOTE — Telephone Encounter (Signed)
Notified patient bloodwork has been ordered.  

## 2016-01-01 NOTE — Telephone Encounter (Signed)
bw orders please for PE in sept   Last labs 11/29/14 PSA, BMP, Lip,

## 2016-01-01 NOTE — Telephone Encounter (Signed)
Lipid,glucose, PSA - pt had othe labs via his rheumatologist

## 2016-01-08 ENCOUNTER — Ambulatory Visit (INDEPENDENT_AMBULATORY_CARE_PROVIDER_SITE_OTHER): Payer: Managed Care, Other (non HMO)

## 2016-01-08 ENCOUNTER — Encounter: Payer: Self-pay | Admitting: Podiatry

## 2016-01-08 ENCOUNTER — Ambulatory Visit (INDEPENDENT_AMBULATORY_CARE_PROVIDER_SITE_OTHER): Payer: Managed Care, Other (non HMO) | Admitting: Podiatry

## 2016-01-08 DIAGNOSIS — Z9889 Other specified postprocedural states: Secondary | ICD-10-CM

## 2016-01-08 DIAGNOSIS — M2012 Hallux valgus (acquired), left foot: Secondary | ICD-10-CM | POA: Diagnosis not present

## 2016-01-08 DIAGNOSIS — M2042 Other hammer toe(s) (acquired), left foot: Secondary | ICD-10-CM

## 2016-01-08 NOTE — Progress Notes (Signed)
He presents today for follow-up of his surgical foot left. He has still been utilizing his Darco shoe. Date of surgery 11/14/2015. He is status post Austin bunion repair second metatarsal osteotomy second met with hammertoe repair #2 and #3 of the left foot. He states that he is doing very well like to get back to his regular shoe gear.  Objective: Vital signs are stable alert and oriented 3. He has great range of motion much decrease in edema radiographs taken today 3 views of the left foot demonstrate all internal fixation is intact alignment appears to be perfect.  Assessment: Well-healing surgical foot left.  Plan: We'll allow him back to regular shoe gear he will follow up with me in 1 month. 

## 2016-01-10 LAB — LIPID PANEL
CHOL/HDL RATIO: 5.7 ratio — AB (ref 0.0–5.0)
Cholesterol, Total: 183 mg/dL (ref 100–199)
HDL: 32 mg/dL — AB (ref >39)
LDL Calculated: 95 mg/dL (ref 0–99)
TRIGLYCERIDES: 282 mg/dL — AB (ref 0–149)
VLDL CHOLESTEROL CAL: 56 mg/dL — AB (ref 5–40)

## 2016-01-10 LAB — PSA: Prostate Specific Ag, Serum: 2.1 ng/mL (ref 0.0–4.0)

## 2016-01-10 LAB — GLUCOSE, RANDOM: Glucose: 88 mg/dL (ref 65–99)

## 2016-01-11 ENCOUNTER — Encounter: Payer: Self-pay | Admitting: Family Medicine

## 2016-01-28 ENCOUNTER — Encounter: Payer: Self-pay | Admitting: Family Medicine

## 2016-01-28 ENCOUNTER — Ambulatory Visit (INDEPENDENT_AMBULATORY_CARE_PROVIDER_SITE_OTHER): Payer: Managed Care, Other (non HMO) | Admitting: Family Medicine

## 2016-01-28 VITALS — BP 122/80 | Ht 75.0 in | Wt 205.2 lb

## 2016-01-28 DIAGNOSIS — E784 Other hyperlipidemia: Secondary | ICD-10-CM | POA: Diagnosis not present

## 2016-01-28 DIAGNOSIS — IMO0001 Reserved for inherently not codable concepts without codable children: Secondary | ICD-10-CM

## 2016-01-28 DIAGNOSIS — Z0000 Encounter for general adult medical examination without abnormal findings: Secondary | ICD-10-CM

## 2016-01-28 DIAGNOSIS — N400 Enlarged prostate without lower urinary tract symptoms: Secondary | ICD-10-CM | POA: Diagnosis not present

## 2016-01-28 DIAGNOSIS — Z23 Encounter for immunization: Secondary | ICD-10-CM | POA: Diagnosis not present

## 2016-01-28 DIAGNOSIS — E785 Hyperlipidemia, unspecified: Secondary | ICD-10-CM | POA: Insufficient documentation

## 2016-01-28 NOTE — Progress Notes (Signed)
Subjective:    Patient ID: Steven Glover, male    DOB: 02-Nov-1950, 65 y.o.   MRN: [id number]  HPI  AWV- Annual Wellness Visit  The patient was seen for their annual wellness visit. The patient's past medical history, surgical history, and family history were reviewed. Pertinent vaccines were reviewed ( tetanus, pneumonia, shingles, flu) The patient's medication list was reviewed and updated.  The height and weight were entered. The patient's current BMI is:25.7  Cognitive screening was completed. Outcome of Mini - Cog: pass  Falls within the past 6 months:none  Current tobacco usage: none (All patients who use tobacco were given written and verbal information on quitting)  Recent listing of emergency department/hospitalizations over the past year were reviewed.  current specialist the patient sees on a regular basis: Dr Amil Amen, Dr Franz Dell annual wellness visit patient questionnaire was reviewed.  A written screening schedule for the patient for the next 5-10 years was given. Appropriate discussion of followup regarding next visit was discussed.  Patient not been able to excise much recently because of bunion repair. Patient also states energy level overall doing fairly good. Patient denies any chest pain rectal bleeding. Does have some element of BPH at nighttime with you have any urinate twice at night.    Review of Systems  Constitutional: Negative for activity change, appetite change and fever.  HENT: Negative for congestion and rhinorrhea.   Eyes: Negative for discharge.  Respiratory: Negative for cough and wheezing.   Cardiovascular: Negative for chest pain.  Gastrointestinal: Negative for abdominal pain, blood in stool and vomiting.  Genitourinary: Negative for difficulty urinating and frequency.  Musculoskeletal: Negative for neck pain.  Skin: Negative for rash.  Allergic/Immunologic: Negative for environmental allergies and food allergies.    Neurological: Negative for weakness and headaches.  Psychiatric/Behavioral: Negative for agitation.       Objective:   Physical Exam  Constitutional: He appears well-developed and well-nourished.  HENT:  Head: Normocephalic and atraumatic.  Right Ear: External ear normal.  Left Ear: External ear normal.  Nose: Nose normal.  Mouth/Throat: Oropharynx is clear and moist.  Eyes: EOM are normal. Pupils are equal, round, and reactive to light.  Neck: Normal range of motion. Neck supple. No thyromegaly present.  Cardiovascular: Normal rate, regular rhythm and normal heart sounds.   No murmur heard. Pulmonary/Chest: Effort normal and breath sounds normal. No respiratory distress. He has no wheezes.  Abdominal: Soft. Bowel sounds are normal. He exhibits no distension and no mass. There is no tenderness.  Genitourinary: Prostate normal and penis normal.  Musculoskeletal: Normal range of motion. He exhibits no edema.  Lymphadenopathy:    He has no cervical adenopathy.  Neurological: He is alert. He exhibits normal muscle tone.  Skin: Skin is warm and dry. No erythema.  Psychiatric: He has a normal mood and affect. His behavior is normal. Judgment normal.          Assessment & Plan:  Adult wellness-complete.wellness physical was conducted today. Importance of diet and exercise were discussed in detail. In addition to this a discussion regarding safety was also covered. We also reviewed over immunizations and gave recommendations regarding current immunization needed for age. In addition to this additional areas were also touched on including: Preventative health exams needed: Colonoscopy Will be due next year His lab work was reviewed with the patient PSA slightly up compared to last year but not worrisome Cholesterol profile overall good LDL 120 goal less than 100 HDL  32 goal forty-year better regular exercise and diet recommended Patient was advised yearly wellness exam Patient  followed by specialist on regular basis for his arthritis Flu vaccine in the fall pneumonia vaccine today

## 2016-02-05 ENCOUNTER — Encounter: Payer: Self-pay | Admitting: Podiatry

## 2016-02-05 ENCOUNTER — Encounter: Payer: Managed Care, Other (non HMO) | Admitting: Podiatry

## 2016-02-05 ENCOUNTER — Ambulatory Visit (INDEPENDENT_AMBULATORY_CARE_PROVIDER_SITE_OTHER): Payer: Managed Care, Other (non HMO) | Admitting: Podiatry

## 2016-02-05 ENCOUNTER — Ambulatory Visit (INDEPENDENT_AMBULATORY_CARE_PROVIDER_SITE_OTHER): Payer: Managed Care, Other (non HMO)

## 2016-02-05 DIAGNOSIS — Z9889 Other specified postprocedural states: Secondary | ICD-10-CM

## 2016-02-05 DIAGNOSIS — M2012 Hallux valgus (acquired), left foot: Secondary | ICD-10-CM

## 2016-02-05 DIAGNOSIS — M2042 Other hammer toe(s) (acquired), left foot: Secondary | ICD-10-CM | POA: Diagnosis not present

## 2016-02-07 NOTE — Progress Notes (Signed)
He presents today for his final postop visit regarding his left foot. Date of surgery was 11/14/2015 [facility] bunion repair with second metatarsal osteotomy and hammertoe repair #2 #3 of the left foot. He states that he is doing just great has no problems. He is very happy with the outcome of his surgery and he is very happy with the surgery center as well as our office.  Objective: Vital signs are stable he is alert and oriented 3. Pulses are palpable. Neurologic sensorium is intact per Semmes-Weinstein monofilament. Deep tendon reflexes are intact. He has great range of motion of the first metatarsophalangeal joint and of the second third digits of the left foot. Surgical sites and on to heal uneventfully and radiograph confirms a well-healed osteotomies and fusion sites.  Assessment: What healing surgical foot left.  Plan: Follow up with him on an as-needed basis. We will consider the contralateral foot next year.

## 2016-02-16 NOTE — Progress Notes (Signed)
DOS 06.23.2017 Steven Glover with Internal Screw Fixation Left Foot; Metatarsal Osteotomy 2nd and 3rd Mets Left; Hammertoe Repair with K-Wire Fixations 2nd and 3rd Toes Left

## 2016-03-17 ENCOUNTER — Ambulatory Visit: Payer: Managed Care, Other (non HMO)

## 2016-04-06 ENCOUNTER — Ambulatory Visit: Payer: Managed Care, Other (non HMO)

## 2016-04-07 ENCOUNTER — Ambulatory Visit (INDEPENDENT_AMBULATORY_CARE_PROVIDER_SITE_OTHER): Payer: Managed Care, Other (non HMO)

## 2016-04-07 DIAGNOSIS — Z23 Encounter for immunization: Secondary | ICD-10-CM

## 2016-04-27 ENCOUNTER — Ambulatory Visit (INDEPENDENT_AMBULATORY_CARE_PROVIDER_SITE_OTHER): Payer: Managed Care, Other (non HMO) | Admitting: Family Medicine

## 2016-04-27 ENCOUNTER — Encounter: Payer: Self-pay | Admitting: Family Medicine

## 2016-04-27 VITALS — BP 122/88 | Temp 98.5°F | Ht 75.0 in | Wt 204.0 lb

## 2016-04-27 DIAGNOSIS — B9689 Other specified bacterial agents as the cause of diseases classified elsewhere: Secondary | ICD-10-CM | POA: Diagnosis not present

## 2016-04-27 DIAGNOSIS — J019 Acute sinusitis, unspecified: Secondary | ICD-10-CM

## 2016-04-27 MED ORDER — DOXYCYCLINE HYCLATE 100 MG PO CAPS: 100.0000 mg | ORAL_CAPSULE | Freq: Two times a day (BID) | ORAL | 0 refills | Status: DC

## 2016-04-27 NOTE — Progress Notes (Signed)
   Subjective:    Patient ID: Steven Glover, male    DOB: Feb 16, 1951, 65 y.o.   MRN: [id number]  Sinusitis  This is a new problem. Episode onset: 2-3 days. Associated symptoms include congestion, coughing, headaches and a sore throat. Pertinent negatives include no ear pain.   Viral like illness for a few days and was sinus pressure discomfort drainage coughing not feeling good   Review of Systems  Constitutional: Negative for activity change and fever.  HENT: Positive for congestion, rhinorrhea and sore throat. Negative for ear pain.   Eyes: Negative for discharge.  Respiratory: Positive for cough. Negative for wheezing.   Cardiovascular: Negative for chest pain.  Neurological: Positive for headaches.       Objective:   Physical Exam  Constitutional: He appears well-developed.  HENT:  Head: Normocephalic.  Mouth/Throat: Oropharynx is clear and moist. No oropharyngeal exudate.  Neck: Normal range of motion.  Cardiovascular: Normal rate, regular rhythm and normal heart sounds.   No murmur heard. Pulmonary/Chest: Effort normal and breath sounds normal. He has no wheezes.  Lymphadenopathy:    He has no cervical adenopathy.  Neurological: He exhibits normal muscle tone.  Skin: Skin is warm and dry.  Nursing note and vitals reviewed.         Assessment & Plan:  Viral syndrome Secondary rhinosinusitis Patient was seen today for upper respiratory illness. It is felt that the patient is dealing with sinusitis. Antibiotics were prescribed today. Importance of compliance with medication was discussed. Symptoms should gradually resolve over the course of the next several days. If high fevers, progressive illness, difficulty breathing, worsening condition or failure for symptoms to improve over the next several days then the patient is to follow-up. If any emergent conditions the patient is to follow-up in the emergency department otherwise to follow-up in the office.

## 2016-04-30 ENCOUNTER — Telehealth: Payer: Self-pay | Admitting: Family Medicine

## 2016-04-30 MED ORDER — LEVOFLOXACIN 500 MG PO TABS: ORAL_TABLET | ORAL | 0 refills | Status: DC

## 2016-04-30 NOTE — Telephone Encounter (Signed)
Patient seen on 04/27/16 for acute bacterial rhinosinusitis by Dr. Nicki Reaper.  He was given doxycyline, but Dr. Nicki Reaper told patient to call today if not any better and we would call in something stronger.  He is still having runny nose, lost voice, feeling terrible.  Please advise.   [facility] [place]

## 2016-04-30 NOTE — Telephone Encounter (Signed)
Discontinue doxycycline, recommend Levaquin 1 daily 10 days

## 2016-04-30 NOTE — Telephone Encounter (Signed)
Spoke with patient and informed him per Dr.Scott Luking- Discontinue Doxycycline, recommend Levaquin 1 daily for 10 days. Patient verbalized understanding.

## 2016-05-19 ENCOUNTER — Encounter: Payer: Self-pay | Admitting: Gastroenterology

## 2016-06-23 ENCOUNTER — Encounter: Payer: Self-pay | Admitting: Gastroenterology

## 2016-06-23 ENCOUNTER — Ambulatory Visit (INDEPENDENT_AMBULATORY_CARE_PROVIDER_SITE_OTHER): Payer: Managed Care, Other (non HMO) | Admitting: Gastroenterology

## 2016-06-23 DIAGNOSIS — K219 Gastro-esophageal reflux disease without esophagitis: Secondary | ICD-10-CM | POA: Diagnosis not present

## 2016-06-23 DIAGNOSIS — D696 Thrombocytopenia, unspecified: Secondary | ICD-10-CM | POA: Insufficient documentation

## 2016-06-23 DIAGNOSIS — R1310 Dysphagia, unspecified: Secondary | ICD-10-CM

## 2016-06-23 DIAGNOSIS — R1319 Other dysphagia: Secondary | ICD-10-CM

## 2016-06-23 NOTE — Progress Notes (Signed)
cc'ed to pcp °

## 2016-06-23 NOTE — Assessment & Plan Note (Signed)
SYMPTOMS CONTROLLED/RESOLVED.  CONTINUE TO MONITOR SYMPTOMS. 

## 2016-06-23 NOTE — Assessment & Plan Note (Signed)
ETIOLOGY UNCLEAR, BUT PT IS ON MTX AND IT CAN CAUSE CIRRHOSIS. DIFFERENTIAL DIAGNOSIS INCLUDES CHRONIC HEP C.  CHECK CBC/HEP C Ab. PLEASE ASK DR. BEEKMAN HOW MANY GRAMS OF METHOTREXATE HE HAS RECEIVED. IF YOUR PLATELET COUNT IS STILL LOW, YOU WILL NEED A LIVER ULTRASOUND.  FOLLOW UP IN 1 YEAR.

## 2016-06-23 NOTE — Patient Instructions (Addendum)
PLEASE ASK DR. BEEKMAN HOW MANY GRAMS OF METHOTREXATE YOU HAVE RECEIVED.  COMPLETE YOUR BLOOD DRAW WITHIN 7 DAYS. IF YOUR PLATELET COUNT IS STILL LOW, YOU WILL NEED A LIVER ULTRASOUND.   AVOID REFLUX TRIGGERS. SEE INFO BELOW.  CONTINUE OMEPRAZOLE.  TAKE 30 MINUTES PRIOR TO YOUR FIRST MEAL.  USE PEPCID OR ZANTAC HELP IF NEEDED TO PREVENT HEARTBURN.    PLEASE CALL WITH QUESTIONS OR CONCERNS.  FOLLOW UP IN 1 YEAR.    Lifestyle and home remedies TO HELP CONTROL HEARTBURN.  You may eliminate or reduce the frequency of heartburn by making the following lifestyle changes:  . Control your weight. Being overweight is a major risk factor for heartburn and GERD. Excess pounds put pressure on your abdomen, pushing up your stomach and causing acid to back up into your esophagus.   . Eat smaller meals. 4 TO 6 MEALS A DAY. This reduces pressure on the lower esophageal sphincter, helping to prevent the valve from opening and acid from washing back into your esophagus.   Dolphus Jenny your belt. Clothes that fit tightly around your waist put pressure on your abdomen and the lower esophageal sphincter.  .  . Eliminate heartburn triggers. Everyone has specific triggers. Common triggers such as fatty or fried foods, spicy food, tomato sauce, carbonated beverages, alcohol, chocolate, mint, garlic, onion, caffeine and nicotine may make heartburn worse.   Marland Kitchen Avoid stooping or bending. Tying your shoes is OK. Bending over for longer periods to weed your garden isn't, especially soon after eating.   . Don't lie down after a meal. Wait at least three to four hours after eating before going to bed, and don't lie down right after eating.   Marland Kitchen PLACE THE HEAD OF YOUR BED ON 6 INCH BLOCKS.  Alternative medicine . Several home remedies exist for treating GERD, but they provide only temporary relief. They include drinking baking soda (sodium bicarbonate) added to water or drinking other fluids such as baking soda mixed  with cream of tartar and water. . Although these liquids create temporary relief by neutralizing, washing away or buffering acids, eventually they aggravate the situation by adding gas and fluid to your stomach, increasing pressure and causing more acid reflux. Further, adding more sodium to your diet may increase your blood pressure and add stress to your heart, and excessive bicarbonate ingestion can alter the acid-base balance in your body.

## 2016-06-23 NOTE — Progress Notes (Signed)
Subjective:    Patient ID: Steven Glover, male    DOB: January 29, 1951, 66 y.o.   MRN: [id number]  Steven Lange, MD  HPI GOING ON [employer] TR[place] IN [place], [place]. NOT ABLE TO WORK HIS CAR BECAUSE IT'S COLD. SWALLOWING IS GOOD. REFLUX COMES AND GOES DEPENDING ON WHAT HE EATS. NOW DRINKING ALOE QHS AND NEVER HAS HEARTBURN AT NIGHT. HAS PENDING APPT WITH ENDOCRINOLOGY AND [facility] Q3 [facility]. DOESN'T KNOW TOTAL DOSE FOR METHOTREXATE. NO ABDOMINAL IMAGING.  PT DENIES FEVER, CHILLS, HEMATOCHEZIA, HEMATEMESIS, nausea, vomiting, melena, diarrhea, CHEST PAIN, SHORTNESS OF BREATH,  CHANGE IN BOWEL IN HABITS, constipation, abdominal pain, OR heartburn or indigestion.  Past Medical History:  Diagnosis Date  . Acid reflux disease   . Allergy   . Asthma    as child  . BMI between 19-24,adult JUL 2012 191.8 LBS  . Collagen vascular disease ([facility])   . GERD (gastroesophageal reflux disease)   . Internal hemorrhoids with other complication    TCS DEC 0000000  . Pulmonary nodule 2013   [facility] - Repeat 08/2012  . Rheumatoid arthritis(714.0)    Past Surgical History:  Procedure Laterality Date  . COLONOSCOPY  2009 NUR [place]   MULTIPLE SIMPLE ADENOMAS  . COLONOSCOPY W/ POLYPECTOMY  2010 NUR  . COLONOSCOPY WITH PROPOFOL  05/15/2012   SLF:Two sessile polyps ranging between 3-4mm in size were found in the ascending colon and rectum; multiple biopsies were performed/Mild diverticulosis was noted in the sigmoid colon/The colon mucosa was otherwise normal/ Moderate sized internal hemorrhoids. TCS 04/2017.  Marland Kitchen ESOPHAGOGASTRODUODENOSCOPY  01/01/2011 [id number] CHRONIC GASTRITIS   SLF: Hiatal Hernia/mild gastritis/stricture in the distal esophagus  . POLYPECTOMY  05/15/2012   SIMPLE ADENOMA(2)  . SAVORY DILATION  01/01/2011   Procedure: SAVORY DILATION;  Surgeon: Steven Peng, MD;  Location: [facility];  Service: Endoscopy;  Laterality: N/A;  . SHOULDER OPEN ROTATOR CUFF REPAIR  2005  . WRIST FUSION     Allergies    Allergen Reactions  . Cefprozil     Nausea, felt bad   Current Outpatient Prescriptions  Medication Sig Dispense Refill  . cetirizine (ZYRTEC) 10 MG tablet Take 10 mg by mouth as needed.     . etanercept (ENBREL) 50 MG/ML injection Inject 50 mg into the skin once a week. On Tuesday every week    . folic acid (FOLVITE) 1 MG tablet Take 1 mg by mouth daily.      . methotrexate 2.5 MG tablet Take 10 mg by mouth once a week. Takes 4 tablets once weekly on Thursday    . omeprazole (PRILOSEC) 20 MG capsule Take 20 mg by mouth daily.    .      .      .       Review of Systems PER HPI OTHERWISE ALL SYSTEMS ARE NEGATIVE.    Objective:   Physical Exam  Constitutional: He is oriented to person, place, and time. He appears well-developed and well-nourished. No distress.  HENT:  Head: Normocephalic and atraumatic.  Mouth/Throat: Oropharynx is clear and moist. No oropharyngeal exudate.  Eyes: Pupils are equal, round, and reactive to light. No scleral icterus.  Neck: Normal range of motion. Neck supple.  Cardiovascular: Normal rate, regular rhythm and normal heart sounds.   Pulmonary/Chest: Effort normal and breath sounds normal. No respiratory distress.  Abdominal: Soft. Bowel sounds are normal. He exhibits no distension. There is no tenderness.  Musculoskeletal: He exhibits no edema.  Lymphadenopathy:  He has no cervical adenopathy.  Neurological: He is alert and oriented to person, place, and time.  Psychiatric: He has a normal mood and affect.  Vitals reviewed.     Assessment & Plan:

## 2016-06-23 NOTE — Assessment & Plan Note (Signed)
SYMPTOMS FAIRLY WELL CONTROLLED ON ALOE VERA AND OMEPRAZOLE.   AVOID REFLUX TRIGGERS.  HANDOUT GIVEN. CONTINUE OMEPRAZOLE.  TAKE 30 MINUTES PRIOR TO YOUR FIRST MEAL. USE PEPCID OR ZANTAC HELP IF NEEDED TO PREVENT HEARTBURN. PLEASE CALL WITH QUESTIONS OR CONCERNS.  FOLLOW UP IN 1 YEAR.

## 2016-06-24 ENCOUNTER — Telehealth: Payer: Self-pay

## 2016-06-24 LAB — CBC WITH DIFFERENTIAL/PLATELET
BASOS ABS: 44 {cells}/uL (ref 0–200)
Basophils Relative: 1 %
Eosinophils Absolute: 176 cells/uL (ref 15–500)
Eosinophils Relative: 4 %
HCT: 45 % (ref 38.5–50.0)
Hemoglobin: 15.8 g/dL (ref 13.2–17.1)
LYMPHS ABS: 1452 {cells}/uL (ref 850–3900)
Lymphocytes Relative: 33 %
MCH: 32.8 pg (ref 27.0–33.0)
MCHC: 35.1 g/dL (ref 32.0–36.0)
MCV: 93.4 fL (ref 80.0–100.0)
MONOS PCT: 11 %
MPV: 9.9 fL (ref 7.5–12.5)
Monocytes Absolute: 484 cells/uL (ref 200–950)
NEUTROS ABS: 2244 {cells}/uL (ref 1500–7800)
NEUTROS PCT: 51 %
PLATELETS: 145 10*3/uL (ref 140–400)
RBC: 4.82 MIL/uL (ref 4.20–5.80)
RDW: 14.5 % (ref 11.0–15.0)
WBC: 4.4 10*3/uL (ref 3.8–10.8)

## 2016-06-24 LAB — HEPATITIS C ANTIBODY: HCV AB: NEGATIVE

## 2016-06-24 NOTE — Progress Notes (Signed)
ON RECALL  °

## 2016-06-24 NOTE — Telephone Encounter (Signed)
[place]. EXPLAINED LABS FROM OUTSIDE FACILITY. CURRENTLY NOT AVAILABLE. TOTAL GRAMS UNKNOWN. CURRENT PRACTICE/STANDARD OF CARE AND CONTINUE MTX INDEFINITELY AND ONLY EVALUATE FOR CIRRHOSIS IF LIVER ENZYMES ARE ELEVATED. PT SEEN SINCE 2012 2.5 MG(4) PER WEEK THEN INCREASED TO 2.5 MG(8) FOR A WEEK.  WILL FAX RESULTS TO DR. [id number].

## 2016-06-24 NOTE — Telephone Encounter (Signed)
Noted  

## 2016-06-24 NOTE — Telephone Encounter (Signed)
T/C from Wallis Bamberg , RN at Dr. Melissa Noon office. [phone]).  She called to find out what the pt's platelet count was. She said they last checked his platelets in November 2017 and they were 158. He is scheduled lab work there in March. She said pt does not know how long he has been on Methotrexate, he was on it before he every started seeing Dr. Amil Amen. He saw Dr. Amil Amen at [facility] prior to moving to his office now in August 2016.  She said that pt is supposed to be taking Methotrexate 2.5 , 8 tablets once a week. I told her it was recorded in our chart yesterday that he was just taking 2.5 mg , 4 tablets once a week. She called pt and called me back and pt IS TAKING METHOTREXATE 2.5 #8 TABLETS ONCE A WEEK. She would like a return call about his present platelet count.

## 2016-06-28 NOTE — Progress Notes (Signed)
CC'D TO PCP °

## 2016-08-09 ENCOUNTER — Encounter: Payer: Self-pay | Admitting: Family Medicine

## 2016-08-09 ENCOUNTER — Ambulatory Visit (INDEPENDENT_AMBULATORY_CARE_PROVIDER_SITE_OTHER): Payer: Managed Care, Other (non HMO) | Admitting: Family Medicine

## 2016-08-09 VITALS — BP 120/74 | Ht 75.0 in | Wt 203.2 lb

## 2016-08-09 DIAGNOSIS — J019 Acute sinusitis, unspecified: Secondary | ICD-10-CM

## 2016-08-09 MED ORDER — LEVOFLOXACIN 500 MG PO TABS: ORAL_TABLET | ORAL | 0 refills | Status: DC

## 2016-08-09 NOTE — Progress Notes (Signed)
   Subjective:    Patient ID: Steven Glover, male    DOB: June 08, 1950, 66 y.o.   MRN: [id number]  HPI A Rosita Fire had about 4 day history of head congestion drainage coughing some sinus pressure as well now with sinus pressure pain discomfort in the frontal maxillary sinuses denies high fever chills wheezing or difficulty breathing but does state he feels fatigued PMH rheumatoid all medications frequent allergies frequent sinus infections  Review of Systems  Constitutional: Negative for activity change and fever.  HENT: Positive for congestion and rhinorrhea. Negative for ear pain.   Eyes: Negative for discharge.  Respiratory: Positive for cough. Negative for wheezing.   Cardiovascular: Negative for chest pain.       Objective:   Physical Exam  Constitutional: He appears well-developed.  HENT:  Head: Normocephalic.  Mouth/Throat: Oropharynx is clear and moist. No oropharyngeal exudate.  Neck: Normal range of motion.  Cardiovascular: Normal rate, regular rhythm and normal heart sounds.   No murmur heard. Pulmonary/Chest: Effort normal and breath sounds normal. He has no wheezes.  Lymphadenopathy:    He has no cervical adenopathy.  Neurological: He exhibits normal muscle tone.  Skin: Skin is warm and dry.  Nursing note and vitals reviewed. Patient does not appear toxic        Assessment & Plan:  Viral syndrome Secondary rhinosinusitis Antibiotic prescribed warning signs discussed Follow-up if progressive troubles

## 2016-08-09 NOTE — Patient Instructions (Signed)

## 2016-11-01 ENCOUNTER — Ambulatory Visit (INDEPENDENT_AMBULATORY_CARE_PROVIDER_SITE_OTHER): Payer: Managed Care, Other (non HMO) | Admitting: Nurse Practitioner

## 2016-11-01 ENCOUNTER — Encounter: Payer: Self-pay | Admitting: Family Medicine

## 2016-11-01 ENCOUNTER — Encounter: Payer: Self-pay | Admitting: Nurse Practitioner

## 2016-11-01 VITALS — BP 128/80 | Temp 99.8°F | Ht 75.0 in | Wt 199.0 lb

## 2016-11-01 DIAGNOSIS — B9689 Other specified bacterial agents as the cause of diseases classified elsewhere: Secondary | ICD-10-CM

## 2016-11-01 DIAGNOSIS — J069 Acute upper respiratory infection, unspecified: Secondary | ICD-10-CM | POA: Diagnosis not present

## 2016-11-01 MED ORDER — AMOXICILLIN-POT CLAVULANATE 875-125 MG PO TABS: 1.0000 | ORAL_TABLET | Freq: Two times a day (BID) | ORAL | 0 refills | Status: DC

## 2016-11-01 NOTE — Progress Notes (Signed)
Subjective:  Presents with his wife for complaints of facial area headache possible fever that began 3 days ago. Has felt chills and hot times. Rare cough. Postnasal drainage. Yellow mucus. No wheezing or pain or sore throat. Slight nausea but no vomiting. Taking fluids well. Voiding normal limit. Also dizziness especially with sudden position change. No visual changes. No numbness or weakness of the face arms or legs. No difficulty speaking or swallowing. Generalized malaise.  Objective:   BP 128/80   Temp 99.8 F (37.7 C)   Ht 6\' 3"  (1.905 m)   Wt 199 lb (90.3 kg)   BMI 24.87 kg/m  NAD. Alert, oriented. TMs retracted bilaterally, no erythema. Pharynx mildly injected with green PND noted. Neck supple with mild anterior adenopathy. Lungs clear. Heart regular rate rhythm. Pupils equal and reactive to light. EOMs intact without nystagmus. Point-to-point localization normal limit. Reflexes normal limit. Hand strength 5+ bilateral. Romberg negative.  Assessment:  Bacterial upper respiratory infection Secondary mild vertigo most likely related to inner ear dysfunction  Plan:   Meds ordered this encounter  Medications  . amoxicillin-clavulanate (AUGMENTIN) 875-125 MG tablet    Sig: Take 1 tablet by mouth 2 (two) times daily.    Dispense:  20 tablet    Refill:  0    Order Specific Question:   Supervising Provider    Answer:   Mikey Kirschner [[id number]]   OTC meclizine as directed for dizziness. Expect gradual resolution of symptoms. Also patient has been on several antibiotics recently, strongly recommend OTC bowel probiotic as directed. Call back in 72 hours if no improvement, sooner if worse. Warning signs reviewed.

## 2016-11-01 NOTE — Patient Instructions (Signed)
Meclizine as directed for dizziness Recommend daily bowel probiotic (align or activia yogurt)

## 2016-11-03 ENCOUNTER — Other Ambulatory Visit (HOSPITAL_COMMUNITY)
Admission: RE | Admit: 2016-11-03 | Discharge: 2016-11-03 | Disposition: A | Discharge status: Home / Self Care | Payer: Managed Care, Other (non HMO) | Source: Ambulatory Visit | Attending: Nurse Practitioner | Admitting: Nurse Practitioner

## 2016-11-03 ENCOUNTER — Ambulatory Visit (INDEPENDENT_AMBULATORY_CARE_PROVIDER_SITE_OTHER): Payer: Managed Care, Other (non HMO) | Admitting: Nurse Practitioner

## 2016-11-03 ENCOUNTER — Telehealth: Payer: Self-pay | Admitting: Nurse Practitioner

## 2016-11-03 ENCOUNTER — Encounter: Payer: Self-pay | Admitting: Nurse Practitioner

## 2016-11-03 VITALS — BP 118/76 | Temp 101.9°F | Ht 75.0 in | Wt 194.0 lb

## 2016-11-03 DIAGNOSIS — R509 Fever, unspecified: Secondary | ICD-10-CM

## 2016-11-03 LAB — CBC WITH DIFFERENTIAL/PLATELET
BASOS PCT: 0 %
Basophils Absolute: 0 10*3/uL (ref 0.0–0.1)
EOS ABS: 0.1 10*3/uL (ref 0.0–0.7)
EOS PCT: 1 %
HCT: 42.3 % (ref 39.0–52.0)
HEMOGLOBIN: 15.1 g/dL (ref 13.0–17.0)
LYMPHS ABS: 1.1 10*3/uL (ref 0.7–4.0)
Lymphocytes Relative: 13 %
MCH: 32.5 pg (ref 26.0–34.0)
MCHC: 35.7 g/dL (ref 30.0–36.0)
MCV: 91 fL (ref 78.0–100.0)
Monocytes Absolute: 1.9 10*3/uL — ABNORMAL HIGH (ref 0.1–1.0)
Monocytes Relative: 22 %
NEUTROS PCT: 64 %
Neutro Abs: 5.7 10*3/uL (ref 1.7–7.7)
PLATELETS: 124 10*3/uL — AB (ref 150–400)
RBC: 4.65 MIL/uL (ref 4.22–5.81)
RDW: 14 % (ref 11.5–15.5)
WBC: 8.8 10*3/uL (ref 4.0–10.5)

## 2016-11-03 LAB — BASIC METABOLIC PANEL
Anion gap: 8 (ref 5–15)
BUN: 14 mg/dL (ref 6–20)
CO2: 25 mmol/L (ref 22–32)
CREATININE: 1.15 mg/dL (ref 0.61–1.24)
Calcium: 8.8 mg/dL — ABNORMAL LOW (ref 8.9–10.3)
Chloride: 102 mmol/L (ref 101–111)
GFR calc Af Amer: >60 mL/min (ref >60)
Glucose, Bld: 89 mg/dL (ref 65–99)
Potassium: 3.8 mmol/L (ref 3.5–5.1)
SODIUM: 135 mmol/L (ref 135–145)

## 2016-11-03 LAB — HEPATIC FUNCTION PANEL
ALBUMIN: 3.6 g/dL (ref 3.5–5.0)
ALT: 24 U/L (ref 17–63)
AST: 24 U/L (ref 15–41)
Alkaline Phosphatase: 74 U/L (ref 38–126)
BILIRUBIN INDIRECT: 1 mg/dL — AB (ref 0.3–0.9)
Bilirubin, Direct: 0.2 mg/dL (ref 0.1–0.5)
TOTAL PROTEIN: 7.4 g/dL (ref 6.5–8.1)
Total Bilirubin: 1.2 mg/dL (ref 0.3–1.2)

## 2016-11-03 MED ORDER — CEFTRIAXONE SODIUM 1 G IJ SOLR
1.0000 g | Freq: Once | INTRAMUSCULAR | Status: AC
  Administered 2016-11-03: 1 g via INTRAMUSCULAR

## 2016-11-03 MED ORDER — DOXYCYCLINE HYCLATE 100 MG PO TABS: 100.0000 mg | ORAL_TABLET | Freq: Two times a day (BID) | ORAL | 0 refills | Status: DC

## 2016-11-03 NOTE — Telephone Encounter (Signed)
Spoke with patient and patient stated that he does not have any new symptoms. Fever has not improved , no history of  Recent tick bites. Informed him to go to the Hospital lab and have BMET, CBC, Hepatic function panel done and patient scheduled an appointment for this afternoon per [facility]

## 2016-11-03 NOTE — Progress Notes (Signed)
Subjective:  Presents for recheck. See previous note 6/11. Fever has gone up slightly to 102. Shakes and sweats at night to the point he cannot sleep. Slight dull headache. No joint pain or rash. No recent tick bites. No neck stiffness. No N/V or abd pain. No urinary symptoms. No cough. No chest pain. No difficulty with urinary stream or pain with defecation. Taking fluids well.   Objective:   BP 118/76   Temp (!) 101.9 F (38.8 C) (Oral)   Ht 6\' 3"  (1.905 m)   Wt 194 lb (88 kg)   BMI 24.25 kg/m  NAD. Alert, oriented. TMs mild clear effusion. Pharynx clear and moist. Neck supple with minimal anterior adenopathy. Lungs clear. No wheezing or tachypnea. Heart RRR. Abdomen soft, non distended, non tender. Results for orders placed or performed during the hospital encounter of 11/03/16  CBC with Differential/Platelet  Result Value Ref Range   WBC 8.8 4.0 - 10.5 K/uL   RBC 4.65 4.22 - 5.81 MIL/uL   Hemoglobin 15.1 13.0 - 17.0 g/dL   HCT 42.3 39.0 - 52.0 %   MCV 91.0 78.0 - 100.0 fL   MCH 32.5 26.0 - 34.0 pg   MCHC 35.7 30.0 - 36.0 g/dL   RDW 14.0 11.5 - 15.5 %   Platelets 124 (L) 150 - 400 K/uL   Neutrophils Relative % 64 %   Neutro Abs 5.7 1.7 - 7.7 K/uL   Lymphocytes Relative 13 %   Lymphs Abs 1.1 0.7 - 4.0 K/uL   Monocytes Relative 22 %   Monocytes Absolute 1.9 (H) 0.1 - 1.0 K/uL   Eosinophils Relative 1 %   Eosinophils Absolute 0.1 0.0 - 0.7 K/uL   Basophils Relative 0 %   Basophils Absolute 0.0 0.0 - 0.1 K/uL  Basic metabolic panel  Result Value Ref Range   Sodium 135 135 - 145 mmol/L   Potassium 3.8 3.5 - 5.1 mmol/L   Chloride 102 101 - 111 mmol/L   CO2 25 22 - 32 mmol/L   Glucose, Bld 89 65 - 99 mg/dL   BUN 14 6 - 20 mg/dL   Creatinine, Ser 1.15 0.61 - 1.24 mg/dL   Calcium 8.8 (L) 8.9 - 10.3 mg/dL   GFR calc non Af Amer >60 >60 mL/min   GFR calc Af Amer >60 >60 mL/min   Anion gap 8 5 - 15  Hepatic function panel  Result Value Ref Range   Total Protein 7.4 6.5 - 8.1  g/dL   Albumin 3.6 3.5 - 5.0 g/dL   AST 24 15 - 41 U/L   ALT 24 17 - 63 U/L   Alkaline Phosphatase 74 38 - 126 U/L   Total Bilirubin 1.2 0.3 - 1.2 mg/dL   Bilirubin, Direct 0.2 0.1 - 0.5 mg/dL   Indirect Bilirubin 1.0 (H) 0.3 - 0.9 mg/dL     Assessment: Febrile illness - Plan: cefTRIAXone (ROCEPHIN) injection 1 g  Possible tick disease   Plan:   Meds ordered this encounter  Medications  . doxycycline (VIBRA-TABS) 100 MG tablet    Sig: Take 1 tablet (100 mg total) by mouth 2 (two) times daily.    Dispense:  42 tablet    Refill:  0    Order Specific Question:   Supervising Provider    Answer:   Mikey Kirschner [[id number]]  . cefTRIAXone (ROCEPHIN) injection 1 g    Order Specific Question:   Antibiotic Indication:    Answer:   Other Indication (list below)  Order Specific Question:   Other Indication:    Answer:   febrile illness   Stop Augmentin. Reviewed labs and notes with Dr. Richardson Landry. Agree to switch to Doxycyline to cover tick disease. Warning signs reviewed with patient and his wife. Recheck in 48 hours, call back sooner or go to ED if worse or new symptoms develop.

## 2016-11-03 NOTE — Telephone Encounter (Signed)
First please ask if he is having any other new symptoms. Has the fever improved at all? Any history of tick bite?  If no change: Recommend stat labs to include CBC with diff, met 7 and liver profile. Also chest xray. See if he can get these done this morning with an appointment this afternoon. Please see me if you have any questions. Thanks.

## 2016-11-03 NOTE — Telephone Encounter (Signed)
Pt called stating that he is not getting any better. Pt still has a fever. Pt was seen on Monday by Hoyle Sauer. Please advise.    [facility]

## 2016-11-05 ENCOUNTER — Encounter: Payer: Self-pay | Admitting: Nurse Practitioner

## 2016-11-05 ENCOUNTER — Encounter: Payer: Self-pay | Admitting: Family Medicine

## 2016-11-05 ENCOUNTER — Ambulatory Visit (INDEPENDENT_AMBULATORY_CARE_PROVIDER_SITE_OTHER): Payer: Managed Care, Other (non HMO) | Admitting: Nurse Practitioner

## 2016-11-05 VITALS — BP 122/80 | Temp 97.8°F | Ht 75.0 in | Wt 194.0 lb

## 2016-11-05 DIAGNOSIS — R509 Fever, unspecified: Secondary | ICD-10-CM

## 2016-11-05 NOTE — Progress Notes (Signed)
Subjective:  Presents for recheck see previous notes. Fever has resolved. Sleeping without difficulty. States "I slept like a baby" last night. No change in symptoms. Currently on doxycycline. Tolerating without difficulty.  Objective:   BP 122/80   Temp 97.8 F (36.6 C)   Ht 6\' 3"  (1.905 m)   Wt 194 lb 0.2 oz (88 kg)   BMI 24.25 kg/m  NAD. Alert, oriented. TMs mildly retracted, no erythema. There next clear. Neck supple with mild adenopathy. Lungs clear. Heart regular rate rhythm.  Assessment:  Febrile illness resolving    Plan:  Complete doxycycline as directed. Again reviewed warning signs. Call back next week if no improvement, sooner if worse. Expect continued gradual resolution of symptoms.

## 2016-11-08 ENCOUNTER — Encounter: Payer: Self-pay | Admitting: Family Medicine

## 2016-11-11 ENCOUNTER — Telehealth: Payer: Self-pay | Admitting: Nurse Practitioner

## 2016-11-11 NOTE — Telephone Encounter (Signed)
MESSAGE FOR CAROLYN- patient dropped FMLA this morning please review and fill in if need be. Date/sign in box.

## 2016-11-12 NOTE — Telephone Encounter (Signed)
Done

## 2017-02-14 ENCOUNTER — Telehealth: Payer: Self-pay | Admitting: Family Medicine

## 2017-02-14 DIAGNOSIS — D696 Thrombocytopenia, unspecified: Secondary | ICD-10-CM

## 2017-02-14 DIAGNOSIS — E785 Hyperlipidemia, unspecified: Secondary | ICD-10-CM

## 2017-02-14 DIAGNOSIS — Z79899 Other long term (current) drug therapy: Secondary | ICD-10-CM

## 2017-02-14 DIAGNOSIS — Z125 Encounter for screening for malignant neoplasm of prostate: Secondary | ICD-10-CM

## 2017-02-14 NOTE — Telephone Encounter (Signed)
Blood work ordered in Epic. Patient notified. 

## 2017-02-14 NOTE — Telephone Encounter (Signed)
CBC, lipid, met 7, liver-PSA-hyperlipidemia, hypocalcemia, thrombocytopenia, prostate cancer screening

## 2017-02-14 NOTE — Telephone Encounter (Signed)
Pt is requesting lab orders to be sent over for an upcoming physical with Dr. Nicki Reaper. Last labs per epic were: Hepatic,bmp,and cbc on 11/03/16.

## 2017-02-17 LAB — LIPID PANEL
CHOLESTEROL TOTAL: 151 mg/dL (ref 100–199)
Chol/HDL Ratio: 5.2 ratio — ABNORMAL HIGH (ref 0.0–5.0)
HDL: 29 mg/dL — AB (ref >39)
LDL Calculated: 76 mg/dL (ref 0–99)
Triglycerides: 232 mg/dL — ABNORMAL HIGH (ref 0–149)
VLDL CHOLESTEROL CAL: 46 mg/dL — AB (ref 5–40)

## 2017-02-17 LAB — HEPATIC FUNCTION PANEL
ALBUMIN: 4 g/dL (ref 3.6–4.8)
ALK PHOS: 83 IU/L (ref 39–117)
ALT: 22 IU/L (ref 0–44)
AST: 27 IU/L (ref 0–40)
BILIRUBIN, DIRECT: 0.17 mg/dL (ref 0.00–0.40)
Bilirubin Total: 0.6 mg/dL (ref 0.0–1.2)
Total Protein: 6.3 g/dL (ref 6.0–8.5)

## 2017-02-17 LAB — BASIC METABOLIC PANEL
BUN/Creatinine Ratio: 17 (ref 10–24)
BUN: 16 mg/dL (ref 8–27)
CO2: 22 mmol/L (ref 20–29)
CREATININE: 0.95 mg/dL (ref 0.76–1.27)
Calcium: 9 mg/dL (ref 8.6–10.2)
Chloride: 106 mmol/L (ref 96–106)
GFR calc non Af Amer: 82 mL/min/{1.73_m2} (ref >59)
GFR, EST AFRICAN AMERICAN: 95 mL/min/{1.73_m2} (ref >59)
GLUCOSE: 86 mg/dL (ref 65–99)
Potassium: 3.9 mmol/L (ref 3.5–5.2)
SODIUM: 141 mmol/L (ref 134–144)

## 2017-02-17 LAB — CBC WITH DIFFERENTIAL/PLATELET
BASOS: 1 %
Basophils Absolute: 0 10*3/uL (ref 0.0–0.2)
EOS (ABSOLUTE): 0.1 10*3/uL (ref 0.0–0.4)
EOS: 3 %
HEMATOCRIT: 41.9 % (ref 37.5–51.0)
HEMOGLOBIN: 14.6 g/dL (ref 13.0–17.7)
Immature Grans (Abs): 0 10*3/uL (ref 0.0–0.1)
Immature Granulocytes: 0 %
LYMPHS ABS: 1.5 10*3/uL (ref 0.7–3.1)
Lymphs: 39 %
MCH: 33.1 pg — AB (ref 26.6–33.0)
MCHC: 34.8 g/dL (ref 31.5–35.7)
MCV: 95 fL (ref 79–97)
MONOCYTES: 11 %
MONOS ABS: 0.4 10*3/uL (ref 0.1–0.9)
NEUTROS ABS: 1.8 10*3/uL (ref 1.4–7.0)
Neutrophils: 46 %
Platelets: 142 10*3/uL — ABNORMAL LOW (ref 150–379)
RBC: 4.41 x10E6/uL (ref 4.14–5.80)
RDW: 15.5 % — AB (ref 12.3–15.4)
WBC: 3.9 10*3/uL (ref 3.4–10.8)

## 2017-02-17 LAB — PSA: Prostate Specific Ag, Serum: 2.5 ng/mL (ref 0.0–4.0)

## 2017-02-24 ENCOUNTER — Ambulatory Visit (INDEPENDENT_AMBULATORY_CARE_PROVIDER_SITE_OTHER): Payer: Managed Care, Other (non HMO)

## 2017-02-24 DIAGNOSIS — Z23 Encounter for immunization: Secondary | ICD-10-CM | POA: Diagnosis not present

## 2017-03-03 ENCOUNTER — Encounter: Payer: Self-pay | Admitting: Family Medicine

## 2017-03-03 ENCOUNTER — Ambulatory Visit (INDEPENDENT_AMBULATORY_CARE_PROVIDER_SITE_OTHER): Payer: Managed Care, Other (non HMO) | Admitting: Family Medicine

## 2017-03-03 VITALS — BP 138/84 | Ht 72.75 in | Wt 198.0 lb

## 2017-03-03 DIAGNOSIS — Z1211 Encounter for screening for malignant neoplasm of colon: Secondary | ICD-10-CM

## 2017-03-03 DIAGNOSIS — Z0000 Encounter for general adult medical examination without abnormal findings: Secondary | ICD-10-CM | POA: Diagnosis not present

## 2017-03-03 NOTE — Progress Notes (Signed)
   Subjective:    Patient ID: TYRIK STETZER, male    DOB: June 10, 1950, 66 y.o.   MRN: [id number]  HPI The patient comes in today for a wellness visit.    A review of their health history was completed.  A review of medications was also completed.  Any needed refills; none  Eating habits: health conscious  Falls/  MVA accidents in past few months: none  Regular exercise: yes  Specialist pt sees on regular basis: Dr. Amil Amen - [facility]  Preventative health issues were discussed.   Additional concerns: none    Review of Systems  Constitutional: Negative for activity change, appetite change and fatigue.  HENT: Negative for congestion.   Respiratory: Negative for cough.   Cardiovascular: Negative for chest pain.  Gastrointestinal: Negative for abdominal pain.  Endocrine: Negative for polydipsia and polyphagia.  Neurological: Negative for weakness.  Psychiatric/Behavioral: Negative for confusion.       Objective:   Physical Exam  Constitutional: He appears well-developed and well-nourished.  HENT:  Head: Normocephalic and atraumatic.  Right Ear: External ear normal.  Left Ear: External ear normal.  Nose: Nose normal.  Mouth/Throat: Oropharynx is clear and moist.  Eyes: Pupils are equal, round, and reactive to light. EOM are normal.  Neck: Normal range of motion. Neck supple. No thyromegaly present.  Cardiovascular: Normal rate, regular rhythm and normal heart sounds.   No murmur heard. Pulmonary/Chest: Effort normal and breath sounds normal. No respiratory distress. He has no wheezes.  Abdominal: Soft. Bowel sounds are normal. He exhibits no distension and no mass. There is no tenderness.  Genitourinary: Prostate normal and penis normal.  Musculoskeletal: Normal range of motion. He exhibits no edema.  Lymphadenopathy:    He has no cervical adenopathy.  Neurological: He is alert. He exhibits normal muscle tone.  Skin: Skin is warm and dry. No erythema.  Psychiatric:  He has a normal mood and affect. His behavior is normal. Judgment normal.          Assessment & Plan:  Adult wellness-complete.wellness physical was conducted today. Importance of diet and exercise were discussed in detail. In addition to this a discussion regarding safety was also covered. We also reviewed over immunizations and gave recommendations regarding current immunization needed for age. In addition to this additional areas were also touched on including: Preventative health exams needed: Colonoscopy December of this year  Patient was advised yearly wellness exam  Patient's cholesterol LDL looks good but HDL significantly low genetic exercise dietary measures recommended  Rheumatologic stable will send a copy of labs to rheumatology

## 2017-03-04 ENCOUNTER — Encounter: Payer: Self-pay | Admitting: Family Medicine

## 2017-03-21 ENCOUNTER — Telehealth: Payer: Self-pay

## 2017-03-21 NOTE — Telephone Encounter (Signed)
Pt received letter from DS to be triaged. He said he can't call between 130-430pm because he works second shift. Please call him at [phone]

## 2017-03-28 ENCOUNTER — Telehealth: Payer: Self-pay

## 2017-03-28 NOTE — Telephone Encounter (Signed)
See separate triage.  

## 2017-03-29 NOTE — Telephone Encounter (Signed)
Gastroenterology Pre-Procedure Review  Request Date: 03/28/2017 Requesting Physician: Dr. [name]  PATIENT REVIEW QUESTIONS: The patient responded to the following health history questions as indicated:    Last colonoscopy 12/23/203 by Dr. Oneida Alar Hx of polyps  1. Diabetes Melitis: no 2. Joint replacements in the past 12 months: no 3. Major health problems in the past 3 months: no 4. Has an artificial valve or MVP: no 5. Has a defibrillator: no 6. Has been advised in past to take antibiotics in advance of a procedure like teeth cleaning: no 7. Family history of colon cancer: no  8. Alcohol Use: no 9. History of sleep apnea: no  10. History of coronary artery or other vascular stents placed within the last 12 months: no 11. History of any prior anesthesia complications: no    MEDICATIONS & ALLERGIES:    Patient reports the following regarding taking any blood thinners:   Plavix? no Aspirin? no Coumadin? no Brilinta? no Xarelto? no Eliquis? no Pradaxa? no Savaysa? no Effient? no  Patient confirms/reports the following medications:  Current Outpatient Medications  Medication Sig Dispense Refill  . etanercept (ENBREL) 50 MG/ML injection Inject 50 mg into the skin once a week. On Tuesday every week    . folic acid (FOLVITE) 1 MG tablet Take 1 mg by mouth daily.      . methotrexate 2.5 MG tablet Take 2.5 mg by mouth once a week. Takes 8 tablets once weekly on Mondays    . omeprazole (PRILOSEC) 20 MG capsule Take 20 mg by mouth daily.     No current facility-administered medications for this visit.     Patient confirms/reports the following allergies:  Allergies  Allergen Reactions  . Cefprozil     Nausea, felt bad    No orders of the defined types were placed in this encounter.   AUTHORIZATION INFORMATION Primary Insurance:   ID #:   Group #:  Pre-Cert / Auth required: Pre-Cert / Auth #:   Secondary Insurance:   ID #:   Group #:  Pre-Cert / Auth  required: Pre-Cert / Auth #:   SCHEDULE INFORMATION: Procedure has been scheduled as follows:  Date: 05/02/2017           Time:  10:30 AM Location: [facility] Short Stay  This Gastroenterology Pre-Precedure Review Form is being routed to the following provider(s): Barney Drain, MD

## 2017-04-01 NOTE — Telephone Encounter (Signed)
Appropriate.

## 2017-04-06 ENCOUNTER — Other Ambulatory Visit: Payer: Self-pay

## 2017-04-06 DIAGNOSIS — Z86010 Personal history of colonic polyps: Secondary | ICD-10-CM

## 2017-04-06 MED ORDER — PEG 3350-KCL-NA BICARB-NACL 420 G PO SOLR: 4000.0000 mL | ORAL | 0 refills | Status: DC

## 2017-04-06 NOTE — Telephone Encounter (Signed)
Rx sent to the pharmacy and instructions mailed to pt.  

## 2017-05-02 ENCOUNTER — Other Ambulatory Visit: Payer: Self-pay

## 2017-05-02 ENCOUNTER — Ambulatory Visit (HOSPITAL_COMMUNITY)
Admission: RE | Admit: 2017-05-02 | Discharge: 2017-05-02 | Disposition: A | Discharge status: Home Health | Payer: Managed Care, Other (non HMO) | Source: Ambulatory Visit | Attending: Gastroenterology | Admitting: Gastroenterology

## 2017-05-02 ENCOUNTER — Encounter (HOSPITAL_COMMUNITY): Payer: Self-pay | Admitting: *Deleted

## 2017-05-02 ENCOUNTER — Encounter (HOSPITAL_COMMUNITY)
Admission: RE | Disposition: A | Discharge status: Home Health | Payer: Self-pay | Source: Ambulatory Visit | Attending: Gastroenterology

## 2017-05-02 DIAGNOSIS — K644 Residual hemorrhoidal skin tags: Secondary | ICD-10-CM | POA: Insufficient documentation

## 2017-05-02 DIAGNOSIS — K219 Gastro-esophageal reflux disease without esophagitis: Secondary | ICD-10-CM | POA: Insufficient documentation

## 2017-05-02 DIAGNOSIS — Z860100 Personal history of colon polyps, unspecified: Secondary | ICD-10-CM

## 2017-05-02 DIAGNOSIS — D124 Benign neoplasm of descending colon: Secondary | ICD-10-CM | POA: Insufficient documentation

## 2017-05-02 DIAGNOSIS — K5730 Diverticulosis of large intestine without perforation or abscess without bleeding: Secondary | ICD-10-CM | POA: Insufficient documentation

## 2017-05-02 DIAGNOSIS — D128 Benign neoplasm of rectum: Secondary | ICD-10-CM

## 2017-05-02 DIAGNOSIS — D123 Benign neoplasm of transverse colon: Secondary | ICD-10-CM | POA: Diagnosis not present

## 2017-05-02 DIAGNOSIS — Z79899 Other long term (current) drug therapy: Secondary | ICD-10-CM | POA: Insufficient documentation

## 2017-05-02 DIAGNOSIS — D120 Benign neoplasm of cecum: Secondary | ICD-10-CM | POA: Insufficient documentation

## 2017-05-02 DIAGNOSIS — M069 Rheumatoid arthritis, unspecified: Secondary | ICD-10-CM | POA: Diagnosis not present

## 2017-05-02 DIAGNOSIS — Z1211 Encounter for screening for malignant neoplasm of colon: Secondary | ICD-10-CM | POA: Insufficient documentation

## 2017-05-02 DIAGNOSIS — D122 Benign neoplasm of ascending colon: Secondary | ICD-10-CM | POA: Diagnosis not present

## 2017-05-02 HISTORY — PX: COLONOSCOPY: SHX5424

## 2017-05-02 SURGERY — COLONOSCOPY
Anesthesia: Moderate Sedation

## 2017-05-02 MED ORDER — SODIUM CHLORIDE 0.9 % IV SOLN
INTRAVENOUS | Status: DC
  Administered 2017-05-02: 10:00:00 via INTRAVENOUS

## 2017-05-02 MED ORDER — MEPERIDINE HCL 100 MG/ML IJ SOLN
INTRAMUSCULAR | Status: DC | PRN
  Administered 2017-05-02: 25 mg via INTRAVENOUS
  Administered 2017-05-02: 50 mg via INTRAVENOUS

## 2017-05-02 MED ORDER — MIDAZOLAM HCL 5 MG/5ML IJ SOLN
INTRAMUSCULAR | Status: DC | PRN
  Administered 2017-05-02 (×2): 2 mg via INTRAVENOUS
  Administered 2017-05-02: 1 mg via INTRAVENOUS

## 2017-05-02 MED ORDER — SODIUM CHLORIDE 0.9% FLUSH
INTRAVENOUS | Status: DC
Start: 2017-05-02 — End: 2017-05-02
  Filled 2017-05-02: qty 10

## 2017-05-02 MED ORDER — MIDAZOLAM HCL 5 MG/5ML IJ SOLN
INTRAMUSCULAR | Status: AC
  Filled 2017-05-02: qty 10

## 2017-05-02 MED ORDER — MEPERIDINE HCL 100 MG/ML IJ SOLN
INTRAMUSCULAR | Status: AC
  Filled 2017-05-02: qty 2

## 2017-05-02 MED ORDER — PROMETHAZINE HCL 25 MG/ML IJ SOLN
INTRAMUSCULAR | Status: DC | PRN
  Administered 2017-05-02: 12.5 mg via INTRAVENOUS

## 2017-05-02 MED ORDER — PROMETHAZINE HCL 25 MG/ML IJ SOLN
INTRAMUSCULAR | Status: AC
  Filled 2017-05-02: qty 1

## 2017-05-02 NOTE — H&P (Signed)
Primary Care Physician:  Kathyrn Drown, MD Primary Gastroenterologist:  Dr. Oneida Alar  Pre-Procedure History & Physical: HPI:  Steven Glover is a 66 y.o. male here for  PERSONAL HISTORY OF POLYPS.  Past Medical History:  Diagnosis Date  . Acid reflux disease   . Allergy   . Asthma    as child  . BMI between 19-24,adult JUL 2012 191.8 LBS  . Collagen vascular disease ([place])   . GERD (gastroesophageal reflux disease)   . Internal hemorrhoids with other complication    TCS DEC 5176  . Pulmonary nodule 2013   [facility] - Repeat 08/2012  . Rheumatoid arthritis(714.0)     Past Surgical History:  Procedure Laterality Date  . COLONOSCOPY  2009 NUR [place]   MULTIPLE SIMPLE ADENOMAS  . COLONOSCOPY W/ POLYPECTOMY  2010 NUR  . COLONOSCOPY WITH PROPOFOL  05/15/2012   SLF:Two sessile polyps ranging between 3-53mm in size were found in the ascending colon and rectum; multiple biopsies were performed/Mild diverticulosis was noted in the sigmoid colon/The colon mucosa was otherwise normal/ Moderate sized internal hemorrhoids. TCS 04/2017.  Marland Kitchen ESOPHAGOGASTRODUODENOSCOPY  01/01/2011 HY:WVPX CHRONIC GASTRITIS   SLF: Hiatal Hernia/mild gastritis/stricture in the distal esophagus  . POLYPECTOMY  05/15/2012   SIMPLE ADENOMA(2)  . SAVORY DILATION  01/01/2011   Procedure: SAVORY DILATION;  Surgeon: Dorothyann Peng, MD;  Location: [facility];  Service: Endoscopy;  Laterality: N/A;  . SHOULDER OPEN ROTATOR CUFF REPAIR  2005  . WRIST FUSION      Prior to Admission medications   Medication Sig Start Date End Date Taking? Authorizing Provider  etanercept (ENBREL) 50 MG/ML injection Inject 50 mg into the skin every Monday.    Yes [provider]  fexofenadine (ALLERGY RELIEF) 180 MG tablet Take 180 mg by mouth daily as needed for allergies.   Yes [provider]  folic acid (FOLVITE) 1 MG tablet Take 1 mg by mouth daily.     Yes [provider]  methotrexate 2.5 MG tablet Take 20  mg by mouth every Monday. 8 tablets-Monday   Yes [provider]  omeprazole (PRILOSEC) 20 MG capsule Take 20 mg by mouth daily.   Yes [provider]  polyethylene glycol-electrolytes (TRILYTE) 420 g solution Take 4,000 mLs as directed by mouth. 04/06/17  Yes Annitta Needs, NP    Allergies as of 04/06/2017 - Review Complete 03/28/2017  Allergen Reaction Noted  . Cefprozil  02/21/2015    Family History  Problem Relation Age of Onset  . Diabetes Father   . Cancer Sister        breast  . Colon cancer Neg Hx   . Colon polyps Neg Hx     Social History   Socioeconomic History  . Marital status: Married    Spouse name: Not on file  . Number of children: Not on file  . Years of education: Not on file  . Highest education level: Not on file  Social Needs  . Financial resource strain: Not on file  . Food insecurity - worry: Not on file  . Food insecurity - inability: Not on file  . Transportation needs - medical: Not on file  . Transportation needs - non-medical: Not on file  Occupational History  . Not on file  Tobacco Use  . Smoking status: Never Smoker  . Smokeless tobacco: Never Used  Substance and Sexual Activity  . Alcohol use: No  . Drug use: No  . Sexual activity: Yes  Birth control/protection: None  Other Topics Concern  . Not on file  Social History Narrative   MARRIED-WIFE Chilton Si (ED [facility]), 2 CHILDREN    Review of Systems: See HPI, otherwise negative ROS   Physical Exam: BP 125/76   Pulse 78   Temp 98.6 F (37 C) (Oral)   Resp 13   Ht 6\' 3"  (1.905 m)   Wt 198 lb (89.8 kg)   SpO2 99%   BMI 24.75 kg/m  General:   Alert,  pleasant and cooperative in NAD Head:  Normocephalic and atraumatic. Neck:  Supple; Lungs:  Clear throughout to auscultation.    Heart:  Regular rate and rhythm. Abdomen:  Soft, nontender and nondistended. Normal bowel sounds, without guarding, and without rebound.   Neurologic:  Alert and  oriented x4;   grossly normal neurologically.  Impression/Plan:     PERSONAL HISTORY OF POLYPS.  PLAN: 1. TCS TODAY DISCUSSED PROCEDURE, BENEFITS, & RISKS: < 1% chance of medication reaction, bleeding, perforation, or rupture of spleen/liver.

## 2017-05-02 NOTE — Discharge Instructions (Signed)
You have LARGE EXTERNAL hemorrhoids and diverticulosis IN YOUR LEFT COLON. YOU HAD FOUR POLYPS REMOVED.    DRINK WATER TO KEEP YOUR URINE LIGHT YELLOW.  CONTINUE YOUR WEIGHT LOSS EFFORTS. YOUR BODY MASS INDEX IS OVER 30 WHICH MEANS YOU ARE OBESE. OBESITY IS ASSOCIATED WITH AN INCREASE FOR ALL CANCERS, INCLUDING ESOPHAGEAL AND COLON CANCER.  FOLLOW A HIGH FIBER DIET. AVOID ITEMS THAT CAUSE BLOATING. See info below.  YOUR BIOPSY RESULTS WILL BE AVAILABLE IN MY CHART AFTER DEC 15 AND MY OFFICE WILL CONTACT YOU IN 10-14 DAYS WITH YOUR RESULTS.   USE PREPARATION H FOUR TIMES  A DAY IF NEEDED TO RELIEVE RECTAL PAIN/PRESSURE/BLEEDING.\  Next colonoscopy in 3 years.  Colonoscopy Care After Read the instructions outlined below and refer to this sheet in the next week. These discharge instructions provide you with general information on caring for yourself after you leave the hospital. While your treatment has been planned according to the most current medical practices available, unavoidable complications occasionally occur. If you have any problems or questions after discharge, call DR. Skyler Carel, [phone].  ACTIVITY  You may resume your regular activity, but move at a slower pace for the next 24 hours.   Take frequent rest periods for the next 24 hours.   Walking will help get rid of the air and reduce the bloated feeling in your belly (abdomen).   No driving for 24 hours (because of the medicine (anesthesia) used during the test).   You may shower.   Do not sign any important legal documents or operate any machinery for 24 hours (because of the anesthesia used during the test).    NUTRITION  Drink plenty of fluids.   You may resume your normal diet as instructed by your doctor.   Begin with a light meal and progress to your normal diet. Heavy or fried foods are harder to digest and may make you feel sick to your stomach (nauseated).   Avoid alcoholic beverages for 24 hours or as  instructed.    MEDICATIONS  You may resume your normal medications.   WHAT YOU CAN EXPECT TODAY  Some feelings of bloating in the abdomen.   Passage of more gas than usual.   Spotting of blood in your stool or on the toilet paper  .  IF YOU HAD POLYPS REMOVED DURING THE COLONOSCOPY:  Eat a soft diet IF YOU HAVE NAUSEA, BLOATING, ABDOMINAL PAIN, OR VOMITING.    FINDING OUT THE RESULTS OF YOUR TEST Not all test results are available during your visit. DR. Oneida Alar WILL CALL YOU WITHIN 14 DAYS OF YOUR PROCEDUE WITH YOUR RESULTS. Do not assume everything is normal if you have not heard from DR. Janeli Lewison, CALL HER OFFICE AT [phone].  SEEK IMMEDIATE MEDICAL ATTENTION AND CALL THE OFFICE: [phone] IF:  You have more than a spotting of blood in your stool.   Your belly is swollen (abdominal distention).   You are nauseated or vomiting.   You have a temperature over 101F.   You have abdominal pain or discomfort that is severe or gets worse throughout the day.  High-Fiber Diet A high-fiber diet changes your normal diet to include more whole grains, legumes, fruits, and vegetables. Changes in the diet involve replacing refined carbohydrates with unrefined foods. The calorie level of the diet is essentially unchanged. The Dietary Reference Intake (recommended amount) for adult males is 38 grams per day. For adult females, it is 25 grams per day. Pregnant and lactating women should  consume 28 grams of fiber per day. Fiber is the intact part of a plant that is not broken down during digestion. Functional fiber is fiber that has been isolated from the plant to provide a beneficial effect in the body. PURPOSE  Increase stool bulk.   Ease and regulate bowel movements.   Lower cholesterol.   REDUCE RISK OF COLON CANCER  INDICATIONS THAT YOU NEED MORE FIBER  Constipation and hemorrhoids.   Uncomplicated diverticulosis (intestine condition) and irritable bowel syndrome.    Weight management.   As a protective measure against hardening of the arteries (atherosclerosis), diabetes, and cancer.   GUIDELINES FOR INCREASING FIBER IN THE DIET  Start adding fiber to the diet slowly. A gradual increase of about 5 more grams (2 slices of whole-wheat bread, 2 servings of most fruits or vegetables, or 1 bowl of high-fiber cereal) per day is best. Too rapid an increase in fiber may result in constipation, flatulence, and bloating.   Drink enough water and fluids to keep your urine clear or pale yellow. Water, juice, or caffeine-free drinks are recommended. Not drinking enough fluid may cause constipation.   Eat a variety of high-fiber foods rather than one type of fiber.   Try to increase your intake of fiber through using high-fiber foods rather than fiber pills or supplements that contain small amounts of fiber.   The goal is to change the types of food eaten. Do not supplement your present diet with high-fiber foods, but replace foods in your present diet.   INCLUDE A VARIETY OF FIBER SOURCES  Replace refined and processed grains with whole grains, canned fruits with fresh fruits, and incorporate other fiber sources. White rice, white breads, and most bakery goods contain little or no fiber.   Brown whole-grain rice, buckwheat oats, and many fruits and vegetables are all good sources of fiber. These include: broccoli, Brussels sprouts, cabbage, cauliflower, beets, sweet potatoes, white potatoes (skin on), carrots, tomatoes, eggplant, squash, berries, fresh fruits, and dried fruits.   Cereals appear to be the richest source of fiber. Cereal fiber is found in whole grains and bran. Bran is the fiber-rich outer coat of cereal grain, which is largely removed in refining. In whole-grain cereals, the bran remains. In breakfast cereals, the largest amount of fiber is found in those with "bran" in their names. The fiber content is sometimes indicated on the label.   You may  need to include additional fruits and vegetables each day.   In baking, for 1 cup white flour, you may use the following substitutions:   1 cup whole-wheat flour minus 2 tablespoons.   1/2 cup white flour plus 1/2 cup whole-wheat flour.   Polyps, Colon  A polyp is extra tissue that grows inside your body. Colon polyps grow in the large intestine. The large intestine, also called the colon, is part of your digestive system. It is a long, hollow tube at the end of your digestive tract where your body makes and stores stool. Most polyps are not dangerous. They are benign. This means they are not cancerous. But over time, some types of polyps can turn into cancer. Polyps that are smaller than a pea are usually not harmful. But larger polyps could someday become or may already be cancerous. To be safe, doctors remove all polyps and test them.   PREVENTION There is not one sure way to prevent polyps. You might be able to lower your risk of getting them if you:  Eat more fruits  and vegetables and less fatty food.   Do not smoke.   Avoid alcohol.   Exercise every day.   Lose weight if you are overweight.   Eating more calcium and folate can also lower your risk of getting polyps. Some foods that are rich in calcium are milk, cheese, and broccoli. Some foods that are rich in folate are chickpeas, kidney beans, and spinach.    Diverticulosis Diverticulosis is a common condition that develops when small pouches (diverticula) form in the wall of the colon. The risk of diverticulosis increases with age. It happens more often in people who eat a low-fiber diet. Most individuals with diverticulosis have no symptoms. Those individuals with symptoms usually experience belly (abdominal) pain, constipation, or loose stools (diarrhea).  HOME CARE INSTRUCTIONS  Increase the amount of fiber in your diet as directed by your caregiver or dietician. This may reduce symptoms of diverticulosis.   Drink at  least 6 to 8 glasses of water each day to prevent constipation.   Try not to strain when you have a bowel movement.   Avoiding nuts and seeds to prevent complications is NOT NECESSARY.   FOODS HAVING HIGH FIBER CONTENT INCLUDE:  Fruits. Apple, peach, pear, tangerine, raisins, prunes.   Vegetables. Brussels sprouts, asparagus, broccoli, cabbage, carrot, cauliflower, romaine lettuce, spinach, summer squash, tomato, winter squash, zucchini.   Starchy Vegetables. Baked beans, kidney beans, lima beans, split peas, lentils, potatoes (with skin).   Grains. Whole wheat bread, brown rice, bran flake cereal, plain oatmeal, white rice, shredded wheat, bran muffins.   SEEK IMMEDIATE MEDICAL CARE IF:  You develop increasing pain or severe bloating.   You have an oral temperature above 101F.   You develop vomiting or bowel movements that are bloody or black.   Hemorrhoids Hemorrhoids are dilated (enlarged) veins around the rectum. Sometimes clots will form in the veins. This makes them swollen and painful. These are called thrombosed hemorrhoids. Causes of hemorrhoids include:  Constipation.   Straining to have a bowel movement.   HEAVY LIFTING   HOME CARE INSTRUCTIONS  Eat a well balanced diet and drink 6 to 8 glasses of water every day to avoid constipation. You may also use a bulk laxative.   Avoid straining to have bowel movements.   Keep anal area dry and clean.   Do not use a donut shaped pillow or sit on the toilet for long periods. This increases blood pooling and pain.   Move your bowels when your body has the urge; this will require less straining and will decrease pain and pressure.

## 2017-05-02 NOTE — Op Note (Signed)
[facility] Patient Name: Steven Glover Procedure Date: 05/02/2017 10:49 AM MRN: [id number] Date of Birth: 09/25/1950 Attending MD: Barney Drain MD, MD CSN: [id number] Age: 66 Admit Type: Outpatient Procedure:                Colonoscopy WITH SNARE CAUTERY POLYPECTOMY Indications:              Personal history of colonic polyps Providers:                Barney Drain MD, MD, Jeralyn Bennett RN, RN, Lurline Del, RN Referring MD:             Elayne Snare. [place] Medicines:                Promethazine 12.5 mg IV, Meperidine 75 mg IV,                            Midazolam 5 mg IV Complications:            No immediate complications. Estimated Blood Loss:     Estimated blood loss was minimal. Procedure:                Pre-Anesthesia Assessment:                           - Prior to the procedure, a History and Physical                            was performed, and patient medications and                            allergies were reviewed. The patient's tolerance of                            previous anesthesia was also reviewed. The risks                            and benefits of the procedure and the sedation                            options and risks were discussed with the patient.                            All questions were answered, and informed consent                            was obtained. Prior Anticoagulants: The patient has                            taken no previous anticoagulant or antiplatelet                            agents. ASA Grade Assessment: II - A patient with  mild systemic disease. After reviewing the risks                            and benefits, the patient was deemed in                            satisfactory condition to undergo the procedure.                            After obtaining informed consent, the colonoscope                            was passed under direct vision. Throughout the                    procedure, the patient's blood pressure, pulse, and                            oxygen saturations were monitored continuously. The                            EC-[id number] ([id number]) scope was introduced through                            the anus and advanced to the the cecum, identified                            by appendiceal orifice and ileocecal valve. The                            colonoscopy was somewhat difficult due to a                            tortuous colon. Successful completion of the                            procedure was aided by straightening and shortening                            the scope to obtain bowel loop reduction and                            COLOWRAP. The ileocecal valve, appendiceal orifice,                            and rectum were photographed. The quality of the                            bowel preparation was good. Scope In: 11:14:09 AM Scope Out: 11:34:58 AM Scope Withdrawal Time: 0 hours 17 minutes 45 seconds  Total Procedure Duration: 0 hours 20 minutes 49 seconds  Findings:      Three sessile polyps were found in the descending colon, transverse       colon and cecum. The polyps were 3 to 5 mm in size(BTL  1). These polyps       were removed with a hot snare. Resection and retrieval were complete.      A 6 mm polyp was found in the rectum. The polyp was sessile. The polyp       was removed with a hot snare(BTL 2). Resection and retrieval were       complete.      Multiple small and large-mouthed diverticula were found in the       recto-sigmoid colon and sigmoid colon.      External hemorrhoids were found during retroflexion. The hemorrhoids       were large. Impression:               - Three 3 to 5 mm polyps in the descending colon,                            in the transverse colon and in the cecum, removed                            with a hot snare. Resected and retrieved.                           - One 6 mm polyp in the  rectum, removed with a hot                            snare. Resected and retrieved.                           - Diverticulosis in the recto-sigmoid colon and in                            the sigmoid colon.                           - External hemorrhoids. Moderate Sedation:      Moderate (conscious) sedation was administered by the endoscopy nurse       and supervised by the endoscopist. The following parameters were       monitored: oxygen saturation, heart rate, blood pressure, and response       to care. Total physician intraservice time was 38 minutes. Recommendation:           - Repeat colonoscopy in 3 years for surveillance.                           - High fiber diet.                           - Continue present medications.                           - Await pathology results.                           - Patient has a contact number available for  emergencies. The signs and symptoms of potential                            delayed complications were discussed with the                            patient. Return to normal activities tomorrow.                            Written discharge instructions were provided to the                            patient. Procedure Code(s):        --- Professional ---                           [id number], Colonoscopy, flexible; with removal of                            tumor(s), polyp(s), or other lesion(s) by snare                            technique                           99152, Moderate sedation services provided by the                            same physician or other qualified health care                            professional performing the diagnostic or                            therapeutic service that the sedation supports,                            requiring the presence of an independent trained                            observer to assist in the monitoring of the                            patient's level  of consciousness and physiological                            status; initial 15 minutes of intraservice time,                            patient age 59 years or older                           [phone], Moderate sedation services; each additional  15 minutes intraservice time                           99153, Moderate sedation services; each additional                            15 minutes intraservice time Diagnosis Code(s):        --- Professional ---                           D12.4, Benign neoplasm of descending colon                           D12.3, Benign neoplasm of transverse colon (hepatic                            flexure or splenic flexure)                           D12.0, Benign neoplasm of cecum                           K62.1, Rectal polyp                           K64.4, Residual hemorrhoidal skin tags                           Z86.010, Personal history of colonic polyps                           K57.30, Diverticulosis of large intestine without                            perforation or abscess without bleeding CPT copyright 2016 [employer]. All rights reserved. The codes documented in this report are preliminary and upon coder review may  be revised to meet current compliance requirements. Barney Drain, MD Barney Drain MD, MD 05/02/2017 11:47:52 AM This report has been signed electronically. Number of Addenda: 0

## 2017-05-04 ENCOUNTER — Encounter (HOSPITAL_COMMUNITY): Payer: Self-pay | Admitting: Gastroenterology

## 2017-05-10 NOTE — Progress Notes (Signed)
PT is aware.

## 2017-05-10 NOTE — Progress Notes (Signed)
ON RECALL  °

## 2017-05-10 NOTE — Progress Notes (Signed)
LMOM to call.

## 2017-05-25 ENCOUNTER — Encounter: Payer: Self-pay | Admitting: Gastroenterology

## 2017-07-20 ENCOUNTER — Ambulatory Visit: Payer: Managed Care, Other (non HMO) | Admitting: Gastroenterology

## 2017-07-20 ENCOUNTER — Encounter: Payer: Self-pay | Admitting: Gastroenterology

## 2017-07-20 DIAGNOSIS — K219 Gastro-esophageal reflux disease without esophagitis: Secondary | ICD-10-CM | POA: Diagnosis not present

## 2017-07-20 DIAGNOSIS — R1319 Other dysphagia: Secondary | ICD-10-CM

## 2017-07-20 DIAGNOSIS — Z1211 Encounter for screening for malignant neoplasm of colon: Secondary | ICD-10-CM

## 2017-07-20 DIAGNOSIS — R1310 Dysphagia, unspecified: Secondary | ICD-10-CM | POA: Diagnosis not present

## 2017-07-20 NOTE — Progress Notes (Signed)
CC'ED TO PCP 

## 2017-07-20 NOTE — Progress Notes (Signed)
ON RECALL  °

## 2017-07-20 NOTE — Assessment & Plan Note (Signed)
SYMPTOMS CONTROLLED/RESOLVED.  TAKE OMEPRAZOLE ONCE DAILY 30 MINS PRIOR TO BREAKFAST. FOLLOW UP IN 1 YEAR.

## 2017-07-20 NOTE — Assessment & Plan Note (Signed)
FOUR SIMPLE ADENOMAS REMOVED DEC 2018. REVIEWED TCS REPORT.  NEXT TCS IN 2021.

## 2017-07-20 NOTE — Assessment & Plan Note (Signed)
SYMPTOMS CONTROLLED/RESOLVED.  CONTINUE TO MONITOR SYMPTOMS. 

## 2017-07-20 NOTE — Progress Notes (Signed)
Subjective:    Patient ID: Steven Glover, male    DOB: 1951-04-07, 67 y.o.   MRN: [id number] Kathyrn Drown, MD   HPI HEARTBURN CONTROLLED. SWALLOWING IS GOOD.   PT DENIES FEVER, CHILLS, HEMATOCHEZIA, HEMATEMESIS, nausea, vomiting, melena, diarrhea, CHEST PAIN, SHORTNESS OF BREATH,  CHANGE IN BOWEL IN HABITS, constipation, abdominal pain, problems swallowing, problems with sedation, heartburn or indigestion.  Past Medical History:  Diagnosis Date  . Acid reflux disease   . Allergy   . Asthma    as child  . BMI between 19-24,adult JUL 2012 191.8 LBS  . Collagen vascular disease ([place])   . GERD (gastroesophageal reflux disease)   . Internal hemorrhoids with other complication    TCS DEC 6213  . Pulmonary nodule 2013   [facility] - Repeat 08/2012  . Rheumatoid arthritis(714.0)     Past Surgical History:  Procedure Laterality Date  . COLONOSCOPY  2009 NUR [place]   MULTIPLE SIMPLE ADENOMAS  . COLONOSCOPY N/A 05/02/2017   Procedure: COLONOSCOPY;  Surgeon: Danie Binder, MD;  Location: [facility];  Service: Endoscopy;  Laterality: N/A;  10:30 AM  . COLONOSCOPY W/ POLYPECTOMY  2010 NUR  . COLONOSCOPY WITH PROPOFOL  05/15/2012   SLF:Two sessile polyps ranging between 3-70mm in size were found in the ascending colon and rectum; multiple biopsies were performed/Mild diverticulosis was noted in the sigmoid colon/The colon mucosa was otherwise normal/ Moderate sized internal hemorrhoids. TCS 04/2017.  Marland Kitchen ESOPHAGOGASTRODUODENOSCOPY  01/01/2011 YQ:[facility] CHRONIC GASTRITIS   SLF: Hiatal Hernia/mild gastritis/stricture in the distal esophagus  . POLYPECTOMY  05/15/2012   SIMPLE ADENOMA(2)  . SAVORY DILATION  01/01/2011   Procedure: SAVORY DILATION;  Surgeon: Dorothyann Peng, MD;  Location: [facility];  Service: Endoscopy;  Laterality: N/A;  . SHOULDER OPEN ROTATOR CUFF REPAIR  2005  . WRIST FUSION     Allergies  Allergen Reactions  . Cefprozil     Nausea, felt bad   Current Outpatient  Medications  Medication Sig Dispense Refill  . etanercept (ENBREL) 50 MG/ML injection Inject 50 mg into the skin every Monday.     . fexofenadine (ALLERGY RELIEF) 180 MG tablet Take 180 mg by mouth daily as needed for allergies.    . folic acid (FOLVITE) 1 MG tablet Take 1 mg by mouth daily.      . methotrexate 2.5 MG tablet Take 20 mg by mouth every Monday. 8 tablets-Monday    . omeprazole (PRILOSEC) 20 MG capsule Take 20 mg by mouth daily.     Review of Systems PER HPI OTHERWISE ALL SYSTEMS ARE NEGATIVE.    Objective:   Physical Exam  Constitutional: He is oriented to person, place, and time. He appears well-developed and well-nourished. No distress.  HENT:  Head: Normocephalic and atraumatic.  Mouth/Throat: Oropharynx is clear and moist. No oropharyngeal exudate.  Eyes: Pupils are equal, round, and reactive to light. No scleral icterus.  Neck: Normal range of motion. Neck supple.  Cardiovascular: Normal rate, regular rhythm and normal heart sounds.  Pulmonary/Chest: Effort normal and breath sounds normal. No respiratory distress.  Abdominal: Soft. Bowel sounds are normal. He exhibits no distension. There is no tenderness.  Musculoskeletal: He exhibits no edema.  Lymphadenopathy:    He has no cervical adenopathy.  Neurological: He is alert and oriented to person, place, and time.  NO  NEW FOCAL DEFICITS  Psychiatric: He has a normal mood and affect.  Vitals reviewed.     Assessment & Plan:

## 2017-07-20 NOTE — Patient Instructions (Addendum)
DRINK WATER TO KEEP YOUR URINE LIGHT YELLOW.  FOLLOW A HIGH FIBER DIET. AVOID ITEMS THAT CAUSE BLOATING & GAS.   CONTINUE OMEPRAZOLE.  TAKE 30 MINUTES PRIOR TO YOUR FIRST MEAL DAILY.  FOLLOW UP IN 1 YEAR.

## 2018-01-16 ENCOUNTER — Encounter: Payer: Self-pay | Admitting: Podiatry

## 2018-01-16 ENCOUNTER — Ambulatory Visit: Payer: Managed Care, Other (non HMO) | Admitting: Podiatry

## 2018-01-16 VITALS — BP 135/76 | HR 61 | Ht 75.0 in | Wt 200.0 lb

## 2018-01-16 DIAGNOSIS — M21962 Unspecified acquired deformity of left lower leg: Secondary | ICD-10-CM | POA: Diagnosis not present

## 2018-01-16 DIAGNOSIS — M79675 Pain in left toe(s): Secondary | ICD-10-CM | POA: Diagnosis not present

## 2018-01-16 DIAGNOSIS — L97521 Non-pressure chronic ulcer of other part of left foot limited to breakdown of skin: Secondary | ICD-10-CM | POA: Diagnosis not present

## 2018-01-16 NOTE — Patient Instructions (Signed)
Seen for painful lesion left foot under the 2nd MPJ area, pre ulcerative. All lesions debrided. Aperture pad placed on shoe liner left foot. May benefit from custom orthotics. Return in one month.

## 2018-01-16 NOTE — Progress Notes (Addendum)
SUBJECTIVE: 67 y.o. year old male presents with left foot pain. Has had foot surgery in 2017, HAV correction and Metatarsal osteotomy with plantar callus removal. Patient works full time and on feet all day wearing steel toed shoes.  Review of Systems  Constitutional: Negative.   HENT: Negative.   Eyes: Negative.   Respiratory: Negative.   Cardiovascular: Negative.   Gastrointestinal: Positive for heartburn.  Genitourinary: Negative.   Musculoskeletal: Positive for joint pain.       Rheumatoid arthritis under medical management.  Skin: Negative.      OBJECTIVE: DERMATOLOGIC EXAMINATION: Hyperpigmented dorsum of left forefoot from previous surgery. Ulcerating callus under 2nd MPJ left foot with intradermal bleeding. No drainage noted. No associated cellulitis noted.  VASCULAR EXAMINATION OF LOWER LIMBS: All pedal pulses are palpable with normal pulsation.  Capillary Filling times within 3 seconds in all digits.  No edema or erythema noted. Temperature gradient from tibial crest to dorsum of foot is within normal bilateral.  NEUROLOGIC EXAMINATION OF THE LOWER LIMBS: All epicritic and tactile sensations grossly intact. Sharp and Dull discriminatory sensations at the plantar ball of hallux is intact bilateral.   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 Elevated first ray left. Plantar flexed 2nd MPJ left.  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 Ulcerating callus under 2nd MPJ left, limited to breakdown of skin.. Pain with ambulation.  PLAN: Left foot lesion debrided and Amerigel ointment with aperture pad placed. 1/4 Felt pad placed under shoe liner to help off loading of the left foot lesion.  Return in one month.

## 2018-02-09 ENCOUNTER — Ambulatory Visit (INDEPENDENT_AMBULATORY_CARE_PROVIDER_SITE_OTHER): Payer: Managed Care, Other (non HMO) | Admitting: *Deleted

## 2018-02-09 DIAGNOSIS — Z23 Encounter for immunization: Secondary | ICD-10-CM

## 2018-02-16 ENCOUNTER — Encounter: Payer: Self-pay | Admitting: Podiatry

## 2018-02-16 ENCOUNTER — Ambulatory Visit: Payer: Managed Care, Other (non HMO) | Admitting: Podiatry

## 2018-02-16 DIAGNOSIS — L97521 Non-pressure chronic ulcer of other part of left foot limited to breakdown of skin: Secondary | ICD-10-CM | POA: Diagnosis not present

## 2018-02-16 DIAGNOSIS — M79675 Pain in left toe(s): Secondary | ICD-10-CM

## 2018-02-16 DIAGNOSIS — M21962 Unspecified acquired deformity of left lower leg: Secondary | ICD-10-CM | POA: Diagnosis not present

## 2018-02-16 NOTE — Patient Instructions (Signed)
Seen for pain under 2nd MPJ left foot.  Debrided build up callus. Aperture pad placed on shoe liner x 2. May return as needed.

## 2018-02-16 NOTE — Progress Notes (Signed)
SUBJECTIVE: 67 y.o. year old male presents for one month follow up on left foot pain. Stated that the last treatment helped him for about 2 weeks.   HPI: Left foot surgery in 2017, HAV correction and Metatarsal osteotomy with plantar callus removal. Patient works full time and on feet all day wearing steel toed shoes.  OBJECTIVE: DERMATOLOGIC EXAMINATION: Hyperpigmented dorsum of left forefoot from previous surgery. Improved callus under 2nd MPJ left foot with intradermal bleeding. No drainage noted. No associated cellulitis noted.  VASCULAR EXAMINATION OF LOWER LIMBS: All pedal pulses are palpable with normal pulsation.   NEUROLOGIC EXAMINATION OF THE LOWER LIMBS: All epicritic and tactile sensations grossly intact. Sharp and Dull discriminatory sensations at the plantar ball of hallux is intact bilateral.   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 Elevated first ray left. Plantar flexed 2nd MPJ left. Ulcerating plantar callus sub 2 left foot.  ASSESSMENT: Improved ulcerating callus left foot under 2nd MPJ, limited to breakdown of skin. 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 Pain with prolonged weight bearing and ambulation.  PLAN: Reviewed findings and available treatment options. Left foot lesion debrided. Double layer of 1/4" aperture pad placed on shoe liner of regular shoe and steel toed shoe for work. May return as needed.

## 2018-05-04 ENCOUNTER — Encounter: Payer: Self-pay | Admitting: Family Medicine

## 2018-05-04 ENCOUNTER — Ambulatory Visit: Payer: Managed Care, Other (non HMO) | Admitting: Family Medicine

## 2018-05-04 VITALS — BP 122/80 | Temp 97.7°F | Ht 72.75 in | Wt 213.0 lb

## 2018-05-04 DIAGNOSIS — Z125 Encounter for screening for malignant neoplasm of prostate: Secondary | ICD-10-CM | POA: Diagnosis not present

## 2018-05-04 DIAGNOSIS — J019 Acute sinusitis, unspecified: Secondary | ICD-10-CM | POA: Diagnosis not present

## 2018-05-04 DIAGNOSIS — E782 Mixed hyperlipidemia: Secondary | ICD-10-CM | POA: Diagnosis not present

## 2018-05-04 DIAGNOSIS — B9689 Other specified bacterial agents as the cause of diseases classified elsewhere: Secondary | ICD-10-CM

## 2018-05-04 MED ORDER — FLUTICASONE PROPIONATE 50 MCG/ACT NA SUSP: 2.0000 | Freq: Every day | NASAL | 5 refills | Status: DC

## 2018-05-04 MED ORDER — AMOXICILLIN 500 MG PO TABS: 500.0000 mg | ORAL_TABLET | Freq: Three times a day (TID) | ORAL | 0 refills | Status: DC

## 2018-05-04 NOTE — Progress Notes (Signed)
   Subjective:    Patient ID: Steven Glover, male    DOB: 1951-03-04, 67 y.o.   MRN: [id number]  HPI Patient is here today with complaints of a runny nose and headache, sinus pressure on going for the last 2-3 weeks. He has not taken any medications for this. 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  HENT: Positive for congestion and rhinorrhea. Negative for ear pain.   Eyes: Negative for discharge.  Respiratory: Positive for cough. Negative for wheezing.   Cardiovascular: Negative for chest pain.  Gastrointestinal: Negative for nausea and vomiting.  Musculoskeletal: Negative for arthralgias.       Objective:   Physical Exam Vitals signs and nursing note reviewed.  Constitutional:      Appearance: He is well-developed.  HENT:     Head: Normocephalic.     Mouth/Throat:     Pharynx: No oropharyngeal exudate.  Neck:     Musculoskeletal: Normal range of motion.  Cardiovascular:     Rate and Rhythm: Normal rate and regular rhythm.     Heart sounds: Normal heart sounds. No murmur.  Pulmonary:     Effort: Pulmonary effort is normal.     Breath sounds: Normal breath sounds. No wheezing.  Lymphadenopathy:     Cervical: No cervical adenopathy.  Skin:    General: Skin is warm and dry.  Neurological:     Motor: No abnormal muscle tone.           Assessment & Plan:  Wellness exam recommended early part of next year Scleral hemorrhage should gradually clear itself Acute rhinosinusitis antibiotic prescribed warning signs discussed follow-up if problems has taken amoxicillin before Possible allergy related  issues use Flonase along with fexofenadine for the next couple weeks

## 2018-05-09 ENCOUNTER — Other Ambulatory Visit: Payer: Self-pay | Admitting: Family Medicine

## 2018-05-10 LAB — LIPID PANEL
Chol/HDL Ratio: 6.4 ratio — ABNORMAL HIGH (ref 0.0–5.0)
Cholesterol, Total: 179 mg/dL (ref 100–199)
HDL: 28 mg/dL — AB (ref >39)
LDL Calculated: 95 mg/dL (ref 0–99)
Triglycerides: 279 mg/dL — ABNORMAL HIGH (ref 0–149)
VLDL Cholesterol Cal: 56 mg/dL — ABNORMAL HIGH (ref 5–40)

## 2018-05-10 LAB — PSA: PROSTATE SPECIFIC AG, SERUM: 1.9 ng/mL (ref 0.0–4.0)

## 2018-05-15 ENCOUNTER — Other Ambulatory Visit: Payer: Self-pay | Admitting: *Deleted

## 2018-05-15 DIAGNOSIS — E785 Hyperlipidemia, unspecified: Secondary | ICD-10-CM

## 2018-05-15 DIAGNOSIS — Z79899 Other long term (current) drug therapy: Secondary | ICD-10-CM

## 2018-05-15 MED ORDER — ATORVASTATIN CALCIUM 10 MG PO TABS: ORAL_TABLET | ORAL | 5 refills | Status: DC

## 2018-05-16 ENCOUNTER — Encounter: Payer: Self-pay | Admitting: Gastroenterology

## 2018-06-01 ENCOUNTER — Encounter: Payer: Managed Care, Other (non HMO) | Admitting: Family Medicine

## 2018-06-06 ENCOUNTER — Encounter: Payer: Self-pay | Admitting: Family Medicine

## 2018-06-06 ENCOUNTER — Ambulatory Visit (INDEPENDENT_AMBULATORY_CARE_PROVIDER_SITE_OTHER): Payer: Managed Care, Other (non HMO) | Admitting: Family Medicine

## 2018-06-06 VITALS — BP 120/82 | Ht 72.75 in | Wt 213.2 lb

## 2018-06-06 DIAGNOSIS — Z0000 Encounter for general adult medical examination without abnormal findings: Secondary | ICD-10-CM | POA: Diagnosis not present

## 2018-06-06 MED ORDER — ZOSTER VAC RECOMB ADJUVANTED 50 MCG/0.5ML IM SUSR: 0.5000 mL | Freq: Once | INTRAMUSCULAR | 1 refills | Status: AC

## 2018-06-06 NOTE — Progress Notes (Signed)
   Subjective:    Patient ID: Steven Glover, male    DOB: 1950-09-27, 68 y.o.   MRN: [id number]  HPI The patient comes in today for a wellness visit.    A review of their health history was completed.  A review of medications was also completed.  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  Patient denies depression Additional concerns: none Patient denies any falls injuries.   Review of Systems  Constitutional: Negative for activity change, appetite change and fever.  HENT: Negative for congestion and rhinorrhea.   Eyes: Negative for discharge.  Respiratory: Negative for cough and wheezing.   Cardiovascular: Negative for chest pain.  Gastrointestinal: Negative for abdominal pain, blood in stool and vomiting.  Genitourinary: Negative for difficulty urinating and frequency.  Musculoskeletal: Negative for neck pain.  Skin: Negative for rash.  Allergic/Immunologic: Negative for environmental allergies and food allergies.  Neurological: Negative for weakness and headaches.  Psychiatric/Behavioral: Negative for agitation.       Objective:   Physical Exam Constitutional:      Appearance: He is well-developed.  HENT:     Head: Normocephalic and atraumatic.     Right Ear: External ear normal.     Left Ear: External ear normal.     Nose: Nose normal.  Eyes:     Pupils: Pupils are equal, round, and reactive to light.  Neck:     Musculoskeletal: Normal range of motion and neck supple.     Thyroid: No thyromegaly.  Cardiovascular:     Rate and Rhythm: Normal rate and regular rhythm.     Heart sounds: Normal heart sounds. No murmur.  Pulmonary:     Effort: Pulmonary effort is normal. No respiratory distress.     Breath  sounds: Normal breath sounds. No wheezing.  Abdominal:     General: Bowel sounds are normal. There is no distension.     Palpations: Abdomen is soft. There is no mass.     Tenderness: There is no abdominal tenderness.  Genitourinary:    Penis: Normal.   Musculoskeletal: Normal range of motion.  Lymphadenopathy:     Cervical: No cervical adenopathy.  Skin:    General: Skin is warm and dry.     Findings: No erythema.  Neurological:     Mental Status: He is alert.     Motor: No abnormal muscle tone.  Psychiatric:        Behavior: Behavior normal.        Judgment: Judgment normal.    We did discuss his hyperlipidemia is taken his medication he will check his lipid profile in several weeks we just recently started this see previous note his risk for heart disease was elevated       Assessment & Plan:  Adult wellness-complete.wellness physical was conducted today. Importance of diet and exercise were discussed in detail.  In addition to this a discussion regarding safety was also covered. We also reviewed over immunizations and gave recommendations regarding current immunization needed for age.  In addition to this additional areas were also touched on including: Preventative health exams needed:  Colonoscopy he is due in 2023  Patient was advised yearly wellness exam I do recommend patient do a Shingrix vaccine this spring

## 2018-06-07 ENCOUNTER — Encounter: Payer: Managed Care, Other (non HMO) | Admitting: Family Medicine

## 2018-07-25 ENCOUNTER — Telehealth: Payer: Self-pay | Admitting: Family Medicine

## 2018-07-25 NOTE — Telephone Encounter (Signed)
Increase discomforts is not unusual with statins.  But when the discomfort is difficult to bear it is recommended to stop the medicine.  If the medicine is causing the discomfort then the discomfort should get much better when stopping the medicine.  It is still important to remember that we would like to see cholesterol improved to lessen the risk of strokes and heart attack.  So typically with this what I would recommend doing is taking a break from the medicine over the next 2 to 3 weeks and see does the pain go away? Then the patient can send [place] an update through my chart or if unable to do so can call us to let us know the pain did go away and at that point I would recommend the patient be willing to try a different cholesterol medicine but this time we would just have him take it a couple days a week to see if he can tolerate  Our whole goal is to enter prove his numbers to lessen the risk of stroke and heart attack as he gets older

## 2018-07-25 NOTE — Telephone Encounter (Signed)
Patient states ever since starting atorvastatin (LIPITOR) 10 MG tablet he has been experiencing joint pain. Advise.

## 2018-07-26 NOTE — Telephone Encounter (Signed)
Patient advised per Dr Nicki Reaper:  Increase discomforts is not unusual with statins.  But when the discomfort is difficult to bear it is recommended to stop the medicine.  If the medicine is causing the discomfort then the discomfort should get much better when stopping the medicine.  It is still important to remember that we would like to see cholesterol improved to lessen the risk of strokes and heart attack.  So typically with this what Dr Nicki Reaper would recommend doing is taking a break from the medicine over the next 2 to 3 weeks and see does the pain go away? Then the patient can send [place] an update through my chart or if unable to do so can call us to let us know the pain did go away and at that point Dr Nicki Reaper would recommend the patient be willing to try a different cholesterol medicine but this time we would just have him take it a couple days a week to see if he can tolerate  Our whole goal is to enter prove his numbers to lessen the risk of stroke and heart attack as he gets older  Patient verbalized understanding

## 2018-07-26 NOTE — Telephone Encounter (Signed)
Left message to return call 

## 2018-08-04 ENCOUNTER — Ambulatory Visit: Payer: Managed Care, Other (non HMO) | Admitting: Gastroenterology

## 2018-08-08 ENCOUNTER — Other Ambulatory Visit: Payer: Self-pay

## 2018-08-08 ENCOUNTER — Encounter: Payer: Self-pay | Admitting: Gastroenterology

## 2018-08-08 ENCOUNTER — Ambulatory Visit: Payer: Managed Care, Other (non HMO) | Admitting: Gastroenterology

## 2018-08-08 VITALS — BP 142/78 | HR 81 | Temp 97.2°F | Ht 75.0 in | Wt 210.6 lb

## 2018-08-08 DIAGNOSIS — Z86010 Personal history of colonic polyps: Secondary | ICD-10-CM

## 2018-08-08 DIAGNOSIS — K219 Gastro-esophageal reflux disease without esophagitis: Secondary | ICD-10-CM | POA: Diagnosis not present

## 2018-08-08 MED ORDER — OMEPRAZOLE 20 MG PO CPDR: 20.0000 mg | DELAYED_RELEASE_CAPSULE | Freq: Every day | ORAL | 3 refills | Status: DC

## 2018-08-08 NOTE — Assessment & Plan Note (Signed)
Doing well.  Continue omeprazole 20 mg daily before breakfast.  Rx sent to pharmacy as it appears to be covered now.  Return to the office in Sept 2021.

## 2018-08-08 NOTE — Progress Notes (Signed)
CC'ED TO PCP 

## 2018-08-08 NOTE — Patient Instructions (Signed)
Continue omeprazole once daily before breakfast.   Return to the office in 01/2020. We will go ahead and schedule your colonoscopy at that time.

## 2018-08-08 NOTE — Progress Notes (Signed)
      Primary Care Physician: Kathyrn Drown, MD  Primary Gastroenterologist:  Barney Drain, MD   Chief Complaint  Patient presents with  . Dysphagia  . Gastroesophageal Reflux    HPI: Steven Glover is a 68 y.o. male here for one-year follow-up.  He has a history of GERD, dysphagia.  Remote EGD in 2012 with hiatal hernia, mild gastritis, stricture in the distal esophagus.  History of 4 simple adenomas removed from colon in 2018.  Due for colonoscopy in 2021.  Clinically doing well.  Reflux well controlled.  Watches his diet.  Bowel movements regular.  No blood in the stool or melena.  No abdominal pain.  Takes omeprazole 20 mg daily.  Buys over-the-counter for the past several years because insurance stopped paying for it.     Current Outpatient Medications  Medication Sig Dispense Refill  . etanercept (ENBREL) 50 MG/ML injection Inject 50 mg into the skin every Monday.     . fexofenadine (ALLERGY RELIEF) 180 MG tablet Take 180 mg by mouth daily as needed for allergies.    . fluticasone (FLONASE) 50 MCG/ACT nasal spray Place 2 sprays into both nostrils daily. (Patient taking differently: Place 2 sprays into both nostrils as needed. ) 16 g 5  . folic acid (FOLVITE) 1 MG tablet Take 1 mg by mouth daily.      . methotrexate 2.5 MG tablet Take 20 mg by mouth every Monday. 7 tablets-Monday    . omeprazole (PRILOSEC) 20 MG capsule Take 20 mg by mouth daily.     No current facility-administered medications for this visit.     Allergies as of 08/08/2018 - Review Complete 08/08/2018  Allergen Reaction Noted  . Cefprozil  02/21/2015    ROS:  General: Negative for anorexia, weight loss, fever, chills, fatigue, weakness. ENT: Negative for hoarseness, difficulty swallowing , nasal congestion. CV: Negative for chest pain, angina, palpitations, dyspnea on exertion, peripheral edema.  Respiratory: Negative for dyspnea at rest, dyspnea on exertion, cough, sputum, wheezing.  GI: See  history of present illness. GU:  Negative for dysuria, hematuria, urinary incontinence, urinary frequency, nocturnal urination.  Endo: Negative for unusual weight change.    Physical Examination:   BP (!) 142/78   Pulse 81   Temp (!) 97.2 F (36.2 C) (Oral)   Ht 6\' 3"  (1.905 m)   Wt 210 lb 9.6 oz (95.5 kg)   BMI 26.32 kg/m   General: Well-nourished, well-developed in no acute distress.  Eyes: No icterus. Mouth: Oropharyngeal mucosa moist and pink , no lesions erythema or exudate. Lungs: Clear to auscultation bilaterally.  Heart: Regular rate and rhythm, no murmurs rubs or gallops.  Abdomen: Bowel sounds are normal, nontender, nondistended, no hepatosplenomegaly or masses, no abdominal bruits or hernia , no rebound or guarding.   Extremities: No lower extremity edema. No clubbing or deformities. Neuro: Alert and oriented x 4   Skin: Warm and dry, no jaundice.   Psych: Alert and cooperative, normal mood and affect.

## 2018-08-08 NOTE — Assessment & Plan Note (Signed)
Due colonoscopy in December 2021.  Will have patient come back around September of next year to schedule.

## 2018-11-30 ENCOUNTER — Other Ambulatory Visit: Payer: Self-pay

## 2018-11-30 ENCOUNTER — Ambulatory Visit (INDEPENDENT_AMBULATORY_CARE_PROVIDER_SITE_OTHER): Payer: Managed Care, Other (non HMO) | Admitting: Family Medicine

## 2018-11-30 DIAGNOSIS — E785 Hyperlipidemia, unspecified: Secondary | ICD-10-CM | POA: Diagnosis not present

## 2018-11-30 NOTE — Progress Notes (Signed)
   Subjective:  Telephone video not available  Patient ID: Steven Glover, male    DOB: Dec 27, 1950, 68 y.o.   MRN: [id number]  HPI Pt here for 6 month follow up. Pt states he is doing well. No questions or concerns at this time. This patient does have cholesterol issues.  He relates that he does best job he can watching his diet  He states that when he was taking statins it caused severe muscle aches and pains and felt bad that he states after he stopped taking it took a month to get back to normal he states he is not interested in going back on medicine currently  Patient's rheumatoid as well as GI is going well and he sees specialist for this  Patient does try to be safe minimizing risk of corona patient was cautioned that he is at high risk of severe disease and he should do the best he can wearing mask keeping social distance and washing hands Virtual Visit via Video Note  I connected with Steven Glover on 11/30/18 at  8:30 AM EDT by a video enabled telemedicine application and verified that I am speaking with the correct person using two identifiers.  Location: Patient: home Provider: office   I discussed the limitations of evaluation and management by telemedicine and the availability of in person appointments. The patient expressed understanding and agreed to proceed.  History of Present Illness:    Observations/Objective:   Assessment and Plan:   Follow Up Instructions:    I discussed the assessment and treatment plan with the patient. The patient was provided an opportunity to ask questions and all were answered. The patient agreed with the plan and demonstrated an understanding of the instructions.   The patient was advised to call back or seek an in-person evaluation if the symptoms worsen or if the condition fails to improve as anticipated.  I provided 15 minutes of non-face-to-face time during this encounter.   Vicente Males, LPN    Review of  Systems  Constitutional: Negative for diaphoresis and fatigue.  HENT: Negative for congestion and rhinorrhea.   Respiratory: Negative for cough and shortness of breath.   Cardiovascular: Negative for chest pain and leg swelling.  Gastrointestinal: Negative for abdominal pain and diarrhea.  Skin: Negative for color change and rash.  Neurological: Negative for dizziness and headaches.  Psychiatric/Behavioral: Negative for behavioral problems and confusion.       Objective:   Physical Exam Today's visit was via telephone Physical exam was not possible for this visit        Assessment & Plan:  Hyperlipidemia patient will do a good job watching diet staying physically active patient was encouraged to follow-up if progressive troubles or if worse otherwise in December we will do a wellness along with lab work once again patient not interested in statins

## 2019-02-24 ENCOUNTER — Other Ambulatory Visit (INDEPENDENT_AMBULATORY_CARE_PROVIDER_SITE_OTHER): Payer: Managed Care, Other (non HMO) | Admitting: *Deleted

## 2019-02-24 DIAGNOSIS — Z23 Encounter for immunization: Secondary | ICD-10-CM | POA: Diagnosis not present

## 2019-04-24 ENCOUNTER — Other Ambulatory Visit: Payer: Self-pay

## 2019-04-24 ENCOUNTER — Encounter: Payer: Self-pay | Admitting: Family Medicine

## 2019-04-24 ENCOUNTER — Ambulatory Visit (INDEPENDENT_AMBULATORY_CARE_PROVIDER_SITE_OTHER): Payer: Managed Care, Other (non HMO) | Admitting: Family Medicine

## 2019-04-24 DIAGNOSIS — J019 Acute sinusitis, unspecified: Secondary | ICD-10-CM | POA: Diagnosis not present

## 2019-04-24 DIAGNOSIS — Z20822 Contact with and (suspected) exposure to covid-19: Secondary | ICD-10-CM

## 2019-04-24 DIAGNOSIS — B9689 Other specified bacterial agents as the cause of diseases classified elsewhere: Secondary | ICD-10-CM

## 2019-04-24 DIAGNOSIS — Z20828 Contact with and (suspected) exposure to other viral communicable diseases: Secondary | ICD-10-CM

## 2019-04-24 MED ORDER — AMOXICILLIN 500 MG PO CAPS: 500.0000 mg | ORAL_CAPSULE | Freq: Three times a day (TID) | ORAL | 0 refills | Status: DC

## 2019-04-24 NOTE — Progress Notes (Signed)
   Subjective:    Patient ID: Donshay Morita Ganger, male    DOB: [birth year], 68 y.o.   MRN: [id number]  Sinus Problem This is a new problem. The current episode started 1 to 4 weeks ago. Associated symptoms include congestion and sinus pressure. (Occasional low grade fever and occasional cough)   Body aches and fatigue   Review of Systems  HENT: Positive for congestion and sinus pressure.    Virtual Visit via Video Note  I connected with Collin D Parveen on 04/24/19 at 11:00 AM EST by a video enabled telemedicine application and verified that I am speaking with the correct person using two identifiers.  Location: Patient: home Provider: office   I discussed the limitations of evaluation and management by telemedicine and the availability of in person appointments. The patient expressed understanding and agreed to proceed.  History of Present Illness:    Observations/Objective:   Assessment and Plan:   Follow Up Instructions:    I discussed the assessment and treatment plan with the patient. The patient was provided an opportunity to ask questions and all were answered. The patient agreed with the plan and demonstrated an understanding of the instructions.   The patient was advised to call back or seek an in-person evaluation if the symptoms worsen or if the condition fails to improve as anticipated.  I provided 50minutes of non-face-to-face time during this encounter.   2 wks ago  ogoing cong and stuffiness  Greenish nasal disch   Tiredness and achey started Monday   Low gr fever off and on, mild temps up to 102        Not going on   Goes to church wears mask  Goes to restaurant picks up  Works at [employer], dispensing the rules        Objective:   Physical Exam  Virtual      Assessment & Plan:  Impression potential COVID-19 infection if not probable.  Discussed.  Will cover with antibiotics for potential element of sinusitis.  Covid  testing recommended.  Warning signs discussed carefully.  Symptom care discussed appropriate management in the home discussed

## 2019-04-26 LAB — NOVEL CORONAVIRUS, NAA: SARS-CoV-2, NAA: DETECTED — AB

## 2019-04-27 ENCOUNTER — Telehealth: Payer: Self-pay | Admitting: *Deleted

## 2019-04-27 NOTE — Telephone Encounter (Signed)
Patient notified of positive Covid test results. Patient verbalized understanding. Warning signs discussed with patient . Patient also advised of quarantine protocols per CDC and questions answered.

## 2019-04-27 NOTE — Telephone Encounter (Signed)
Patient had virtual visit with Dr Richardson Landry 04/24/19- Covid test came back today positive

## 2019-04-27 NOTE — Telephone Encounter (Signed)
Call pt and notify and discuss usual precautions

## 2019-04-30 ENCOUNTER — Encounter: Payer: Self-pay | Admitting: Family Medicine

## 2019-04-30 ENCOUNTER — Telehealth: Payer: Self-pay | Admitting: Family Medicine

## 2019-04-30 NOTE — Telephone Encounter (Signed)
techically can return the 11th with acute symtoms kicking in last day of nov but rec go ahead and stay out unt8il the 14th

## 2019-04-30 NOTE — Telephone Encounter (Signed)
Patient is requesting work note from being out of work from 04/24/19 and wants to know when he can return please. He states has no symptoms.

## 2019-06-11 ENCOUNTER — Ambulatory Visit (INDEPENDENT_AMBULATORY_CARE_PROVIDER_SITE_OTHER): Payer: Managed Care, Other (non HMO) | Admitting: Family Medicine

## 2019-06-11 ENCOUNTER — Other Ambulatory Visit: Payer: Self-pay

## 2019-06-11 ENCOUNTER — Encounter: Payer: Self-pay | Admitting: Family Medicine

## 2019-06-11 VITALS — BP 122/78 | Ht 72.0 in | Wt 190.8 lb

## 2019-06-11 DIAGNOSIS — Z0000 Encounter for general adult medical examination without abnormal findings: Secondary | ICD-10-CM

## 2019-06-11 DIAGNOSIS — D696 Thrombocytopenia, unspecified: Secondary | ICD-10-CM | POA: Diagnosis not present

## 2019-06-11 DIAGNOSIS — E781 Pure hyperglyceridemia: Secondary | ICD-10-CM

## 2019-06-11 DIAGNOSIS — Z125 Encounter for screening for malignant neoplasm of prostate: Secondary | ICD-10-CM

## 2019-06-11 NOTE — Progress Notes (Signed)
   Subjective:    Patient ID: Steven Glover, male    DOB: 18-Jul-1950, 69 y.o.   MRN: [id number]  HPI The patient comes in today for a wellness visit.  The patient states he tries to eat healthy. Still works full-time Hydrologist doing well No accidents or injuries lately  Fall Risk  06/11/2019 06/06/2018 03/03/2017  Falls in the past year? 0 1 No   He does see his rheumatologist on a regular basis.  A review of their health history was completed.  A review of medications was also completed.  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   Additional concerns: none  Review of Systems  Constitutional: Negative for diaphoresis and fatigue.  HENT: Negative for congestion and rhinorrhea.   Respiratory: Negative for cough and shortness of breath.   Cardiovascular: Negative for chest pain and leg swelling.  Gastrointestinal: Negative for abdominal pain and diarrhea.  Skin: Negative for color change and rash.  Neurological: Negative for dizziness and headaches.  Psychiatric/Behavioral: Negative for behavioral problems and confusion.       Objective:   Physical Exam Vitals reviewed.  Constitutional:      General: He is not in acute distress. HENT:     Head: Normocephalic and atraumatic.  Eyes:     General:        Right eye: No discharge.        Left eye: No discharge.  Neck:     Trachea: No tracheal deviation.  Cardiovascular:     Rate and Rhythm: Normal rate and regular rhythm.     Heart sounds: Normal heart sounds. No murmur.  Pulmonary:     Effort: Pulmonary effort is normal. No respiratory distress.     Breath sounds: Normal breath sounds.  Lymphadenopathy:     Cervical: No cervical adenopathy.  Skin:    General: Skin is warm and dry.   Neurological:     Mental Status: He is alert.     Coordination: Coordination normal.  Psychiatric:        Behavior: Behavior normal.   prostate exam normal  Cardiac exam normal no murmurs      Assessment & Plan:  Adult wellness-complete.wellness physical was conducted today. Importance of diet and exercise were discussed in detail.  In addition to this a discussion regarding safety was also covered. We also reviewed over immunizations and gave recommendations regarding current immunization needed for age.  In addition to this additional areas were also touched on including: Preventative health exams needed:  Colonoscopy 2023  Patient was advised yearly wellness exam Overall patient very healthy but very important for him to maintain a healthy diet regular activity He continues to work and states he will do so potentially for several more years Covid vaccine recommended when available Lab work ordered await results

## 2019-06-12 LAB — BASIC METABOLIC PANEL
BUN/Creatinine Ratio: 16 (ref 10–24)
BUN: 16 mg/dL (ref 8–27)
CO2: 24 mmol/L (ref 20–29)
Calcium: 9.3 mg/dL (ref 8.6–10.2)
Chloride: 103 mmol/L (ref 96–106)
Creatinine, Ser: 1.01 mg/dL (ref 0.76–1.27)
GFR calc Af Amer: 87 mL/min/{1.73_m2} (ref >59)
GFR calc non Af Amer: 75 mL/min/{1.73_m2} (ref >59)
Glucose: 82 mg/dL (ref 65–99)
Potassium: 4.4 mmol/L (ref 3.5–5.2)
Sodium: 138 mmol/L (ref 134–144)

## 2019-06-12 LAB — CBC WITH DIFFERENTIAL/PLATELET
Basophils Absolute: 0.1 10*3/uL (ref 0.0–0.2)
Basos: 1 %
EOS (ABSOLUTE): 0.1 10*3/uL (ref 0.0–0.4)
Eos: 2 %
Hematocrit: 42.6 % (ref 37.5–51.0)
Hemoglobin: 15.1 g/dL (ref 13.0–17.7)
Immature Grans (Abs): 0 10*3/uL (ref 0.0–0.1)
Immature Granulocytes: 1 %
Lymphocytes Absolute: 1.5 10*3/uL (ref 0.7–3.1)
Lymphs: 28 %
MCH: 32.9 pg (ref 26.6–33.0)
MCHC: 35.4 g/dL (ref 31.5–35.7)
MCV: 93 fL (ref 79–97)
Monocytes Absolute: 0.7 10*3/uL (ref 0.1–0.9)
Monocytes: 12 %
Neutrophils Absolute: 3.1 10*3/uL (ref 1.4–7.0)
Neutrophils: 56 %
Platelets: 161 10*3/uL (ref 150–450)
RBC: 4.59 x10E6/uL (ref 4.14–5.80)
RDW: 14.4 % (ref 11.6–15.4)
WBC: 5.5 10*3/uL (ref 3.4–10.8)

## 2019-06-12 LAB — PSA: Prostate Specific Ag, Serum: 2.3 ng/mL (ref 0.0–4.0)

## 2019-06-12 LAB — LIPID PANEL
Chol/HDL Ratio: 5.9 ratio — ABNORMAL HIGH (ref 0.0–5.0)
Cholesterol, Total: 189 mg/dL (ref 100–199)
HDL: 32 mg/dL — ABNORMAL LOW (ref >39)
LDL Chol Calc (NIH): 109 mg/dL — ABNORMAL HIGH (ref 0–99)
Triglycerides: 279 mg/dL — ABNORMAL HIGH (ref 0–149)
VLDL Cholesterol Cal: 48 mg/dL — ABNORMAL HIGH (ref 5–40)

## 2019-06-12 LAB — HEPATIC FUNCTION PANEL
ALT: 32 IU/L (ref 0–44)
AST: 19 IU/L (ref 0–40)
Albumin: 4.1 g/dL (ref 3.8–4.8)
Alkaline Phosphatase: 97 IU/L (ref 39–117)
Bilirubin Total: 0.6 mg/dL (ref 0.0–1.2)
Bilirubin, Direct: 0.16 mg/dL (ref 0.00–0.40)
Total Protein: 6.7 g/dL (ref 6.0–8.5)

## 2019-06-18 ENCOUNTER — Other Ambulatory Visit: Payer: Self-pay | Admitting: Family Medicine

## 2019-06-18 DIAGNOSIS — E781 Pure hyperglyceridemia: Secondary | ICD-10-CM

## 2019-06-18 MED ORDER — EZETIMIBE 10 MG PO TABS: 10.0000 mg | ORAL_TABLET | Freq: Every day | ORAL | 1 refills | Status: DC

## 2019-10-13 ENCOUNTER — Ambulatory Visit
Admission: EM | Admit: 2019-10-13 | Discharge: 2019-10-13 | Disposition: A | Discharge status: Home / Self Care | Payer: Managed Care, Other (non HMO) | Attending: Emergency Medicine | Admitting: Emergency Medicine

## 2019-10-13 ENCOUNTER — Other Ambulatory Visit: Payer: Self-pay

## 2019-10-13 ENCOUNTER — Encounter: Payer: Self-pay | Admitting: Emergency Medicine

## 2019-10-13 DIAGNOSIS — J302 Other seasonal allergic rhinitis: Secondary | ICD-10-CM | POA: Diagnosis not present

## 2019-10-13 MED ORDER — PREDNISONE 10 MG PO TABS: 20.0000 mg | ORAL_TABLET | Freq: Every day | ORAL | 0 refills | Status: AC

## 2019-10-13 MED ORDER — FLUTICASONE PROPIONATE 50 MCG/ACT NA SUSP: 1.0000 | Freq: Every day | NASAL | 0 refills | Status: DC

## 2019-10-13 MED ORDER — CETIRIZINE HCL 5 MG PO TABS: 5.0000 mg | ORAL_TABLET | Freq: Every day | ORAL | 0 refills | Status: DC

## 2019-10-13 NOTE — ED Provider Notes (Signed)
[facility]    CSN: [id number] Arrival date & time: 10/13/19  1305      History   Chief Complaint Chief Complaint  Patient presents with  . Nasal Congestion    HPI Steven Glover is a 69 y.o. male.   Presented to the urgent care for complaint of congestion, sinus pressure and sinus pain for the past 3 days.  Denies sick exposure to COVID, flu or strep.  Denies recent travel.  Denies aggravating or alleviating symptoms.  Denies previous COVID infection.   Denies fever, chills, fatigue,  rhinorrhea, sore throat, cough, SOB, wheezing, chest pain, nausea, vomiting, changes in bowel or bladder habits.    The history is provided by the patient. No language interpreter was used.    Past Medical History:  Diagnosis Date  . Acid reflux disease   . Allergy   . Asthma    as child  . BMI between 19-24,adult JUL 2012 191.8 LBS  . Collagen vascular disease ([place])   . GERD (gastroesophageal reflux disease)   . Internal hemorrhoids with other complication    TCS DEC 0000000  . Pulmonary nodule 2013   [facility] - Repeat 08/2012  . Rheumatoid arthritis(714.0)     Patient Active Problem List   Diagnosis Date Noted  . Hx of colonic polyps   . Thrombocytopenia ([place]) 06/23/2016  . BPH (benign prostatic hyperplasia) 01/28/2016  . Dyslipidemia (high LDL; low HDL) 01/28/2016  . Internal hemorrhoids with complication [id number]  . GERD (gastroesophageal reflux disease) 04/21/2011  . Colon cancer screening 04/21/2011  . Dysphagia 12/30/2010    Past Surgical History:  Procedure Laterality Date  . COLONOSCOPY  2009 NUR [place]   MULTIPLE SIMPLE ADENOMAS  . COLONOSCOPY N/A 05/02/2017   Dr. Oneida Alar: 4 simple adenomas removed, diverticulosis.  Next colonoscopy in 3 years.  . COLONOSCOPY W/ POLYPECTOMY  2010 NUR  . COLONOSCOPY WITH PROPOFOL  05/15/2012   SLF:Two sessile polyps ranging between 3-52mm in size were found in the ascending colon and rectum; multiple biopsies were  performed/Mild diverticulosis was noted in the sigmoid colon/The colon mucosa was otherwise normal/ Moderate sized internal hemorrhoids. TCS 04/2017.  Marland Kitchen ESOPHAGOGASTRODUODENOSCOPY  01/01/2011 [id number] CHRONIC GASTRITIS   SLF: Hiatal Hernia/mild gastritis/stricture in the distal esophagus  . POLYPECTOMY  05/15/2012   SIMPLE ADENOMA(2)  . SAVORY DILATION  01/01/2011   Procedure: SAVORY DILATION;  Surgeon: Dorothyann Peng, MD;  [facility];  Service: Endoscopy;  Laterality: N/A;  . SHOULDER OPEN ROTATOR CUFF REPAIR  2005  . WRIST FUSION         Home Medications    Prior to Admission medications   Medication Sig Start Date End Date Taking? Authorizing Provider  cetirizine (ZYRTEC) 5 MG tablet Take 1 tablet (5 mg total) by mouth daily. 10/13/19   Eddye Broxterman, Darrelyn Hillock, FNP  etanercept (ENBREL) 50 MG/ML injection Inject 50 mg into the skin every Monday.     [provider]  ezetimibe (ZETIA) 10 MG tablet Take 1 tablet (10 mg total) by mouth daily. 06/18/19   Kathyrn Drown, MD  fexofenadine (ALLERGY RELIEF) 180 MG tablet Take 180 mg by mouth daily as needed for allergies.    [provider]  fluticasone (FLONASE) 50 MCG/ACT nasal spray Place 1 spray into both nostrils daily for 14 days. 10/13/19 10/27/19  Zylpha Poynor, Darrelyn Hillock, FNP  folic acid (FOLVITE) 1 MG tablet Take 1 mg by mouth daily.      [provider]  methotrexate 2.5 MG tablet Take 20 mg by mouth every Monday. 7 tablets-Monday    [provider]  omeprazole (PRILOSEC) 20 MG capsule Take 1 capsule (20 mg total) by mouth daily before breakfast. 08/08/18   Mahala Menghini, PA-C  predniSONE (DELTASONE) 10 MG tablet Take 2 tablets (20 mg total) by mouth daily for 5 days. 10/13/19 10/18/19  Emerson Monte, FNP    Family History Family History  Problem Relation Age of Onset  . Diabetes Father   . Cancer Sister        breast  . Colon cancer Neg Hx   . Colon polyps Neg Hx     Social  History Social History   Tobacco Use  . Smoking status: Never Smoker  . Smokeless tobacco: Never Used  Substance Use Topics  . Alcohol use: No  . Drug use: No     Allergies   Cefprozil and Statins   Review of Systems Review of Systems  Constitutional: Negative.   HENT: Positive for congestion, sinus pressure and sinus pain.   Respiratory: Negative.   Cardiovascular: Negative.   Gastrointestinal: Negative.   Neurological: Negative.      Physical Exam Triage Vital Signs ED Triage Vitals [10/13/19 1316]  Enc Vitals Group     BP 122/70     Pulse Rate 74     Resp 18     Temp 98.1 F (36.7 C)     Temp Source Oral     SpO2 96 %     Weight      Height      Head Circumference      Peak Flow      Pain Score 3     Pain Loc      Pain Edu?      Excl. in [place]?    No data found.  Updated Vital Signs BP 122/70 (BP Location: Right Arm)   Pulse 74   Temp 98.1 F (36.7 C) (Oral)   Resp 18   SpO2 96%   Visual Acuity Right Eye Distance:   Left Eye Distance:   Bilateral Distance:    Right Eye Near:   Left Eye Near:    Bilateral Near:     Physical Exam Vitals and nursing note reviewed.  Constitutional:      General: He is not in acute distress.    Appearance: Normal appearance. He is normal weight. He is not ill-appearing, toxic-appearing or diaphoretic.  HENT:     Head: Normocephalic and atraumatic.     Right Ear: Tympanic membrane, ear canal and external ear normal. There is no impacted cerumen.     Left Ear: Ear canal and external ear normal. A middle ear effusion is present. There is no impacted cerumen.     Nose: Congestion and rhinorrhea present.  Cardiovascular:     Rate and Rhythm: Normal rate and regular rhythm.     Pulses: Normal pulses.     Heart sounds: Normal heart sounds. No murmur. No friction rub. No gallop.   Pulmonary:     Effort: Pulmonary effort is normal. No respiratory distress.     Breath sounds: Normal breath sounds. No stridor. No  wheezing, rhonchi or rales.  Chest:     Chest wall: No tenderness.  Neurological:     Mental Status: He is alert.      UC Treatments / Results  Labs (all labs ordered are listed, but only abnormal results are displayed) Labs Reviewed - No  data to display  EKG   Radiology No results found.  Procedures Procedures (including critical care time)  Medications Ordered in UC Medications - No data to display  Initial Impression / Assessment and Plan / UC Course  I have reviewed the triage vital signs and the nursing notes.  Pertinent labs & imaging results that were available during my care of the patient were reviewed by me and considered in my medical decision making (see chart for details).   Patient is stable for discharge.  Symptoms likely from seasonal allergy.  Will prescribe Zyrtec, Flonase, and short-term prednisone.  Final Clinical Impressions(s) / UC Diagnoses   Final diagnoses:  Seasonal allergies     Discharge Instructions     Zyrtec, Flonase, and prednisone were prescribed Take as directed Follow-up with PCP Return or go to ED for worsening of symptoms    ED Prescriptions    Medication Sig Dispense Auth. Provider   fluticasone (FLONASE) 50 MCG/ACT nasal spray Place 1 spray into both nostrils daily for 14 days. 16 g Aubrii Sharpless S, FNP   predniSONE (DELTASONE) 10 MG tablet Take 2 tablets (20 mg total) by mouth daily for 5 days. 10 tablet Tyrianna Lightle, Darrelyn Hillock, FNP   cetirizine (ZYRTEC) 5 MG tablet Take 1 tablet (5 mg total) by mouth daily. 30 tablet Keiyon Plack, Darrelyn Hillock, FNP     PDMP not reviewed this encounter.   Emerson Monte, [place] 10/13/19 1333

## 2019-10-13 NOTE — ED Triage Notes (Signed)
Pt here for sinus pressure and congestion x 3 days; pt sts hx of same with sinus infection; pt sts has had covid vaccination

## 2019-10-13 NOTE — Discharge Instructions (Addendum)
Zyrtec, Flonase, and prednisone were prescribed Take as directed Follow-up with PCP Return or go to ED for worsening of symptoms

## 2019-12-12 ENCOUNTER — Other Ambulatory Visit: Payer: Self-pay | Admitting: Family Medicine

## 2019-12-12 ENCOUNTER — Other Ambulatory Visit: Payer: Self-pay | Admitting: *Deleted

## 2019-12-12 MED ORDER — EZETIMIBE 10 MG PO TABS: 10.0000 mg | ORAL_TABLET | Freq: Every day | ORAL | 0 refills | Status: DC

## 2019-12-18 LAB — LIPID PANEL
Chol/HDL Ratio: 4.6 ratio (ref 0.0–5.0)
Cholesterol, Total: 143 mg/dL (ref 100–199)
HDL: 31 mg/dL — ABNORMAL LOW (ref >39)
LDL Chol Calc (NIH): 74 mg/dL (ref 0–99)
Triglycerides: 231 mg/dL — ABNORMAL HIGH (ref 0–149)
VLDL Cholesterol Cal: 38 mg/dL (ref 5–40)

## 2019-12-19 ENCOUNTER — Other Ambulatory Visit: Payer: Self-pay | Admitting: *Deleted

## 2019-12-19 DIAGNOSIS — E782 Mixed hyperlipidemia: Secondary | ICD-10-CM

## 2019-12-19 DIAGNOSIS — Z125 Encounter for screening for malignant neoplasm of prostate: Secondary | ICD-10-CM

## 2019-12-19 DIAGNOSIS — Z79899 Other long term (current) drug therapy: Secondary | ICD-10-CM

## 2020-01-22 ENCOUNTER — Encounter: Payer: Self-pay | Admitting: Internal Medicine

## 2020-02-16 ENCOUNTER — Ambulatory Visit
Admission: EM | Admit: 2020-02-16 | Discharge: 2020-02-16 | Disposition: A | Discharge status: Home / Self Care | Payer: Managed Care, Other (non HMO) | Attending: Emergency Medicine | Admitting: Emergency Medicine

## 2020-02-16 ENCOUNTER — Encounter: Payer: Self-pay | Admitting: Emergency Medicine

## 2020-02-16 DIAGNOSIS — J019 Acute sinusitis, unspecified: Secondary | ICD-10-CM | POA: Diagnosis not present

## 2020-02-16 DIAGNOSIS — B9689 Other specified bacterial agents as the cause of diseases classified elsewhere: Secondary | ICD-10-CM | POA: Diagnosis not present

## 2020-02-16 MED ORDER — AMOXICILLIN 500 MG PO CAPS
500.0000 mg | ORAL_CAPSULE | Freq: Three times a day (TID) | ORAL | 0 refills | Status: DC
Start: 2020-02-16 — End: 2020-03-05

## 2020-02-16 NOTE — ED Provider Notes (Signed)
[facility]   [id number] 02/16/20 Arrival Time: 54   Chief Complaint  Patient presents with  . Facial Pain     SUBJECTIVE: History from: patient.  Steven Glover is a 69 y.o. male presents to the urgent care with a complaint of sinus pressure sinus pain and some congestion for the past 2 days.  Report yellowish-green nasal discharge..  Denies sick exposure to COVID, flu or strep.  Denies recent travel.  Has tried OTC medication without relief.  Denies aggravating factors.  Denies previous symptoms in the past.   Denies fever, chills, fatigue, rhinorrhea, sore throat, SOB, wheezing, chest pain, nausea, changes in bowel or bladder habits.       Past Medical History:  Diagnosis Date  . Acid reflux disease   . Allergy   . Asthma    as child  . BMI between 19-24,adult JUL 2012 191.8 LBS  . Collagen vascular disease ([place])   . GERD (gastroesophageal reflux disease)   . Internal hemorrhoids with other complication    TCS DEC 5364  . Pulmonary nodule 2013   [facility] - Repeat 08/2012  . Rheumatoid arthritis(714.0)    Past Surgical History:  Procedure Laterality Date  . COLONOSCOPY  2009 NUR [place]   MULTIPLE SIMPLE ADENOMAS  . COLONOSCOPY N/A 05/02/2017   Dr. Oneida Alar: 4 simple adenomas removed, diverticulosis.  Next colonoscopy in 3 years.  . COLONOSCOPY W/ POLYPECTOMY  2010 NUR  . COLONOSCOPY WITH PROPOFOL  05/15/2012   SLF:Two sessile polyps ranging between 3-39mm in size were found in the ascending colon and rectum; multiple biopsies were performed/Mild diverticulosis was noted in the sigmoid colon/The colon mucosa was otherwise normal/ Moderate sized internal hemorrhoids. TCS 04/2017.  Marland Kitchen ESOPHAGOGASTRODUODENOSCOPY  01/01/2011 [id number] CHRONIC GASTRITIS   SLF: Hiatal Hernia/mild gastritis/stricture in the distal esophagus  . POLYPECTOMY  05/15/2012   SIMPLE ADENOMA(2)  . SAVORY DILATION  01/01/2011   Procedure: SAVORY DILATION;  Surgeon: Dorothyann Peng, MD;  [facility];  Service: Endoscopy;  Laterality: N/A;  . SHOULDER OPEN ROTATOR CUFF REPAIR  2005  . WRIST FUSION     Allergies  Allergen Reactions  . Cefprozil     Nausea, felt bad  . Statins     myalgias   No current facility-administered medications on file prior to encounter.   Current Outpatient Medications on File Prior to Encounter  Medication Sig Dispense Refill  . cetirizine (ZYRTEC) 5 MG tablet Take 1 tablet (5 mg total) by mouth daily. 30 tablet 0  . etanercept (ENBREL) 50 MG/ML injection Inject 50 mg into the skin every Monday.     . ezetimibe (ZETIA) 10 MG tablet Take 1 tablet (10 mg total) by mouth daily. 90 tablet 0  . fexofenadine (ALLERGY RELIEF) 180 MG tablet Take 180 mg by mouth daily as needed for allergies.    . fluticasone (FLONASE) 50 MCG/ACT nasal spray Place 1 spray into both nostrils daily for 14 days. 16 g 0  . folic acid (FOLVITE) 1 MG tablet Take 1 mg by mouth daily.      . methotrexate 2.5 MG tablet Take 20 mg by mouth every Monday. 7 tablets-Monday    . omeprazole (PRILOSEC) 20 MG capsule Take 1 capsule (20 mg total) by mouth daily before breakfast. 90 capsule 3   Social History   Socioeconomic History  . Marital status: Married    Spouse name: Not on file  . Number of children: Not on file  . Years  of education: Not on file  . Highest education level: Not on file  Occupational History  . Not on file  Tobacco Use  . Smoking status: Never Smoker  . Smokeless tobacco: Never Used  Vaping Use  . Vaping Use: Never used  Substance and Sexual Activity  . Alcohol use: No  . Drug use: No  . Sexual activity: Yes    Birth control/protection: None  Other Topics Concern  . Not on file  Social History Narrative   [place] ([facility]), 2 CHILDREN   Social Determinants of Health   Financial Resource Strain:   . Difficulty of Paying Living Expenses: Not on file  Food Insecurity:   . Worried About Charity fundraiser in the Last Year: Not on  file  . Ran Out of Food in the Last Year: Not on file  Transportation Needs:   . Lack of Transportation (Medical): Not on file  . Lack of Transportation (Non-Medical): Not on file  Physical Activity:   . Days of Exercise per Week: Not on file  . Minutes of Exercise per Session: Not on file  Stress:   . Feeling of Stress : Not on file  Social Connections:   . Frequency of Communication with Friends and Family: Not on file  . Frequency of Social Gatherings with Friends and Family: Not on file  . Attends Religious Services: Not on file  . Active Member of Clubs or Organizations: Not on file  . Attends Archivist Meetings: Not on file  . Marital Status: Not on file  Intimate Partner Violence:   . Fear of Current or Ex-Partner: Not on file  . Emotionally Abused: Not on file  . Physically Abused: Not on file  . Sexually Abused: Not on file   Family History  Problem Relation Age of Onset  . Diabetes Father   . Cancer Sister        breast  . Colon cancer Neg Hx   . Colon polyps Neg Hx     OBJECTIVE:  Vitals:   02/16/20 1523 02/16/20 1524  BP: 122/71   Pulse: 64   Resp: 19   Temp: 98 F (36.7 C)   TempSrc: Oral   SpO2: 96%   Weight:  205 lb (93 kg)  Height:  6\' 3"  (1.905 m)     Physical Exam Vitals and nursing note reviewed.  Constitutional:      General: He is not in acute distress.    Appearance: Normal appearance. He is normal weight. He is not ill-appearing, toxic-appearing or diaphoretic.  HENT:     Right Ear: Tympanic membrane, ear canal and external ear normal. There is no impacted cerumen.     Left Ear: Tympanic membrane, ear canal and external ear normal.     Nose:     Right Sinus: Maxillary sinus tenderness and frontal sinus tenderness present.     Left Sinus: Maxillary sinus tenderness and frontal sinus tenderness present.  Cardiovascular:     Rate and Rhythm: Normal rate and regular rhythm.     Pulses: Normal pulses.     Heart sounds: Normal  heart sounds. No murmur heard.  No friction rub. No gallop.   Pulmonary:     Effort: Pulmonary effort is normal. No respiratory distress.     Breath sounds: Normal breath sounds. No stridor. No wheezing, rhonchi or rales.  Chest:     Chest wall: No tenderness.  Neurological:     Mental Status:  He is alert and oriented to person, place, and time.     LABS:  No results found for this or any previous visit (from the past 24 hour(s)).   ASSESSMENT & PLAN:  1. Acute bacterial rhinosinusitis     Meds ordered this encounter  Medications  . amoxicillin (AMOXIL) 500 MG capsule    Sig: Take 1 capsule (500 mg total) by mouth 3 (three) times daily.    Dispense:  21 capsule    Refill:  0    Discharge instructions  Rest and push fluids Amoxicillin prescribed.  Take as directed and to completion Continue to take Flonase as prescribed Continue with OTC ibuprofen/tylenol as needed for pain Follow up with PCP or [facility] if symptoms persists Return or go to the ED if you have any new or worsening symptoms such as fever, chills, worsening sinus pain/pressure, cough, sore throat, chest pain, shortness of breath, abdominal pain, changes in bowel or bladder habits, etc...  Reviewed expectations re: course of current medical issues. Questions answered. Outlined signs and symptoms indicating need for more acute intervention. Patient verbalized understanding. After Visit Summary given.         Emerson Monte, [place] 02/16/20 1541

## 2020-02-16 NOTE — ED Triage Notes (Addendum)
Pt states he had sinus pressure and pain, nasal congestion x 2 days. Pt states he does not need a covid test, he had covid in 2019 and he has had both vaccines.

## 2020-02-16 NOTE — Discharge Instructions (Addendum)
Rest and push fluids Amoxicillin prescribed.  Take as directed and to completion Continue to take Flonase as prescribed Continue with OTC ibuprofen/tylenol as needed for pain Follow up with PCP or [facility] if symptoms persists Return or go to the ED if you have any new or worsening symptoms such as fever, chills, worsening sinus pain/pressure, cough, sore throat, chest pain, shortness of breath, abdominal pain, changes in bowel or bladder habits, etc..Steven Glover

## 2020-02-22 ENCOUNTER — Other Ambulatory Visit: Payer: Self-pay

## 2020-02-22 ENCOUNTER — Other Ambulatory Visit (INDEPENDENT_AMBULATORY_CARE_PROVIDER_SITE_OTHER): Payer: Managed Care, Other (non HMO) | Admitting: *Deleted

## 2020-02-22 DIAGNOSIS — Z23 Encounter for immunization: Secondary | ICD-10-CM

## 2020-03-05 ENCOUNTER — Other Ambulatory Visit: Payer: Self-pay

## 2020-03-05 ENCOUNTER — Telehealth: Payer: Self-pay | Admitting: *Deleted

## 2020-03-05 ENCOUNTER — Encounter: Payer: Self-pay | Admitting: Internal Medicine

## 2020-03-05 ENCOUNTER — Ambulatory Visit: Payer: Managed Care, Other (non HMO) | Admitting: Internal Medicine

## 2020-03-05 ENCOUNTER — Encounter: Payer: Self-pay | Admitting: *Deleted

## 2020-03-05 VITALS — BP 136/73 | HR 65 | Temp 97.1°F | Ht 75.0 in | Wt 204.8 lb

## 2020-03-05 DIAGNOSIS — Z86010 Personal history of colonic polyps: Secondary | ICD-10-CM

## 2020-03-05 DIAGNOSIS — K2100 Gastro-esophageal reflux disease with esophagitis, without bleeding: Secondary | ICD-10-CM

## 2020-03-05 NOTE — Telephone Encounter (Signed)
LMTCB to schedule TCS with Dr. Abbey Chatters, ASA 3

## 2020-03-05 NOTE — Telephone Encounter (Signed)
Patient returned call. He has been scheduled for 12/7 at 7:30am. Aware will mail prep instructions with pre-op/covid test appt. Confirmed mailing address.

## 2020-03-05 NOTE — Patient Instructions (Addendum)
We will schedule you for surveillance colonoscopy today in clinic due to history of colon polyps.  Further recommendations to follow.  Lifestyle and home remedies TO MANAGE REFLUX/HEARTBURN    You may eliminate or reduce the frequency of heartburn by making the following lifestyle changes:    Control your weight. Being overweight is a major risk factor for heartburn and GERD. Excess pounds put pressure on your abdomen, pushing up your stomach and causing acid to back up into your esophagus.     Eat smaller meals. 4 TO 6 MEALS A DAY. This reduces pressure on the lower esophageal sphincter, helping to prevent the valve from opening and acid from washing back into your esophagus.      Loosen your belt. Clothes that fit tightly around your waist put pressure on your abdomen and the lower esophageal sphincter.      Eliminate heartburn triggers. Everyone has specific triggers. Common triggers such as fatty or fried foods, spicy food, tomato sauce, carbonated beverages, alcohol, chocolate, mint, garlic, onion, caffeine and nicotine may make heartburn worse.     Avoid stooping or bending. Tying your shoes is OK. Bending over for longer periods to weed your garden isn't, especially soon after eating.     Don't lie down after a meal. Wait at least three to four hours after eating before going to bed, and don't lie down right after eating.    At [facility] we value your feedback. You may receive a survey about your visit today. Please share your experience as we strive to create trusting relationships with our patients to provide genuine, compassionate, quality care.  We appreciate your understanding and patience as we review any laboratory studies, imaging, and other diagnostic tests that are ordered as we care for you. Our office policy is 5 business days for review of these results, and any emergent or urgent results are addressed in a timely manner for your best interest. If  you do not hear from our office in 1 week, please contact us.   We also encourage the use of MyChart, which contains your medical information for your review as well. If you are not enrolled in this feature, an access code is on this after visit summary for your convenience. Thank you for allowing [place] to be involved in your care.  It was great to see you today!  I hope you have a great rest of your fall!!  Jaymin Waln K. Abbey Chatters, D.O. Gastroenterology and Hepatology [facility]

## 2020-03-05 NOTE — Progress Notes (Signed)
Referring Provider: Kathyrn Drown, MD Primary Care Physician:  Kathyrn Drown, MD Primary GI:  Dr. Abbey Chatters  Chief Complaint  Patient presents with  . Gastroesophageal Reflux    doing fine    HPI:   Steven Glover is a 69 y.o. male who presents to the clinic today for follow-up visit.  Last seen over 1 year ago.  Has a history of chronic reflux that is well controlled on omeprazole 20 mg daily.  Does note that he avoids certain triggers as best he can.  Has history of dysphagia status post dilation in the past though he says he currently does not have any issues swallowing.  He is due for surveillance colonoscopy.  Last colonoscopy 3 years ago with 4 polyps removed all of which were tubular adenomas.  No family history of colorectal malignancy.  No melena hematochezia.  No unintentional weight loss.  No abdominal pain.  Otherwise he has no other complaints.  Past Medical History:  Diagnosis Date  . Acid reflux disease   . Allergy   . Asthma    as child  . BMI between 19-24,adult JUL 2012 191.8 LBS  . Collagen vascular disease ([place])   . GERD (gastroesophageal reflux disease)   . Internal hemorrhoids with other complication    TCS DEC 9381  . Pulmonary nodule 2013   [facility] - Repeat 08/2012  . Rheumatoid arthritis(714.0)     Past Surgical History:  Procedure Laterality Date  . COLONOSCOPY  2009 NUR [place]   MULTIPLE SIMPLE ADENOMAS  . COLONOSCOPY N/A 05/02/2017   Dr. Oneida Alar: 4 simple adenomas removed, diverticulosis.  Next colonoscopy in 3 years.  . COLONOSCOPY W/ POLYPECTOMY  2010 NUR  . COLONOSCOPY WITH PROPOFOL  05/15/2012   SLF:Two sessile polyps ranging between 3-66mm in size were found in the ascending colon and rectum; multiple biopsies were performed/Mild diverticulosis was noted in the sigmoid colon/The colon mucosa was otherwise normal/ Moderate sized internal hemorrhoids. TCS 04/2017.  Marland Kitchen ESOPHAGOGASTRODUODENOSCOPY  01/01/2011 [id number] CHRONIC GASTRITIS   SLF: Hiatal  Hernia/mild gastritis/stricture in the distal esophagus  . POLYPECTOMY  05/15/2012   SIMPLE ADENOMA(2)  . SAVORY DILATION  01/01/2011   Procedure: SAVORY DILATION;  Surgeon: Dorothyann Peng, MD;  Location: [facility];  Service: Endoscopy;  Laterality: N/A;  . SHOULDER OPEN ROTATOR CUFF REPAIR  2005  . WRIST FUSION      Current Outpatient Medications  Medication Sig Dispense Refill  . cetirizine (ZYRTEC) 5 MG tablet Take 1 tablet (5 mg total) by mouth daily. 30 tablet 0  . etanercept (ENBREL) 50 MG/ML injection Inject 50 mg into the skin every Monday.     . ezetimibe (ZETIA) 10 MG tablet Take 1 tablet (10 mg total) by mouth daily. 90 tablet 0  . fexofenadine (ALLERGY RELIEF) 180 MG tablet Take 180 mg by mouth daily as needed for allergies.    . folic acid (FOLVITE) 1 MG tablet Take 1 mg by mouth daily.      . methotrexate 2.5 MG tablet Take 20 mg by mouth every Monday. 7 tablets-Monday    . omeprazole (PRILOSEC) 20 MG capsule Take 1 capsule (20 mg total) by mouth daily before breakfast. 90 capsule 3   No current facility-administered medications for this visit.    Allergies as of 03/05/2020 - Review Complete 03/05/2020  Allergen Reaction Noted  . Cefprozil  02/21/2015  . Statins  11/30/2018    Family History  Problem Relation Age of Onset  .  Diabetes Father   . Cancer Sister        breast  . Colon cancer Neg Hx   . Colon polyps Neg Hx     Social History   Socioeconomic History  . Marital status: Married    Spouse name: Not on file  . Number of children: Not on file  . Years of education: Not on file  . Highest education level: Not on file  Occupational History  . Not on file  Tobacco Use  . Smoking status: Never Smoker  . Smokeless tobacco: Never Used  Vaping Use  . Vaping Use: Never used  Substance and Sexual Activity  . Alcohol use: No  . Drug use: No  . Sexual activity: Yes    Birth control/protection: None  Other Topics Concern  . Not on file  Social  History Narrative   [place] (ED [facility]), 2 CHILDREN   Social Determinants of Health   Financial Resource Strain:   . Difficulty of Paying Living Expenses: Not on file  Food Insecurity:   . Worried About Charity fundraiser in the Last Year: Not on file  . Ran Out of Food in the Last Year: Not on file  Transportation Needs:   . Lack of Transportation (Medical): Not on file  . Lack of Transportation (Non-Medical): Not on file  Physical Activity:   . Days of Exercise per Week: Not on file  . Minutes of Exercise per Session: Not on file  Stress:   . Feeling of Stress : Not on file  Social Connections:   . Frequency of Communication with Friends and Family: Not on file  . Frequency of Social Gatherings with Friends and Family: Not on file  . Attends Religious Services: Not on file  . Active Member of Clubs or Organizations: Not on file  . Attends Archivist Meetings: Not on file  . Marital Status: Not on file    Subjective: Review of Systems  Constitutional: Negative for chills and fever.  HENT: Negative for congestion and hearing loss.   Eyes: Negative for blurred vision and double vision.  Respiratory: Negative for cough and shortness of breath.   Cardiovascular: Negative for chest pain and palpitations.  Gastrointestinal: Negative for abdominal pain, blood in stool, constipation, diarrhea, heartburn, melena and vomiting.  Genitourinary: Negative for dysuria and urgency.  Musculoskeletal: Negative for joint pain and myalgias.  Skin: Negative for itching and rash.  Neurological: Negative for dizziness and headaches.  Psychiatric/Behavioral: Negative for depression. The patient is not nervous/anxious.      Objective: BP 136/73   Pulse 65   Temp (!) 97.1 F (36.2 C) (Oral)   Ht 6\' 3"  (1.905 m)   Wt 204 lb 12.8 oz (92.9 kg)   BMI 25.60 kg/m  Physical Exam Constitutional:      Appearance: Normal appearance.  HENT:     Head: Normocephalic and  atraumatic.  Eyes:     Extraocular Movements: Extraocular movements intact.     Conjunctiva/sclera: Conjunctivae normal.  Cardiovascular:     Rate and Rhythm: Normal rate and regular rhythm.  Pulmonary:     Effort: Pulmonary effort is normal.     Breath sounds: Normal breath sounds.  Abdominal:     General: Bowel sounds are normal.     Palpations: Abdomen is soft.  Musculoskeletal:        General: Normal range of motion.     Cervical back: Normal range of motion and neck supple.  Skin:    General: Skin is warm.  Neurological:     General: No focal deficit present.     Mental Status: He is alert and oriented to person, place, and time.  Psychiatric:        Mood and Affect: Mood normal.        Behavior: Behavior normal.      Assessment: *History of adenomatous colon polyps *Chronic reflux-well controlled on omeprazole 20 mg daily  Plan: Will schedule for  surveillance colonoscopy.The risks including infection, bleed, or perforation as well as benefits, limitations, alternatives and imponderables have been reviewed with the patient. Questions have been answered. All parties agreeable.  Continue on omeprazole 20 mg daily for chronic reflux.  Lifestyle reflux practices printed out for patient.  03/05/2020 8:43 AM   Disclaimer: This note was dictated with voice recognition software. Similar sounding words can inadvertently be transcribed and may not be corrected upon review.

## 2020-03-12 ENCOUNTER — Other Ambulatory Visit: Payer: Self-pay | Admitting: Family Medicine

## 2020-03-12 ENCOUNTER — Telehealth: Payer: Self-pay | Admitting: *Deleted

## 2020-03-12 NOTE — Telephone Encounter (Signed)
Called pt. Made aware new procedure time on 12/7 is now 10:00am. He voiced understanding. Endo also aware

## 2020-04-23 NOTE — Patient Instructions (Signed)
Steven Glover  04/23/2020     @PREFPERIOPPHARMACY @   Your procedure is scheduled on  04/29/2020.  Report to [facility] at  0800  A.M.  Call this number if you have problems the morning of surgery:  [phone]   Remember:  Follow the diet and prep instructions given to you by the office.                      Take these medicines the morning of surgery with A SIP OF WATER  Prilosec.    Do not wear jewelry, make-up or nail polish.  Do not wear lotions, powders, or perfumes. Please wear deodorant and brush your teeth.  Do not shave 48 hours prior to surgery.  Men may shave face and neck.  Do not bring valuables to the hospital.  [facility] is not responsible for any belongings or valuables.  Contacts, dentures or bridgework may not be worn into surgery.  Leave your suitcase in the car.  After surgery it may be brought to your room.  For patients admitted to the hospital, discharge time will be determined by your treatment team.  Patients discharged the day of surgery will not be allowed to drive home.   Name and phone number of your driver:   family Special instructions:  DO NOT smoke the morning of your procedure.  Please read over the following fact sheets that you were given. Anesthesia Post-op Instructions and Care and Recovery After Surgery       Colonoscopy, Adult, Care After This sheet gives you information about how to care for yourself after your procedure. Your health care provider may also give you more specific instructions. If you have problems or questions, contact your health care provider. What can I expect after the procedure? After the procedure, it is common to have:  A small amount of blood in your stool for 24 hours after the procedure.  Some gas.  Mild cramping or bloating of your abdomen. Follow these instructions at home: Eating and drinking   Drink enough fluid to keep your urine pale yellow.  Follow instructions from your  health care provider about eating or drinking restrictions.  Resume your normal diet as instructed by your health care provider. Avoid heavy or fried foods that are hard to digest. Activity  Rest as told by your health care provider.  Avoid sitting for a long time without moving. Get up to take short walks every 1-2 hours. This is important to improve blood flow and breathing. Ask for help if you feel weak or unsteady.  Return to your normal activities as told by your health care provider. Ask your health care provider what activities are safe for you. Managing cramping and bloating   Try walking around when you have cramps or feel bloated.  Apply heat to your abdomen as told by your health care provider. Use the heat source that your health care provider recommends, such as a moist heat pack or a heating pad. ? Place a towel between your skin and the heat source. ? Leave the heat on for 20-30 minutes. ? Remove the heat if your skin turns bright red. This is especially important if you are unable to feel pain, heat, or cold. You may have a greater risk of getting burned. General instructions  For the first 24 hours after the procedure: ? Do not drive or use machinery. ? Do not sign important documents. ?  Do not drink alcohol. ? Do your regular daily activities at a slower pace than normal. ? Eat soft foods that are easy to digest.  Take over-the-counter and prescription medicines only as told by your health care provider.  Keep all follow-up visits as told by your health care provider. This is important. Contact a health care provider if:  You have blood in your stool 2-3 days after the procedure. Get help right away if you have:  More than a small spotting of blood in your stool.  Large blood clots in your stool.  Swelling of your abdomen.  Nausea or vomiting.  A fever.  Increasing pain in your abdomen that is not relieved with medicine. Summary  After the procedure,  it is common to have a small amount of blood in your stool. You may also have mild cramping and bloating of your abdomen.  For the first 24 hours after the procedure, do not drive or use machinery, sign important documents, or drink alcohol.  Get help right away if you have a lot of blood in your stool, nausea or vomiting, a fever, or increased pain in your abdomen. This information is not intended to replace advice given to you by your health care provider. Make sure you discuss any questions you have with your health care provider. Document Revised: 12/04/2018 Document Reviewed: 12/04/2018 Elsevier Patient Education  [place] After These instructions provide you with information about caring for yourself after your procedure. Your health care provider may also give you more specific instructions. Your treatment has been planned according to current medical practices, but problems sometimes occur. Call your health care provider if you have any problems or questions after your procedure. What can I expect after the procedure? After your procedure, you may:  Feel sleepy for several hours.  Feel clumsy and have poor balance for several hours.  Feel forgetful about what happened after the procedure.  Have poor judgment for several hours.  Feel nauseous or vomit.  Have a sore throat if you had a breathing tube during the procedure. Follow these instructions at home: For at least 24 hours after the procedure:      Have a responsible adult stay with you. It is important to have someone help care for you until you are awake and alert.  Rest as needed.  Do not: ? Participate in activities in which you could fall or become injured. ? Drive. ? Use heavy machinery. ? Drink alcohol. ? Take sleeping pills or medicines that cause drowsiness. ? Make important decisions or sign legal documents. ? Take care of children on your own. Eating and  drinking  Follow the diet that is recommended by your health care provider.  If you vomit, drink water, juice, or soup when you can drink without vomiting.  Make sure you have little or no nausea before eating solid foods. General instructions  Take over-the-counter and prescription medicines only as told by your health care provider.  If you have sleep apnea, surgery and certain medicines can increase your risk for breathing problems. Follow instructions from your health care provider about wearing your sleep device: ? Anytime you are sleeping, including during daytime naps. ? While taking prescription pain medicines, sleeping medicines, or medicines that make you drowsy.  If you smoke, do not smoke without supervision.  Keep all follow-up visits as told by your health care provider. This is important. Contact a health care provider if:  You keep  feeling nauseous or you keep vomiting.  You feel light-headed.  You develop a rash.  You have a fever. Get help right away if:  You have trouble breathing. Summary  For several hours after your procedure, you may feel sleepy and have poor judgment.  Have a responsible adult stay with you for at least 24 hours or until you are awake and alert. This information is not intended to replace advice given to you by your health care provider. Make sure you discuss any questions you have with your health care provider. Document Revised: 08/08/2017 Document Reviewed: 08/31/2015 Elsevier Patient Education  [place].

## 2020-04-25 ENCOUNTER — Encounter (HOSPITAL_COMMUNITY): Payer: Self-pay

## 2020-04-25 ENCOUNTER — Other Ambulatory Visit: Payer: Self-pay

## 2020-04-25 ENCOUNTER — Other Ambulatory Visit (HOSPITAL_COMMUNITY)
Admission: RE | Admit: 2020-04-25 | Discharge: 2020-04-25 | Disposition: A | Discharge status: Home / Self Care | Payer: Managed Care, Other (non HMO) | Source: Ambulatory Visit | Attending: Internal Medicine | Admitting: Internal Medicine

## 2020-04-25 ENCOUNTER — Encounter (HOSPITAL_COMMUNITY)
Admission: RE | Admit: 2020-04-25 | Discharge: 2020-04-25 | Disposition: A | Discharge status: Home / Self Care | Payer: Managed Care, Other (non HMO) | Source: Ambulatory Visit | Attending: Internal Medicine | Admitting: Internal Medicine

## 2020-04-25 DIAGNOSIS — Z01812 Encounter for preprocedural laboratory examination: Secondary | ICD-10-CM | POA: Insufficient documentation

## 2020-04-25 DIAGNOSIS — Z20822 Contact with and (suspected) exposure to covid-19: Secondary | ICD-10-CM | POA: Insufficient documentation

## 2020-04-26 LAB — SARS CORONAVIRUS 2 (TAT 6-24 HRS): SARS Coronavirus 2: NEGATIVE

## 2020-04-29 ENCOUNTER — Ambulatory Visit (HOSPITAL_COMMUNITY): Payer: Managed Care, Other (non HMO) | Admitting: Anesthesiology

## 2020-04-29 ENCOUNTER — Ambulatory Visit (HOSPITAL_COMMUNITY)
Admission: RE | Admit: 2020-04-29 | Discharge: 2020-04-29 | Disposition: A | Discharge status: Home / Self Care | Payer: Managed Care, Other (non HMO) | Attending: Internal Medicine | Admitting: Internal Medicine

## 2020-04-29 ENCOUNTER — Encounter (HOSPITAL_COMMUNITY): Payer: Self-pay

## 2020-04-29 ENCOUNTER — Encounter (HOSPITAL_COMMUNITY)
Admission: RE | Disposition: A | Discharge status: Home / Self Care | Payer: Self-pay | Source: Home / Self Care | Attending: Internal Medicine

## 2020-04-29 DIAGNOSIS — K5730 Diverticulosis of large intestine without perforation or abscess without bleeding: Secondary | ICD-10-CM | POA: Insufficient documentation

## 2020-04-29 DIAGNOSIS — D123 Benign neoplasm of transverse colon: Secondary | ICD-10-CM | POA: Insufficient documentation

## 2020-04-29 DIAGNOSIS — K648 Other hemorrhoids: Secondary | ICD-10-CM | POA: Diagnosis not present

## 2020-04-29 DIAGNOSIS — K635 Polyp of colon: Secondary | ICD-10-CM | POA: Diagnosis not present

## 2020-04-29 DIAGNOSIS — K219 Gastro-esophageal reflux disease without esophagitis: Secondary | ICD-10-CM | POA: Diagnosis not present

## 2020-04-29 DIAGNOSIS — Z86010 Personal history of colonic polyps: Secondary | ICD-10-CM | POA: Insufficient documentation

## 2020-04-29 DIAGNOSIS — Z09 Encounter for follow-up examination after completed treatment for conditions other than malignant neoplasm: Secondary | ICD-10-CM | POA: Insufficient documentation

## 2020-04-29 DIAGNOSIS — Z888 Allergy status to other drugs, medicaments and biological substances status: Secondary | ICD-10-CM | POA: Insufficient documentation

## 2020-04-29 DIAGNOSIS — Z79899 Other long term (current) drug therapy: Secondary | ICD-10-CM | POA: Diagnosis not present

## 2020-04-29 DIAGNOSIS — Z8719 Personal history of other diseases of the digestive system: Secondary | ICD-10-CM | POA: Diagnosis not present

## 2020-04-29 DIAGNOSIS — D122 Benign neoplasm of ascending colon: Secondary | ICD-10-CM | POA: Insufficient documentation

## 2020-04-29 DIAGNOSIS — Z881 Allergy status to other antibiotic agents status: Secondary | ICD-10-CM | POA: Insufficient documentation

## 2020-04-29 HISTORY — PX: POLYPECTOMY: SHX5525

## 2020-04-29 HISTORY — PX: COLONOSCOPY WITH PROPOFOL: SHX5780

## 2020-04-29 SURGERY — COLONOSCOPY WITH PROPOFOL
Anesthesia: General

## 2020-04-29 MED ORDER — LACTATED RINGERS IV SOLN: INTRAVENOUS | Status: DC | PRN

## 2020-04-29 MED ORDER — CHLORHEXIDINE GLUCONATE CLOTH 2 % EX PADS: 6.0000 | MEDICATED_PAD | Freq: Once | CUTANEOUS | Status: DC

## 2020-04-29 MED ORDER — LACTATED RINGERS IV SOLN: Freq: Once | INTRAVENOUS | Status: AC

## 2020-04-29 MED ORDER — PROPOFOL 10 MG/ML IV BOLUS
INTRAVENOUS | Status: DC | PRN
  Administered 2020-04-29: 100 mg via INTRAVENOUS

## 2020-04-29 MED ORDER — LIDOCAINE HCL (CARDIAC) PF 100 MG/5ML IV SOSY
PREFILLED_SYRINGE | INTRAVENOUS | Status: DC | PRN
  Administered 2020-04-29: 50 mg via INTRAVENOUS

## 2020-04-29 MED ORDER — PROPOFOL 500 MG/50ML IV EMUL
INTRAVENOUS | Status: DC | PRN
  Administered 2020-04-29: 100 ug/kg/min via INTRAVENOUS

## 2020-04-29 NOTE — H&P (Signed)
Primary Care Physician:  Kathyrn Drown, MD Primary Gastroenterologist:  Dr. Abbey Chatters  Pre-Procedure History & Physical: HPI:  Steven Glover is a 69 y.o. male is here for a colonoscopy to be performed for surveillance purposes due to personal history of adenomatous colon polyps. Last CLN 3 years ago with 4 polyps removed. Patient denies any family history of colorectal cancer.  No melena or hematochezia.  No abdominal pain or unintentional weight loss.  No change in bowel habits.  Overall feels well from a GI standpoint.  Past Medical History:  Diagnosis Date  . Acid reflux disease   . Allergy   . Asthma    as child  . BMI between 19-24,adult JUL 2012 191.8 LBS  . Collagen vascular disease ([place])   . GERD (gastroesophageal reflux disease)   . Internal hemorrhoids with other complication    TCS DEC 6295  . Pulmonary nodule 2013   [facility] - Repeat 08/2012  . Rheumatoid arthritis(714.0)     Past Surgical History:  Procedure Laterality Date  . COLONOSCOPY  2009 NUR [place]   MULTIPLE SIMPLE ADENOMAS  . COLONOSCOPY N/A 05/02/2017   Dr. Oneida Alar: 4 simple adenomas removed, diverticulosis.  Next colonoscopy in 3 years.  . COLONOSCOPY W/ POLYPECTOMY  2010 NUR  . COLONOSCOPY WITH PROPOFOL  05/15/2012   SLF:Two sessile polyps ranging between 3-81mm in size were found in the ascending colon and rectum; multiple biopsies were performed/Mild diverticulosis was noted in the sigmoid colon/The colon mucosa was otherwise normal/ Moderate sized internal hemorrhoids. TCS 04/2017.  Marland Kitchen ESOPHAGOGASTRODUODENOSCOPY  01/01/2011 [id number] CHRONIC GASTRITIS   SLF: Hiatal Hernia/mild gastritis/stricture in the distal esophagus  . FOOT SURGERY    . POLYPECTOMY  05/15/2012   SIMPLE ADENOMA(2)  . SAVORY DILATION  01/01/2011   Procedure: SAVORY DILATION;  Surgeon: Dorothyann Peng, MD;  Location: [facility];  Service: Endoscopy;  Laterality: N/A;  . SHOULDER OPEN ROTATOR CUFF REPAIR  2005  . WRIST FUSION      Prior  to Admission medications   Medication Sig Start Date End Date Taking? Authorizing Provider  etanercept (ENBREL) 50 MG/ML injection Inject 50 mg into the skin every Monday.    Yes [provider]  ezetimibe (ZETIA) 10 MG tablet Take 1 tablet by mouth once daily Patient taking differently: Take 10 mg by mouth daily.  03/12/20  Yes Kathyrn Drown, MD  fexofenadine (ALLERGY RELIEF) 180 MG tablet Take 180 mg by mouth daily as needed for allergies.   Yes [provider]  folic acid (FOLVITE) 1 MG tablet Take 1 mg by mouth daily.     Yes [provider]  methotrexate 2.5 MG tablet Take 17.5 mg by mouth every Monday. 7 tablets-Monday   Yes [provider]  omeprazole (PRILOSEC) 20 MG capsule Take 1 capsule (20 mg total) by mouth daily before breakfast. 08/08/18  Yes Mahala Menghini, PA-C  cetirizine (ZYRTEC) 5 MG tablet Take 1 tablet (5 mg total) by mouth daily. Patient not taking: Reported on 04/22/2020 10/13/19   Emerson Monte, FNP    Allergies as of 03/05/2020 - Review Complete 03/05/2020  Allergen Reaction Noted  . Cefprozil  02/21/2015  . Statins  11/30/2018    Family History  Problem Relation Age of Onset  . Diabetes Father   . Cancer Sister        breast  . Colon cancer Neg Hx   . Colon polyps Neg Hx     Social History  Socioeconomic History  . Marital status: Married    Spouse name: Not on file  . Number of children: Not on file  . Years of education: Not on file  . Highest education level: Not on file  Occupational History  . Not on file  Tobacco Use  . Smoking status: Never Smoker  . Smokeless tobacco: Never Used  Vaping Use  . Vaping Use: Never used  Substance and Sexual Activity  . Alcohol use: No  . Drug use: No  . Sexual activity: Yes    Birth control/protection: None  Other Topics Concern  . Not on file  Social History Narrative   [place] (ED [facility]), 2 CHILDREN   Social Determinants of Health    Financial Resource Strain:   . Difficulty of Paying Living Expenses: Not on file  Food Insecurity:   . Worried About Charity fundraiser in the Last Year: Not on file  . Ran Out of Food in the Last Year: Not on file  Transportation Needs:   . Lack of Transportation (Medical): Not on file  . Lack of Transportation (Non-Medical): Not on file  Physical Activity:   . Days of Exercise per Week: Not on file  . Minutes of Exercise per Session: Not on file  Stress:   . Feeling of Stress : Not on file  Social Connections:   . Frequency of Communication with Friends and Family: Not on file  . Frequency of Social Gatherings with Friends and Family: Not on file  . Attends Religious Services: Not on file  . Active Member of Clubs or Organizations: Not on file  . Attends Archivist Meetings: Not on file  . Marital Status: Not on file  Intimate Partner Violence:   . Fear of Current or Ex-Partner: Not on file  . Emotionally Abused: Not on file  . Physically Abused: Not on file  . Sexually Abused: Not on file    Review of Systems: See HPI, otherwise negative ROS  Impression/Plan: Steven Glover is here for a colonoscopy to be performed for surveillance purposes due to personal history of adenomatous colon polyps.  The risks of the procedure including infection, bleed, or perforation as well as benefits, limitations, alternatives and imponderables have been reviewed with the patient. Questions have been answered. All parties agreeable.

## 2020-04-29 NOTE — Op Note (Signed)
[facility] Patient Name: Steven Glover Procedure Date: 04/29/2020 9:45 AM MRN: [id number] Date of Birth: 15-Oct-1950 Attending MD: Elon Alas. Edgar Frisk CSN: [id number] Age: 69 Admit Type: Outpatient Procedure:                Colonoscopy Indications:              High risk colon cancer surveillance: Personal                            history of colonic polyps Providers:                Elon Alas. Jaicee Michelotti, DO, Otis Peak B. Sharon Seller, RN,                            Caprice Kluver, Aram Candela Referring MD:              Medicines:                See the Anesthesia note for documentation of the                            administered medications Complications:            No immediate complications. Estimated Blood Loss:     Estimated blood loss was minimal. Procedure:                Pre-Anesthesia Assessment:                           - The anesthesia plan was to use monitored                            anesthesia care (MAC).                           After obtaining informed consent, the colonoscope                            was passed under direct vision. Throughout the                            procedure, the patient's blood pressure, pulse, and                            oxygen saturations were monitored continuously. The                            [id number]-[id number]) was introduced through the anus                            and advanced to the the cecum, identified by                            appendiceal orifice and ileocecal valve. The                            colonoscopy was performed without difficulty. The  patient tolerated the procedure well. The quality                            of the bowel preparation was evaluated using the                            BBPS [facility] Bowel Preparation Scale) with scores                            of: Right Colon = 2 (minor amount of residual                            staining, small fragments of stool and/or  opaque                            liquid, but mucosa seen well), Transverse Colon = 3                            (entire mucosa seen well with no residual staining,                            small fragments of stool or opaque liquid) and Left                            Colon = 3 (entire mucosa seen well with no residual                            staining, small fragments of stool or opaque                            liquid). The total BBPS score equals 8. The quality                            of the bowel preparation was good. Scope In: 10:04:57 AM Scope Out: 10:20:38 AM Scope Withdrawal Time: 0 hours 11 minutes 51 seconds  Total Procedure Duration: 0 hours 15 minutes 41 seconds  Findings:      The perianal and digital rectal examinations were normal.      Non-bleeding internal hemorrhoids were found during endoscopy.      Many small and large-mouthed diverticula were found in the sigmoid colon       and descending colon.      Two sessile polyps were found in the transverse colon and ascending       colon. The polyps were 3 to 5 mm in size. These polyps were removed with       a cold snare. Resection and retrieval were complete.      A 2 mm polyp was found in the transverse colon. The polyp was sessile.       The polyp was removed with a cold biopsy forceps. Resection and       retrieval were complete. Impression:               - Preparation of the colon was fair.                           -  Non-bleeding internal hemorrhoids.                           - Diverticulosis in the sigmoid colon and in the                            descending colon.                           - Two 3 to 5 mm polyps in the transverse colon and                            in the ascending colon, removed with a cold snare.                            Resected and retrieved.                           - One 2 mm polyp in the transverse colon, removed                            with a cold biopsy forceps. Resected  and retrieved. Moderate Sedation:      Per Anesthesia Care Recommendation:           - Patient has a contact number available for                            emergencies. The signs and symptoms of potential                            delayed complications were discussed with the                            patient. Return to normal activities tomorrow.                            Written discharge instructions were provided to the                            patient.                           - Resume previous diet.                           - Continue present medications.                           - Await pathology results.                           - Repeat colonoscopy in 5 years for surveillance.                           - Return to GI clinic PRN. Procedure Code(s):        ---  Professional ---                           [id number], Colonoscopy, flexible; with removal of                            tumor(s), polyp(s), or other lesion(s) by snare                            technique                           45380, 59, Colonoscopy, flexible; with biopsy,                            single or multiple Diagnosis Code(s):        --- Professional ---                           K63.5, Polyp of colon                           Z86.010, Personal history of colonic polyps                           K64.8, Other hemorrhoids                           K57.30, Diverticulosis of large intestine without                            perforation or abscess without bleeding CPT copyright 2019 American Medical Association. All rights reserved. The codes documented in this report are preliminary and upon coder review may  be revised to meet current compliance requirements. Elon Alas. Abbey Chatters, DO [place] Abbey Chatters, DO 04/29/2020 10:24:58 AM This report has been signed electronically. Number of Addenda: 0

## 2020-04-29 NOTE — Discharge Instructions (Signed)
PATIENT INSTRUCTIONS POST-ANESTHESIA  IMMEDIATELY FOLLOWING SURGERY:  Do not drive or operate machinery for the first twenty four hours after surgery.  Do not make any important decisions for twenty four hours after surgery or while taking narcotic pain medications or sedatives.  If you develop intractable nausea and vomiting or a severe headache please notify your doctor immediately.  FOLLOW-UP:  Please make an appointment with your surgeon as instructed. You do not need to follow up with anesthesia unless specifically instructed to do so.  WOUND CARE INSTRUCTIONS (if applicable):  Keep a dry clean dressing on the anesthesia/puncture wound site if there is drainage.  Once the wound has quit draining you may leave it open to air.  Generally you should leave the bandage intact for twenty four hours unless there is drainage.  If the epidural site drains for more than 36-48 hours please call the anesthesia department.  QUESTIONS?:  Please feel free to call your physician or the hospital operator if you have any questions, and they will be happy to assist you.        Colonoscopy, Adult, Care After This sheet gives you information about how to care for yourself after your procedure. Your health care provider may also give you more specific instructions. If you have problems or questions, contact your health care provider. What can I expect after the procedure? After the procedure, it is common to have:  A small amount of blood in your stool for 24 hours after the procedure.  Some gas.  Mild cramping or bloating of your abdomen. Follow these instructions at home: Eating and drinking   Drink enough fluid to keep your urine pale yellow.  Follow instructions from your health care provider about eating or drinking restrictions.  Resume your normal diet as instructed by your health care provider. Avoid heavy or fried foods that are hard to digest. Activity  Rest as told by your health care  provider.  Avoid sitting for a long time without moving. Get up to take short walks every 1-2 hours. This is important to improve blood flow and breathing. Ask for help if you feel weak or unsteady.  Return to your normal activities as told by your health care provider. Ask your health care provider what activities are safe for you. Managing cramping and bloating   Try walking around when you have cramps or feel bloated.  Apply heat to your abdomen as told by your health care provider. Use the heat source that your health care provider recommends, such as a moist heat pack or a heating pad. ? Place a towel between your skin and the heat source. ? Leave the heat on for 20-30 minutes. ? Remove the heat if your skin turns bright red. This is especially important if you are unable to feel pain, heat, or cold. You may have a greater risk of getting burned. General instructions  For the first 24 hours after the procedure: ? Do not drive or use machinery. ? Do not sign important documents. ? Do not drink alcohol. ? Do your regular daily activities at a slower pace than normal. ? Eat soft foods that are easy to digest.  Take over-the-counter and prescription medicines only as told by your health care provider.  Keep all follow-up visits as told by your health care provider. This is important. Contact a health care provider if:  You have blood in your stool 2-3 days after the procedure. Get help right away if you have:  More than a small spotting of blood in your stool.  Large blood clots in your stool.  Swelling of your abdomen.  Nausea or vomiting.  A fever.  Increasing pain in your abdomen that is not relieved with medicine. Summary  After the procedure, it is common to have a small amount of blood in your stool. You may also have mild cramping and bloating of your abdomen.  For the first 24 hours after the procedure, do not drive or use machinery, sign important documents, or  drink alcohol.  Get help right away if you have a lot of blood in your stool, nausea or vomiting, a fever, or increased pain in your abdomen. This information is not intended to replace advice given to you by your health care provider. Make sure you discuss any questions you have with your health care provider. Document Revised: 12/04/2018 Document Reviewed: 12/04/2018 Elsevier Patient Education  [place].   Colon Polyps  Polyps are tissue growths inside the body. Polyps can grow in many places, including the large intestine (colon). A polyp may be a round bump or a mushroom-shaped growth. You could have one polyp or several. Most colon polyps are noncancerous (benign). However, some colon polyps can become cancerous over time. Finding and removing the polyps early can help prevent this. What are the causes? The exact cause of colon polyps is not known. What increases the risk? You are more likely to develop this condition if you:  Have a family history of colon cancer or colon polyps.  Are older than 96 or older than 45 if you are African American.  Have inflammatory bowel disease, such as ulcerative colitis or Crohn's disease.  Have certain hereditary conditions, such as: ? Familial adenomatous polyposis. ? Lynch syndrome. ? Turcot syndrome. ? Peutz-Jeghers syndrome.  Are overweight.  Smoke cigarettes.  Do not get enough exercise.  Drink too much alcohol.  Eat a diet that is high in fat and red meat and low in fiber.  Had childhood cancer that was treated with abdominal radiation. What are the signs or symptoms? Most polyps do not cause symptoms. If you have symptoms, they may include:  Blood coming from your rectum when having a bowel movement.  Blood in your stool. The stool may look dark red or black.  Abdominal pain.  A change in bowel habits, such as constipation or diarrhea. How is this diagnosed? This condition is diagnosed with a colonoscopy.  This is a procedure in which a lighted, flexible scope is inserted into the anus and then passed into the colon to examine the area. Polyps are sometimes found when a colonoscopy is done as part of routine cancer screening tests. How is this treated? Treatment for this condition involves removing any polyps that are found. Most polyps can be removed during a colonoscopy. Those polyps will then be tested for cancer. Additional treatment may be needed depending on the results of testing. Follow these instructions at home: Lifestyle  Maintain a healthy weight, or lose weight if recommended by your health care provider.  Exercise every day or as told by your health care provider.  Do not use any products that contain nicotine or tobacco, such as cigarettes and e-cigarettes. If you need help quitting, ask your health care provider.  If you drink alcohol, limit how much you have: ? 0-1 drink a day for women. ? 0-2 drinks a day for men.  Be aware of how much alcohol is in your drink. In  the [place] one drink equals one 12 oz bottle of beer (355 mL), one 5 oz glass of wine (148 mL), or one 1 oz shot of hard liquor (44 mL). Eating and drinking   Eat foods that are high in fiber, such as fruits, vegetables, and whole grains.  Eat foods that are high in calcium and vitamin D, such as milk, cheese, yogurt, eggs, liver, fish, and broccoli.  Limit foods that are high in fat, such as fried foods and desserts.  Limit the amount of red meat and processed meat you eat, such as hot dogs, sausage, bacon, and lunch meats. General instructions  Keep all follow-up visits as told by your health care provider. This is important. ? This includes having regularly scheduled colonoscopies. ? Talk to your health care provider about when you need a colonoscopy. Contact a health care provider if:  You have new or worsening bleeding during a bowel movement.  You have new or increased blood in your stool.  You  have a change in bowel habits.  You lose weight for no known reason. Summary  Polyps are tissue growths inside the body. Polyps can grow in many places, including the colon.  Most colon polyps are noncancerous (benign), but some can become cancerous over time.  This condition is diagnosed with a colonoscopy.  Treatment for this condition involves removing any polyps that are found. Most polyps can be removed during a colonoscopy. This information is not intended to replace advice given to you by your health care provider. Make sure you discuss any questions you have with your health care provider. Document Revised: 08/25/2017 Document Reviewed: 08/25/2017 Elsevier Patient Education  [place].  Hemorrhoids Hemorrhoids are swollen veins in and around the rectum or anus. There are two types of hemorrhoids:  Internal hemorrhoids. These occur in the veins that are just inside the rectum. They may poke through to the outside and become irritated and painful.  External hemorrhoids. These occur in the veins that are outside the anus and can be felt as a painful swelling or hard lump near the anus. Most hemorrhoids do not cause serious problems, and they can be managed with home treatments such as diet and lifestyle changes. If home treatments do not help the symptoms, procedures can be done to shrink or remove the hemorrhoids. What are the causes? This condition is caused by increased pressure in the anal area. This pressure may result from various things, including:  Constipation.  Straining to have a bowel movement.  Diarrhea.  Pregnancy.  Obesity.  Sitting for long periods of time.  Heavy lifting or other activity that causes you to strain.  Anal sex.  Riding a bike for a long period of time. What are the signs or symptoms? Symptoms of this condition include:  Pain.  Anal itching or irritation.  Rectal bleeding.  Leakage of stool (feces).  Anal  swelling.  One or more lumps around the anus. How is this diagnosed? This condition can often be diagnosed through a visual exam. Other exams or tests may also be done, such as:  An exam that involves feeling the rectal area with a gloved hand (digital rectal exam).  An exam of the anal canal that is done using a small tube (anoscope).  A blood test, if you have lost a significant amount of blood.  A test to look inside the colon using a flexible tube with a camera on the end (sigmoidoscopy or colonoscopy). How is this treated?  This condition can usually be treated at home. However, various procedures may be done if dietary changes, lifestyle changes, and other home treatments do not help your symptoms. These procedures can help make the hemorrhoids smaller or remove them completely. Some of these procedures involve surgery, and others do not. Common procedures include:  Rubber band ligation. Rubber bands are placed at the base of the hemorrhoids to cut off their blood supply.  Sclerotherapy. Medicine is injected into the hemorrhoids to shrink them.  Infrared coagulation. A type of light energy is used to get rid of the hemorrhoids.  Hemorrhoidectomy surgery. The hemorrhoids are surgically removed, and the veins that supply them are tied off.  Stapled hemorrhoidopexy surgery. The surgeon staples the base of the hemorrhoid to the rectal wall. Follow these instructions at home: Eating and drinking   Eat foods that have a lot of fiber in them, such as whole grains, beans, nuts, fruits, and vegetables.  Ask your health care provider about taking products that have added fiber (fiber supplements).  Reduce the amount of fat in your diet. You can do this by eating low-fat dairy products, eating less red meat, and avoiding processed foods.  Drink enough fluid to keep your urine pale yellow. Managing pain and swelling   Take warm sitz baths for 20 minutes, 3-4 times a day to ease pain  and discomfort. You may do this in a bathtub or using a portable sitz bath that fits over the toilet.  If directed, apply ice to the affected area. Using ice packs between sitz baths may be helpful. ? Put ice in a plastic bag. ? Place a towel between your skin and the bag. ? Leave the ice on for 20 minutes, 2-3 times a day. General instructions  Take over-the-counter and prescription medicines only as told by your health care provider.  Use medicated creams or suppositories as told.  Get regular exercise. Ask your health care provider how much and what kind of exercise is best for you. In general, you should do moderate exercise for at least 30 minutes on most days of the week (150 minutes each week). This can include activities such as walking, biking, or yoga.  Go to the bathroom when you have the urge to have a bowel movement. Do not wait.  Avoid straining to have bowel movements.  Keep the anal area dry and clean. Use wet toilet paper or moist towelettes after a bowel movement.  Do not sit on the toilet for long periods of time. This increases blood pooling and pain.  Keep all follow-up visits as told by your health care provider. This is important. Contact a health care provider if you have:  Increasing pain and swelling that are not controlled by treatment or medicine.  Difficulty having a bowel movement, or you are unable to have a bowel movement.  Pain or inflammation outside the area of the hemorrhoids. Get help right away if you have:  Uncontrolled bleeding from your rectum. Summary  Hemorrhoids are swollen veins in and around the rectum or anus.  Most hemorrhoids can be managed with home treatments such as diet and lifestyle changes.  Taking warm sitz baths can help ease pain and discomfort.  In severe cases, procedures or surgery can be done to shrink or remove the hemorrhoids. This information is not intended to replace advice given to you by your health care  provider. Make sure you discuss any questions you have with your health care provider. Document Revised:  10/06/2018 Document Reviewed: 09/29/2017 Elsevier Patient Education  [place].  Diverticulosis  Diverticulosis is a condition that develops when small pouches (diverticula) form in the wall of the large intestine (colon). The colon is where water is absorbed and stool (feces) is formed. The pouches form when the inside layer of the colon pushes through weak spots in the outer layers of the colon. You may have a few pouches or many of them. The pouches usually do not cause problems unless they become inflamed or infected. When this happens, the condition is called diverticulitis. What are the causes? The cause of this condition is not known. What increases the risk? The following factors may make you more likely to develop this condition:  Being older than age 47. Your risk for this condition increases with age. Diverticulosis is rare among people younger than age 42. By age 12, many people have it.  Eating a low-fiber diet.  Having frequent constipation.  Being overweight.  Not getting enough exercise.  Smoking.  Taking over-the-counter pain medicines, like aspirin and ibuprofen.  Having a family history of diverticulosis. What are the signs or symptoms? In most people, there are no symptoms of this condition. If you do have symptoms, they may include:  Bloating.  Cramps in the abdomen.  Constipation or diarrhea.  Pain in the lower left side of the abdomen. How is this diagnosed? Because diverticulosis usually has no symptoms, it is most often diagnosed during an exam for other colon problems. The condition may be diagnosed by:  Using a flexible scope to examine the colon (colonoscopy).  Taking an X-ray of the colon after dye has been put into the colon (barium enema).  Having a CT scan. How is this treated? You may not need treatment for this condition.  Your health care provider may recommend treatment to prevent problems. You may need treatment if you have symptoms or if you previously had diverticulitis. Treatment may include:  Eating a high-fiber diet.  Taking a fiber supplement.  Taking a live bacteria supplement (probiotic).  Taking medicine to relax your colon. Follow these instructions at home: Medicines  Take over-the-counter and prescription medicines only as told by your health care provider.  If told by your health care provider, take a fiber supplement or probiotic. Constipation prevention Your condition may cause constipation. To prevent or treat constipation, you may need to:  Drink enough fluid to keep your urine pale yellow.  Take over-the-counter or prescription medicines.  Eat foods that are high in fiber, such as beans, whole grains, and fresh fruits and vegetables.  Limit foods that are high in fat and processed sugars, such as fried or sweet foods.  General instructions  Try not to strain when you have a bowel movement.  Keep all follow-up visits as told by your health care provider. This is important. Contact a health care provider if you:  Have pain in your abdomen.  Have bloating.  Have cramps.  Have not had a bowel movement in 3 days. Get help right away if:  Your pain gets worse.  Your bloating becomes very bad.  You have a fever or chills, and your symptoms suddenly get worse.  You vomit.  You have bowel movements that are bloody or black.  You have bleeding from your rectum. Summary  Diverticulosis is a condition that develops when small pouches (diverticula) form in the wall of the large intestine (colon).  You may have a few pouches or many of them.  This condition is most often diagnosed during an exam for other colon problems.  Treatment may include increasing the fiber in your diet, taking supplements, or taking medicines. This information is not intended to replace  advice given to you by your health care provider. Make sure you discuss any questions you have with your health care provider. Document Revised: 12/07/2018 Document Reviewed: 12/07/2018 Elsevier Patient Education  [place].

## 2020-04-29 NOTE — Anesthesia Procedure Notes (Signed)
Date/Time: 04/29/2020 10:07 AM Performed by: Orlie Dakin, CRNA Pre-anesthesia Checklist: Patient identified, Emergency Drugs available, Suction available and Patient being monitored Patient Re-evaluated:Patient Re-evaluated prior to induction Oxygen Delivery Method: Nasal cannula Induction Type: IV induction Placement Confirmation: positive ETCO2

## 2020-04-29 NOTE — Anesthesia Preprocedure Evaluation (Signed)
Anesthesia Evaluation  Patient identified by MRN, date of birth, ID band Patient awake    Reviewed: Allergy & Precautions, NPO status , Patient's Chart, lab work & pertinent test results  History of Anesthesia Complications Negative for: history of anesthetic complications  Airway Mallampati: II  TM Distance: >3 FB Neck ROM: Full    Dental  (+) Edentulous Upper, Edentulous Lower   Pulmonary asthma ,    Pulmonary exam normal breath sounds clear to auscultation       Cardiovascular Exercise Tolerance: Good Normal cardiovascular exam Rhythm:Regular Rate:Normal     Neuro/Psych negative neurological ROS  negative psych ROS   GI/Hepatic Neg liver ROS, GERD  Medicated and Controlled,  Endo/Other  negative endocrine ROS  Renal/GU negative Renal ROS     Musculoskeletal  (+) Arthritis , Rheumatoid disorders,    Abdominal   Peds  Hematology negative hematology ROS (+)   Anesthesia Other Findings   Reproductive/Obstetrics negative OB ROS                             Anesthesia Physical Anesthesia Plan  ASA: II  Anesthesia Plan: General   Post-op Pain Management:    Induction: Intravenous  PONV Risk Score and Plan: TIVA  Airway Management Planned: Nasal Cannula and Natural Airway  Additional Equipment:   Intra-op Plan:   Post-operative Plan:   Informed Consent: I have reviewed the patients History and Physical, chart, labs and discussed the procedure including the risks, benefits and alternatives for the proposed anesthesia with the patient or authorized representative who has indicated his/her understanding and acceptance.       Plan Discussed with: CRNA and Surgeon  Anesthesia Plan Comments:         Anesthesia Quick Evaluation

## 2020-04-29 NOTE — Anesthesia Postprocedure Evaluation (Signed)
Anesthesia Post Note  Patient: Steven Glover  Procedure(s) Performed: COLONOSCOPY WITH PROPOFOL (N/A ) POLYPECTOMY  Patient location during evaluation: PACU Anesthesia Type: General Level of consciousness: awake and alert and oriented Pain management: pain level controlled Vital Signs Assessment: post-procedure vital signs reviewed and stable Respiratory status: spontaneous breathing, nonlabored ventilation and respiratory function stable Cardiovascular status: blood pressure returned to baseline and stable Postop Assessment: no apparent nausea or vomiting Anesthetic complications: no   No complications documented.   Last Vitals:  Vitals:   04/29/20 1030 04/29/20 1044  BP: 123/76 112/77  Pulse: 72 60  Resp: 13 14  Temp:  36.7 C  SpO2: 98% 98%    Last Pain:  Vitals:   04/29/20 1044  TempSrc: Oral  PainSc: 0-No pain                 Orlie Dakin

## 2020-04-29 NOTE — Transfer of Care (Signed)
Immediate Anesthesia Transfer of Care Note  Patient: Steven Glover  Procedure(s) Performed: COLONOSCOPY WITH PROPOFOL (N/A ) POLYPECTOMY  Patient Location: PACU  Anesthesia Type:General  Level of Consciousness: awake, alert  and oriented  Airway & Oxygen Therapy: Patient Spontanous Breathing  Post-op Assessment: Report given to RN and Post -op Vital signs reviewed and stable  Post vital signs: Reviewed and stable  Last Vitals:  Vitals Value Taken Time  BP 120/81 04/29/20 1025  Temp    Pulse 73 04/29/20 1026  Resp 14 04/29/20 1026  SpO2 96 % 04/29/20 1026  Vitals shown include unvalidated device data.  Last Pain:  Vitals:   04/29/20 1003  TempSrc:   PainSc: 0-No pain      Patients Stated Pain Goal: 8 (58/31/67 4255)  Complications: No complications documented.

## 2020-04-30 LAB — SURGICAL PATHOLOGY

## 2020-05-05 ENCOUNTER — Encounter (HOSPITAL_COMMUNITY): Payer: Self-pay | Admitting: Internal Medicine

## 2020-05-12 NOTE — Progress Notes (Signed)
ON RECALL  °

## 2020-05-21 LAB — HEPATIC FUNCTION PANEL
ALT: 22 IU/L (ref 0–44)
AST: 24 IU/L (ref 0–40)
Albumin: 4.2 g/dL (ref 3.7–4.7)
Alkaline Phosphatase: 90 IU/L (ref 44–121)
Bilirubin Total: 0.5 mg/dL (ref 0.0–1.2)
Bilirubin, Direct: 0.13 mg/dL (ref 0.00–0.40)
Total Protein: 6.4 g/dL (ref 6.0–8.5)

## 2020-05-21 LAB — BASIC METABOLIC PANEL
BUN/Creatinine Ratio: 18 (ref 10–24)
BUN: 19 mg/dL (ref 8–27)
CO2: 22 mmol/L (ref 20–29)
Calcium: 9.1 mg/dL (ref 8.6–10.2)
Chloride: 105 mmol/L (ref 96–106)
Creatinine, Ser: 1.08 mg/dL (ref 0.76–1.27)
GFR calc Af Amer: 79 mL/min/{1.73_m2} (ref >59)
GFR calc non Af Amer: 69 mL/min/{1.73_m2} (ref >59)
Glucose: 97 mg/dL (ref 65–99)
Potassium: 4.2 mmol/L (ref 3.5–5.2)
Sodium: 138 mmol/L (ref 134–144)

## 2020-05-21 LAB — PSA: Prostate Specific Ag, Serum: 1.9 ng/mL (ref 0.0–4.0)

## 2020-05-21 LAB — LIPID PANEL
Chol/HDL Ratio: 4.4 ratio (ref 0.0–5.0)
Cholesterol, Total: 137 mg/dL (ref 100–199)
HDL: 31 mg/dL — ABNORMAL LOW (ref >39)
LDL Chol Calc (NIH): 68 mg/dL (ref 0–99)
Triglycerides: 231 mg/dL — ABNORMAL HIGH (ref 0–149)
VLDL Cholesterol Cal: 38 mg/dL (ref 5–40)

## 2020-06-11 ENCOUNTER — Ambulatory Visit (INDEPENDENT_AMBULATORY_CARE_PROVIDER_SITE_OTHER): Payer: Managed Care, Other (non HMO) | Admitting: Family Medicine

## 2020-06-11 ENCOUNTER — Encounter: Payer: Self-pay | Admitting: Family Medicine

## 2020-06-11 ENCOUNTER — Other Ambulatory Visit: Payer: Self-pay

## 2020-06-11 VITALS — BP 118/76 | HR 74 | Temp 97.5°F | Wt 208.8 lb

## 2020-06-11 DIAGNOSIS — Z0000 Encounter for general adult medical examination without abnormal findings: Secondary | ICD-10-CM

## 2020-06-11 DIAGNOSIS — E785 Hyperlipidemia, unspecified: Secondary | ICD-10-CM

## 2020-06-11 MED ORDER — EZETIMIBE 10 MG PO TABS
10.0000 mg | ORAL_TABLET | Freq: Every day | ORAL | 3 refills | Status: DC
Start: 2020-06-11 — End: 2021-06-12

## 2020-06-11 NOTE — Progress Notes (Signed)
   Subjective:    Patient ID: Steven Glover, male    DOB: Sep 03, 1950, 70 y.o.   MRN: [id number]  HPI The patient comes in today for a wellness visit.  Fall Risk  06/11/2020 06/11/2019 06/11/2019 06/06/2018 03/03/2017  Falls in the past year? 0 0 0 1 No  Follow up Falls evaluation completed Falls evaluation completed - - -   [place] Office Visit from 06/11/2020 in [place]  PHQ-2 Total Score 0       A review of their health history was completed.  A review of medications was also completed.  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   Additional concerns: none  Review of Systems  Constitutional: Negative for diaphoresis and fatigue.  HENT: Negative for congestion and rhinorrhea.   Respiratory: Negative for cough and shortness of breath.   Cardiovascular: Negative for chest pain and leg swelling.  Gastrointestinal: Negative for abdominal pain and diarrhea.  Musculoskeletal: Positive for arthralgias.  Skin: Negative for color change and rash.  Neurological: Negative for dizziness and headaches.  Psychiatric/Behavioral: Negative for behavioral problems and confusion.       Objective:   Physical Exam Vitals reviewed.  Constitutional:      General: He is not in acute distress.    Appearance: He is well-nourished.  HENT:     Head: Normocephalic and atraumatic.  Eyes:     General:        Right eye: No discharge.        Left eye: No discharge.  Neck:     Trachea: No tracheal deviation.  Cardiovascular:     Rate and Rhythm: Normal rate and regular rhythm.     Heart sounds: Normal heart sounds. No murmur heard.   Pulmonary:     Effort: Pulmonary effort is normal. No respiratory distress.     Breath sounds: Normal breath sounds.   Musculoskeletal:        General: No edema.  Lymphadenopathy:     Cervical: No cervical adenopathy.  Skin:    General: Skin is warm and dry.  Neurological:     Mental Status: He is alert.     Coordination: Coordination normal.  Psychiatric:        Mood and Affect: Mood and affect normal.        Behavior: Behavior normal.    Prostate exam normal  I have encouraged patient to get the booster of COVID-vaccine     Assessment & Plan:  Adult wellness-complete.wellness physical was conducted today. Importance of diet and exercise were discussed in detail.  In addition to this a discussion regarding safety was also covered. We also reviewed over immunizations and gave recommendations regarding current immunization needed for age.  In addition to this additional areas were also touched on including: Preventative health exams needed:  Colonoscopy up-to-date next 06/12/2024  Patient was advised yearly wellness exam   Hyperlipidemia does not tolerate statins on Zetia currently lab work looks very good  Follow-up in 1 year

## 2021-03-26 ENCOUNTER — Other Ambulatory Visit (INDEPENDENT_AMBULATORY_CARE_PROVIDER_SITE_OTHER): Payer: Managed Care, Other (non HMO) | Admitting: *Deleted

## 2021-03-26 ENCOUNTER — Other Ambulatory Visit: Payer: Self-pay

## 2021-03-26 DIAGNOSIS — Z23 Encounter for immunization: Secondary | ICD-10-CM

## 2021-05-01 ENCOUNTER — Telehealth: Payer: Self-pay | Admitting: Family Medicine

## 2021-05-01 NOTE — Telephone Encounter (Signed)
Patient dropped off paperwork from [facility] about prescriptions that he will need to have a PA on. I have placed in nurses area.  CB# [phone]

## 2021-05-08 ENCOUNTER — Other Ambulatory Visit: Payer: Self-pay

## 2021-05-08 ENCOUNTER — Telehealth: Payer: Self-pay | Admitting: Family Medicine

## 2021-05-08 DIAGNOSIS — Z0000 Encounter for general adult medical examination without abnormal findings: Secondary | ICD-10-CM

## 2021-05-08 DIAGNOSIS — E785 Hyperlipidemia, unspecified: Secondary | ICD-10-CM

## 2021-05-08 DIAGNOSIS — E781 Pure hyperglyceridemia: Secondary | ICD-10-CM

## 2021-05-08 DIAGNOSIS — Z125 Encounter for screening for malignant neoplasm of prostate: Secondary | ICD-10-CM

## 2021-05-08 NOTE — Telephone Encounter (Signed)
Patient has been informed per drs recommendations regarding ezetimibe lab orders placed. Appt scheduled for wellness.

## 2021-05-08 NOTE — Telephone Encounter (Signed)
Pt insurance is changing and will no longer cover ezetimibe 10 mg tablet. Insurance states pt can be switched to Rosuvastatin, Simvastatin if ok with provider. Paper in provider office for review. Please advise. Thank you.

## 2021-05-08 NOTE — Telephone Encounter (Signed)
Lab orders placed in the system and for pick up .

## 2021-05-08 NOTE — Telephone Encounter (Signed)
1.  We will work on this over the next few days. #2 he is due for his standard blood work He is also due for a wellness exam in January I recommend the patient schedule because we booked up weeks ahead of time Lipid, liver, metabolic 7, PSA

## 2021-05-08 NOTE — Telephone Encounter (Signed)
Patient has physical on 1/20 and requesting labs

## 2021-06-04 LAB — BASIC METABOLIC PANEL
BUN/Creatinine Ratio: 15 (ref 10–24)
BUN: 17 mg/dL (ref 8–27)
CO2: 23 mmol/L (ref 20–29)
Calcium: 9.4 mg/dL (ref 8.6–10.2)
Chloride: 105 mmol/L (ref 96–106)
Creatinine, Ser: 1.13 mg/dL (ref 0.76–1.27)
Glucose: 83 mg/dL (ref 70–99)
Potassium: 4.1 mmol/L (ref 3.5–5.2)
Sodium: 141 mmol/L (ref 134–144)
eGFR: 69 mL/min/{1.73_m2} (ref >59)

## 2021-06-04 LAB — LIPID PANEL
Chol/HDL Ratio: 4.2 ratio (ref 0.0–5.0)
Cholesterol, Total: 127 mg/dL (ref 100–199)
HDL: 30 mg/dL — ABNORMAL LOW (ref >39)
LDL Chol Calc (NIH): 71 mg/dL (ref 0–99)
Triglycerides: 150 mg/dL — ABNORMAL HIGH (ref 0–149)
VLDL Cholesterol Cal: 26 mg/dL (ref 5–40)

## 2021-06-04 LAB — HEPATIC FUNCTION PANEL
ALT: 32 IU/L (ref 0–44)
AST: 30 IU/L (ref 0–40)
Albumin: 4.2 g/dL (ref 3.7–4.7)
Alkaline Phosphatase: 90 IU/L (ref 44–121)
Bilirubin Total: 1.2 mg/dL (ref 0.0–1.2)
Bilirubin, Direct: 0.28 mg/dL (ref 0.00–0.40)
Total Protein: 6.4 g/dL (ref 6.0–8.5)

## 2021-06-04 LAB — PSA: Prostate Specific Ag, Serum: 1.7 ng/mL (ref 0.0–4.0)

## 2021-06-12 ENCOUNTER — Other Ambulatory Visit: Payer: Self-pay

## 2021-06-12 ENCOUNTER — Ambulatory Visit (INDEPENDENT_AMBULATORY_CARE_PROVIDER_SITE_OTHER): Payer: Managed Care, Other (non HMO) | Admitting: Family Medicine

## 2021-06-12 VITALS — BP 122/78 | HR 57 | Temp 97.2°F | Wt 205.0 lb

## 2021-06-12 DIAGNOSIS — T466X5A Adverse effect of antihyperlipidemic and antiarteriosclerotic drugs, initial encounter: Secondary | ICD-10-CM | POA: Diagnosis not present

## 2021-06-12 DIAGNOSIS — E782 Mixed hyperlipidemia: Secondary | ICD-10-CM

## 2021-06-12 DIAGNOSIS — Z0001 Encounter for general adult medical examination with abnormal findings: Secondary | ICD-10-CM | POA: Diagnosis not present

## 2021-06-12 DIAGNOSIS — M791 Myalgia, unspecified site: Secondary | ICD-10-CM | POA: Diagnosis not present

## 2021-06-12 DIAGNOSIS — Z0000 Encounter for general adult medical examination without abnormal findings: Secondary | ICD-10-CM

## 2021-06-12 MED ORDER — EZETIMIBE 10 MG PO TABS: 10.0000 mg | ORAL_TABLET | Freq: Every day | ORAL | 12 refills | Status: DC

## 2021-06-12 NOTE — Patient Instructions (Addendum)
Results for orders placed or performed in visit on 38/88/28  Basic metabolic panel  Result Value Ref Range   Glucose 83 70 - 99 mg/dL   BUN 17 8 - 27 mg/dL   Creatinine, Ser 1.13 0.76 - 1.27 mg/dL   eGFR 69 >59 mL/min/1.73   BUN/Creatinine Ratio 15 10 - 24   Sodium 141 134 - 144 mmol/L   Potassium 4.1 3.5 - 5.2 mmol/L   Chloride 105 96 - 106 mmol/L   CO2 23 20 - 29 mmol/L   Calcium 9.4 8.6 - 10.2 mg/dL  Lipid panel  Result Value Ref Range   Cholesterol, Total 127 100 - 199 mg/dL   Triglycerides 150 (H) 0 - 149 mg/dL   HDL 30 (L) >39 mg/dL   VLDL Cholesterol Cal 26 5 - 40 mg/dL   LDL Chol Calc (NIH) 71 0 - 99 mg/dL   Chol/HDL Ratio 4.2 0.0 - 5.0 ratio  Hepatic function panel  Result Value Ref Range   Total Protein 6.4 6.0 - 8.5 g/dL   Albumin 4.2 3.7 - 4.7 g/dL   Bilirubin Total 1.2 0.0 - 1.2 mg/dL   Bilirubin, Direct 0.28 0.00 - 0.40 mg/dL   Alkaline Phosphatase 90 44 - 121 IU/L   AST 30 0 - 40 IU/L   ALT 32 0 - 44 IU/L  PSA  Result Value Ref Range   Prostate Specific Ag, Serum 1.7 0.0 - 4.0 ng/mL   Shingrix and shingles prevention: know the facts!   Shingrix is a very effective vaccine to prevent shingles.   Shingles is a reactivation of chickenpox -more than 99% of Americans born before 1980 have had chickenpox even if they do not remember it. One in every 10 people who get shingles have severe long-lasting nerve pain as a result.   33 out of a 100 older adults will get shingles if they are unvaccinated.     This vaccine is very important for your health This vaccine is indicated for anyone 50 years or older. You can get this vaccine even if you have already had shingles because you can get the disease more than once in a lifetime.  Your risk for shingles and its complications increases with age.  This vaccine has 2 doses.  The second dose would be 2 to 6 months after the first dose.  If you had Zostavax vaccine in the past you should still get Shingrix. (  Zostavax is only 70% effective and it loses significant strength over a few years .)  This vaccine is given through the pharmacy.  The cost of the vaccine is through your insurance. The pharmacy can inform you of the total costs.  Common side effects including soreness in the arm, some redness and swelling, also some feel fatigue muscle soreness headache low-grade fever.  Side effects typically go away within 2 to 3 days. Remember-the pain from shingles can last a lifetime but these side effects of the vaccine will only last a few days at most. It is very important to get both doses in order to protect yourself fully.   Please get this vaccine at your earliest convenience at your trusted pharmacy.

## 2021-06-12 NOTE — Progress Notes (Signed)
° °  Subjective:    Patient ID: Steven Glover, male    DOB: Oct 20, 1950, 71 y.o.   MRN: [id number]  HPI The patient comes in today for a wellness visit.  Patient does try to do a good job of healthy eating and regular activity He is followed by his specialist for rheumatoid arthritis Denies any major setbacks recently Does not tolerate statins We did discuss Repatha He would have a hard time affording this He is interested in utilizing Zetia even if it means he is having to pay out-of-pocket for   A review of their health history was completed.  A review of medications was also completed.  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   Additional concerns:    Review of Systems     Objective:   Physical Exam General-in no acute distress Eyes-no discharge Lungs-respiratory rate normal, CTA CV-no murmurs,RRR Extremities skin warm dry no edema Neuro grossly normal Behavior normal, alert  Outside of the window for prostate exam with age.      Assessment & Plan:  Adult wellness-complete.wellness physical was conducted today. Importance of diet and exercise were discussed in detail.  In addition to this a discussion regarding safety was also covered. We also reviewed over immunizations and gave recommendations regarding current immunization needed for age.  In addition to this additional areas were also touched on including: Preventative health exams needed:  Colonoscopy up-to-date Next 06/12/2024  Patient was advised yearly wellness exam  Shared decision-patient will stay on Zetia.  Does not tolerate statins.  Does not want to start Repatha due to cost Patient states he will pay out-of-pocket for this with good  Rx

## 2021-07-06 ENCOUNTER — Other Ambulatory Visit: Payer: Self-pay

## 2021-07-06 ENCOUNTER — Ambulatory Visit (INDEPENDENT_AMBULATORY_CARE_PROVIDER_SITE_OTHER): Payer: Managed Care, Other (non HMO)

## 2021-07-06 ENCOUNTER — Ambulatory Visit
Admission: RE | Admit: 2021-07-06 | Discharge: 2021-07-06 | Disposition: A | Discharge status: Home / Self Care | Payer: Managed Care, Other (non HMO) | Source: Ambulatory Visit | Attending: Urgent Care | Admitting: Urgent Care

## 2021-07-06 VITALS — BP 134/75 | HR 80 | Temp 97.7°F | Resp 18

## 2021-07-06 DIAGNOSIS — Z9889 Other specified postprocedural states: Secondary | ICD-10-CM | POA: Diagnosis not present

## 2021-07-06 DIAGNOSIS — M79675 Pain in left toe(s): Secondary | ICD-10-CM

## 2021-07-06 DIAGNOSIS — L03032 Cellulitis of left toe: Secondary | ICD-10-CM

## 2021-07-06 MED ORDER — DOXYCYCLINE HYCLATE 100 MG PO CAPS: 100.0000 mg | ORAL_CAPSULE | Freq: Two times a day (BID) | ORAL | 0 refills | Status: DC

## 2021-07-06 MED ORDER — ACETAMINOPHEN 325 MG PO TABS: 650.0000 mg | ORAL_TABLET | Freq: Four times a day (QID) | ORAL | 0 refills | Status: DC | PRN

## 2021-07-06 NOTE — ED Triage Notes (Signed)
Pt reports shooting  pain and swelling in the left big toe x 2 weeks. Pt denies any injury.

## 2021-07-06 NOTE — ED Provider Notes (Signed)
[place]   MRN: [id number] DOB: Jul 21, 1950  Subjective:   Steven Glover is a 71 y.o. male presenting for 2-week history of persistent and worsening left great toe pain, left foot swelling and pain.  Patient does have a history of surgery over the left great toe and the left second toe.  Has not had issues with this in the past.  Has not had a trauma that he can think of.  No history of gout.  No history of peripheral arterial disease.  No current facility-administered medications for this encounter.  Current Outpatient Medications:    cetirizine (ZYRTEC) 5 MG tablet, Take 1 tablet (5 mg total) by mouth daily., Disp: 30 tablet, Rfl: 0   etanercept (ENBREL) 50 MG/ML injection, Inject 50 mg into the skin every Monday. , Disp: , Rfl:    ezetimibe (ZETIA) 10 MG tablet, Take 1 tablet (10 mg total) by mouth daily., Disp: 30 tablet, Rfl: 12   fexofenadine (ALLEGRA) 180 MG tablet, Take 180 mg by mouth daily as needed for allergies., Disp: , Rfl:    folic acid (FOLVITE) 1 MG tablet, Take 1 mg by mouth daily.  , Disp: , Rfl:    methotrexate 2.5 MG tablet, Take 17.5 mg by mouth every Monday. 7 tablets-Monday, Disp: , Rfl:    omeprazole (PRILOSEC) 20 MG capsule, Take 1 capsule (20 mg total) by mouth daily before breakfast., Disp: 90 capsule, Rfl: 3   Allergies  Allergen Reactions   Cefprozil     Nausea, felt bad   Statins     myalgias    Past Medical History:  Diagnosis Date   Acid reflux disease    Allergy    Asthma    as child   BMI between 19-24,adult JUL 2012 191.8 LBS   Collagen vascular disease ([place])    GERD (gastroesophageal reflux disease)    Internal hemorrhoids with other complication    TCS DEC 9833   Pulmonary nodule 2013   [facility] - Repeat 08/2012   Rheumatoid arthritis(714.0)      Past Surgical History:  Procedure Laterality Date   COLONOSCOPY  2009 NUR [facility]   MULTIPLE SIMPLE ADENOMAS   COLONOSCOPY N/A 05/02/2017   Dr. Oneida Alar: 4 simple adenomas  removed, diverticulosis.  Next colonoscopy in 3 years.   COLONOSCOPY W/ POLYPECTOMY  2010 NUR   COLONOSCOPY WITH PROPOFOL  05/15/2012   SLF:Two sessile polyps ranging between 3-50mm in size were found in the ascending colon and rectum; multiple biopsies were performed/Mild diverticulosis was noted in the sigmoid colon/The colon mucosa was otherwise normal/ Moderate sized internal hemorrhoids. TCS 04/2017.   COLONOSCOPY WITH PROPOFOL N/A 04/29/2020   Procedure: COLONOSCOPY WITH PROPOFOL;  Surgeon: Eloise Harman, DO;  [facility];  Service: Endoscopy;  Laterality: N/A;  7:30am   ESOPHAGOGASTRODUODENOSCOPY  01/01/2011 [facility] CHRONIC GASTRITIS   SLF: Hiatal Hernia/mild gastritis/stricture in the distal esophagus   FOOT SURGERY     POLYPECTOMY  05/15/2012   SIMPLE ADENOMA(2)   POLYPECTOMY  04/29/2020   Procedure: POLYPECTOMY;  Surgeon: Eloise Harman, DO;  [facility];  Service: Endoscopy;;   SAVORY DILATION  01/01/2011   Procedure: SAVORY DILATION;  Surgeon: Dorothyann Peng, MD;  [facility];  Service: Endoscopy;  Laterality: N/A;   SHOULDER OPEN ROTATOR CUFF REPAIR  2005   WRIST FUSION      Family History  Problem Relation Age of Onset   Diabetes Father    Cancer Sister  breast   Colon cancer Neg Hx    Colon polyps Neg Hx     Social History   Tobacco Use   Smoking status: Never   Smokeless tobacco: Never  Vaping Use   Vaping Use: Never used  Substance Use Topics   Alcohol use: No   Drug use: No    ROS   Objective:   Vitals: BP 134/75 (BP Location: Right Arm)    Pulse 80    Temp 97.7 F (36.5 C) (Oral)    Resp 18    SpO2 96%   Physical Exam Constitutional:      General: He is not in acute distress.    Appearance: Normal appearance. He is well-developed and normal weight. He is not ill-appearing, toxic-appearing or diaphoretic.  HENT:     Head: Normocephalic and atraumatic.     Right Ear: External ear normal.     Left Ear:  External ear normal.     Nose: Nose normal.     Mouth/Throat:     Pharynx: Oropharynx is clear.  Eyes:     General: No scleral icterus.       Right eye: No discharge.        Left eye: No discharge.     Extraocular Movements: Extraocular movements intact.  Cardiovascular:     Rate and Rhythm: Normal rate.  Pulmonary:     Effort: Pulmonary effort is normal.  Musculoskeletal:     Cervical back: Normal range of motion.       Feet:     Comments: There is no evidence of paronychia about the left great toe.  He is exquisitely tender however.  Dorsalis pedis and posterior tibial pulses 2+.  No open or draining wounds.  Neurological:     Mental Status: He is alert and oriented to person, place, and time.  Psychiatric:        Mood and Affect: Mood normal.        Behavior: Behavior normal.        Thought Content: Thought content normal.        Judgment: Judgment normal.      DG Foot Complete Left  Result Date: 07/06/2021 CLINICAL DATA:  Left great toe pain EXAM: LEFT FOOT - COMPLETE 3+ VIEW COMPARISON:  02/05/2016 FINDINGS: Postoperative changes from prior bunion correction with intact hardware present within the first metatarsal. Additional screws are present within the second metatarsal head. Similar degree of arthropathy of the first and second MTP joints. Ankylosis of the second and third PIP joints. No acute fracture. No dislocation. No periosteal elevation. Prominent pes planus alignment. No soft tissue swelling. IMPRESSION: 1. No acute osseous abnormality, left foot. 2. Postsurgical changes from prior bunion correction. 3. 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 Electronically Signed   By: Davina Poke D.O.   On: 07/06/2021 11:14     Assessment and Plan :   PDMP not reviewed this encounter.  1. Cellulitis of toe of left foot   2. Great toe pain, left   3. History of foot surgery    No evidence of osteomyelitis or paronychia.  I do suspect an infectious  process however and therefore we will have him start doxycycline.  Use Tylenol for pain relief.  I placed a referral to [facility] for consideration of procedural removal of the left great toenail versus incision and drainage for an underlying abscess. Counseled patient on potential for adverse effects with medications prescribed/recommended today, ER  and return-to-clinic precautions discussed, patient verbalized understanding.    Jaynee Eagles, PA-C 07/06/21 1226

## 2021-07-09 ENCOUNTER — Other Ambulatory Visit: Payer: Self-pay

## 2021-07-09 ENCOUNTER — Ambulatory Visit: Payer: Managed Care, Other (non HMO) | Admitting: Podiatry

## 2021-07-09 DIAGNOSIS — L539 Erythematous condition, unspecified: Secondary | ICD-10-CM

## 2021-07-09 DIAGNOSIS — L600 Ingrowing nail: Secondary | ICD-10-CM | POA: Diagnosis not present

## 2021-07-09 MED ORDER — MUPIROCIN 2 % EX OINT: 1.0000 "application " | TOPICAL_OINTMENT | Freq: Two times a day (BID) | CUTANEOUS | 2 refills | Status: DC

## 2021-07-12 NOTE — Progress Notes (Signed)
Subjective:   Patient ID: Steven Glover, male   DOB: 71 y.o.   MRN: [id number]   HPI 71 year old male presents the office today with his wife for concerns of ingrown toenail left big toe.  Recently he noticed some swelling or redness he thought he had gout when he was seen in urgent care and he was started on antibiotics which she is still taking.  He started this on Monday.  Also urgent care and x-ray was obtained.  He has been soaking his foot which has been helpful.  Overall symptoms are improving.  No drainage or pus.   Review of Systems  All other systems reviewed and are negative.  Past Medical History:  Diagnosis Date   Acid reflux disease    Allergy    Asthma    as child   BMI between 19-24,adult JUL 2012 191.8 LBS   Collagen vascular disease ([place])    GERD (gastroesophageal reflux disease)    Internal hemorrhoids with other complication    TCS DEC 5974   Pulmonary nodule 2013   [facility] - Repeat 08/2012   Rheumatoid arthritis(714.0)     Past Surgical History:  Procedure Laterality Date   COLONOSCOPY  2009 NUR [facility]   MULTIPLE SIMPLE ADENOMAS   COLONOSCOPY N/A 05/02/2017   Dr. Oneida Alar: 4 simple adenomas removed, diverticulosis.  Next colonoscopy in 3 years.   COLONOSCOPY W/ POLYPECTOMY  2010 NUR   COLONOSCOPY WITH PROPOFOL  05/15/2012   SLF:Two sessile polyps ranging between 3-27mm in size were found in the ascending colon and rectum; multiple biopsies were performed/Mild diverticulosis was noted in the sigmoid colon/The colon mucosa was otherwise normal/ Moderate sized internal hemorrhoids. TCS 04/2017.   COLONOSCOPY WITH PROPOFOL N/A 04/29/2020   Procedure: COLONOSCOPY WITH PROPOFOL;  Surgeon: Eloise Harman, DO;  [facility];  Service: Endoscopy;  Laterality: N/A;  7:30am   ESOPHAGOGASTRODUODENOSCOPY  01/01/2011 [facility] CHRONIC GASTRITIS   SLF: Hiatal Hernia/mild gastritis/stricture in the distal esophagus   FOOT SURGERY     POLYPECTOMY  05/15/2012    SIMPLE ADENOMA(2)   POLYPECTOMY  04/29/2020   Procedure: POLYPECTOMY;  Surgeon: Eloise Harman, DO;  [facility];  Service: Endoscopy;;   SAVORY DILATION  01/01/2011   Procedure: SAVORY DILATION;  Surgeon: Dorothyann Peng, MD;  [facility];  Service: Endoscopy;  Laterality: N/A;   SHOULDER OPEN ROTATOR CUFF REPAIR  2005   WRIST FUSION       Current Outpatient Medications:    mupirocin ointment (BACTROBAN) 2 %, Apply 1 application topically 2 (two) times daily., Disp: 30 g, Rfl: 2   acetaminophen (TYLENOL) 325 MG tablet, Take 2 tablets (650 mg total) by mouth every 6 (six) hours as needed., Disp: 30 tablet, Rfl: 0   cetirizine (ZYRTEC) 5 MG tablet, Take 1 tablet (5 mg total) by mouth daily., Disp: 30 tablet, Rfl: 0   doxycycline (VIBRAMYCIN) 100 MG capsule, Take 1 capsule (100 mg total) by mouth 2 (two) times daily., Disp: 20 capsule, Rfl: 0   etanercept (ENBREL) 50 MG/ML injection, Inject 50 mg into the skin every Monday. , Disp: , Rfl:    ezetimibe (ZETIA) 10 MG tablet, Take 1 tablet (10 mg total) by mouth daily., Disp: 30 tablet, Rfl: 12   fexofenadine (ALLEGRA) 180 MG tablet, Take 180 mg by mouth daily as needed for allergies., Disp: , Rfl:    folic acid (FOLVITE) 1 MG tablet, Take 1 mg by mouth daily.  ,  Disp: , Rfl:    methotrexate 2.5 MG tablet, Take 17.5 mg by mouth every Monday. 7 tablets-Monday, Disp: , Rfl:    omeprazole (PRILOSEC) 20 MG capsule, Take 1 capsule (20 mg total) by mouth daily before breakfast., Disp: 90 capsule, Rfl: 3  Allergies  Allergen Reactions   Cefprozil     Nausea, felt bad   Statins     myalgias          Objective:  Physical Exam  General: AAO x3, NAD  Dermatological: Left hallux nail is mildly hypertrophic, dystrophic with brown discoloration mostly on lateral nail border.  Lateral nail distally is somewhat loose with underlying nailbed but the proximal nail fold is adhered.  There is some mild residual edema and erythema  but there is no drainage or pus or ascending cellulitis.  No increase in temperature.  Clinically he states that his symptoms are much improved since starting the antibiotic on Monday.  Vascular: Dorsalis Pedis artery and Posterior Tibial artery pedal pulses are 2/4 bilateral with immedate capillary fill time. There is no pain with calf compression, swelling, warmth, erythema.   Neruologic: Grossly intact via light touch bilateral.   Musculoskeletal: Minimal tenderness to the toenail.  No other areas of discomfort.  Muscular strength 5/5 in all groups tested bilateral.  Gait: Unassisted, Nonantalgic.       Assessment:   71 year old male with ingrown toenail, improving infection     Plan:  -Treatment options discussed including all alternatives, risks, and complications -Etiology of symptoms were discussed -Reviewed the x-rays from urgent care. -We discussed partial, total nail avulsion however symptoms appear to be improving.  There is slight incurvation of the nail.  I did sharply debride the lateral portion of the nail with any complications or bleeding.  At this point since symptoms are improving recommend continuing finish the course of antibiotics and recommend Epsom salt soaks twice a day as well as antibiotic ointment on the nail.  If symptoms recur or do not improve consider partial versus total nail avulsion but for now and continue to monitor this.  Trula Slade DPM

## 2021-07-30 ENCOUNTER — Ambulatory Visit: Payer: Managed Care, Other (non HMO) | Admitting: Podiatry

## 2021-12-25 ENCOUNTER — Ambulatory Visit
Admission: RE | Admit: 2021-12-25 | Discharge: 2021-12-25 | Disposition: A | Discharge status: Home / Self Care | Payer: Medicare Other | Source: Ambulatory Visit

## 2021-12-25 ENCOUNTER — Other Ambulatory Visit: Payer: Self-pay

## 2021-12-25 VITALS — BP 145/77 | HR 68 | Temp 97.8°F | Resp 20

## 2021-12-25 DIAGNOSIS — L03116 Cellulitis of left lower limb: Secondary | ICD-10-CM

## 2021-12-25 MED ORDER — CEPHALEXIN 500 MG PO CAPS: 500.0000 mg | ORAL_CAPSULE | Freq: Four times a day (QID) | ORAL | 0 refills | Status: AC

## 2021-12-25 NOTE — ED Triage Notes (Signed)
Pt reports has calluses to bottom of feet periodically and reports was trying to remove one a few days ago on left foot and reports the dressing came off during the night and removed first layer of skin that had formed over site. Pt concerned for possible infection.

## 2021-12-25 NOTE — ED Provider Notes (Signed)
[facility]    CSN: [id number] Arrival date & time: 12/25/21  1141      History   Chief Complaint Chief Complaint  Patient presents with   Foot Pain    Infected area on botton of left foot - Entered by patient    HPI Steven Glover is a 71 y.o. male.   Patient presents with wife for wound to the bottom of his left foot that has been present for about 1 week.  He endorses some drainage and pain around the wound, feels like it is red around the wound.  He denies significant swelling, fevers, nausea/vomiting.  He has been trying to keep the wound clean and wearing a bandage over it.  Patient reports he has a history of rheumatoid arthritis, is on an immunotherapy infusion and worries that the wound may be infected.  Per HPI    Past Medical History:  Diagnosis Date   Acid reflux disease    Allergy    Asthma    as child   BMI between 19-24,adult JUL 2012 191.8 LBS   Collagen vascular disease ([place])    GERD (gastroesophageal reflux disease)    Internal hemorrhoids with other complication    TCS DEC 1700   Pulmonary nodule 2013   [facility] - Repeat 08/2012   Rheumatoid arthritis(714.0)     Patient Active Problem List   Diagnosis Date Noted   Myalgia due to statin 06/12/2021   Hx of colonic polyps    Thrombocytopenia ([place]) 06/23/2016   BPH (benign prostatic hyperplasia) 01/28/2016   Dyslipidemia (high LDL; low HDL) 01/28/2016   Internal hemorrhoids with complication 17/49/4496   GERD (gastroesophageal reflux disease) 04/21/2011   Colon cancer screening 04/21/2011   Dysphagia 12/30/2010    Past Surgical History:  Procedure Laterality Date   COLONOSCOPY  2009 NUR [place]   MULTIPLE SIMPLE ADENOMAS   COLONOSCOPY N/A 05/02/2017   Dr. Oneida Alar: 4 simple adenomas removed, diverticulosis.  Next colonoscopy in 3 years.   COLONOSCOPY W/ POLYPECTOMY  2010 NUR   COLONOSCOPY WITH PROPOFOL  05/15/2012   SLF:Two sessile polyps ranging between 3-25m in size were found in  the ascending colon and rectum; multiple biopsies were performed/Mild diverticulosis was noted in the sigmoid colon/The colon mucosa was otherwise normal/ Moderate sized internal hemorrhoids. TCS 04/2017.   COLONOSCOPY WITH PROPOFOL N/A 04/29/2020   Procedure: COLONOSCOPY WITH PROPOFOL;  Surgeon: CEloise Harman DO;  [facility];  Service: Endoscopy;  Laterality: N/A;  7:30am   ESOPHAGOGASTRODUODENOSCOPY  01/01/2011 [facility]:FFMBCHRONIC GASTRITIS   SLF: Hiatal Hernia/mild gastritis/stricture in the distal esophagus   FOOT SURGERY     POLYPECTOMY  05/15/2012   SIMPLE ADENOMA(2)   POLYPECTOMY  04/29/2020   Procedure: POLYPECTOMY;  Surgeon: CEloise Harman DO;  Location: [facility];  Service: Endoscopy;;   SAVORY DILATION  01/01/2011   Procedure: SAVORY DILATION;  Surgeon: SDorothyann Peng MD;  Location: [facility];  Service: Endoscopy;  Laterality: N/A;   SHOULDER OPEN ROTATOR CUFF REPAIR  2005   WRIST FUSION         Home Medications    Prior to Admission medications   Medication Sig Start Date End Date Taking? Authorizing Provider  cephALEXin (KEFLEX) 500 MG capsule Take 1 capsule (500 mg total) by mouth 4 (four) times daily for 5 days. 12/25/21 12/30/21 Yes MEulogio Bear NP  Golimumab ([place] IV) Inject into the vein.   Yes [provider]  acetaminophen (  TYLENOL) 325 MG tablet Take 2 tablets (650 mg total) by mouth every 6 (six) hours as needed. 07/06/21   Jaynee Eagles, PA-C  cetirizine (ZYRTEC) 5 MG tablet Take 1 tablet (5 mg total) by mouth daily. 10/13/19   Avegno, Darrelyn Hillock, FNP  doxycycline (VIBRAMYCIN) 100 MG capsule Take 1 capsule (100 mg total) by mouth 2 (two) times daily. 07/06/21   Jaynee Eagles, PA-C  ezetimibe (ZETIA) 10 MG tablet Take 1 tablet (10 mg total) by mouth daily. 06/12/21   Kathyrn Drown, MD  fexofenadine (ALLEGRA) 180 MG tablet Take 180 mg by mouth daily as needed for allergies.    [provider]  folic acid (FOLVITE) 1 MG  tablet Take 1 mg by mouth daily.      [provider]  methotrexate 2.5 MG tablet Take 17.5 mg by mouth every Monday. 7 tablets-Monday    [provider]  mupirocin ointment (BACTROBAN) 2 % Apply 1 application topically 2 (two) times daily. 07/09/21   Trula Slade, DPM  omeprazole (PRILOSEC) 20 MG capsule Take 1 capsule (20 mg total) by mouth daily before breakfast. 08/08/18   Mahala Menghini, PA-C    Family History Family History  Problem Relation Age of Onset   Diabetes Father    Cancer Sister        breast   Colon cancer Neg Hx    Colon polyps Neg Hx     Social History Social History   Tobacco Use   Smoking status: Never   Smokeless tobacco: Never  Vaping Use   Vaping Use: Never used  Substance Use Topics   Alcohol use: No   Drug use: No     Allergies   Cefprozil and Statins   Review of Systems Review of Systems Per HPI  Physical Exam Triage Vital Signs ED Triage Vitals  Enc Vitals Group     BP 12/25/21 1155 (!) 145/77     Pulse Rate 12/25/21 1155 68     Resp 12/25/21 1155 20     Temp 12/25/21 1155 97.8 F (36.6 C)     Temp Source 12/25/21 1155 Oral     SpO2 12/25/21 1155 95 %     Weight --      Height --      Head Circumference --      Peak Flow --      Pain Score 12/25/21 1156 2     Pain Loc --      Pain Edu? --      Excl. in [place]? --    No data found.  Updated Vital Signs BP (!) 145/77 (BP Location: Right Arm)   Pulse 68   Temp 97.8 F (36.6 C) (Oral)   Resp 20   SpO2 95%   Visual Acuity Right Eye Distance:   Left Eye Distance:   Bilateral Distance:    Right Eye Near:   Left Eye Near:    Bilateral Near:     Physical Exam Vitals and nursing note reviewed.  Constitutional:      General: He is not in acute distress.    Appearance: Normal appearance. He is not toxic-appearing.  HENT:     Head: Normocephalic and atraumatic.     Mouth/Throat:     Mouth: Mucous membranes are moist.     Pharynx: Oropharynx is  clear.  Cardiovascular:     Pulses:          Dorsalis pedis pulses are 2+ on the  left side.  Pulmonary:     Effort: Pulmonary effort is normal. No respiratory distress.  Musculoskeletal:       Feet:  Feet:     Left foot:     Skin integrity: Skin breakdown and erythema present. No warmth.     Comments: Skin ulcer noted to left foot in area marked approximately 1 cm x 1 cm; there is about 1 mm of erythema surrounding wound.  No fluctuance, warmth, active drainage.  Slightly tender to touch. Skin:    Capillary Refill: Capillary refill takes less than 2 seconds.  Neurological:     Mental Status: He is alert and oriented to person, place, and time.  Psychiatric:        Behavior: Behavior is cooperative.      UC Treatments / Results  Labs (all labs ordered are listed, but only abnormal results are displayed) Labs Reviewed - No data to display  EKG   Radiology No results found.  Procedures Procedures (including critical care time)  Medications Ordered in UC Medications - No data to display  Initial Impression / Assessment and Plan / UC Course  I have reviewed the triage vital signs and the nursing notes.  Pertinent labs & imaging results that were available during my care of the patient were reviewed by me and considered in my medical decision making (see chart for details).    Patient is a very pleasant, well-appearing 71 year old male presenting for wound of the left foot today.  Given wound has been present for ~1 week without improvement, will cover for cellulitis with cephalexin.  He has an intolerance to Cefprozil  (nausea), however this is not a true allergy.  Discussed wound care.  ER precautions discussed and encouraged close follow up with no improvement.  The patient was given the opportunity to ask questions.  All questions answered to their satisfaction.  The patient is in agreement to this plan.   Final Clinical Impressions(s) / UC Diagnoses   Final diagnoses:   Cellulitis of left foot     Discharge Instructions      - Please clean the bottom of your foot twice daily with warm water and a mild cleanser - Apply nonadherent bandage after cleaning foot; if at home, can leave open to air - Start on the oral antibiotic (Keflex) to help clear up any infection and allow for healing - Seek care if symptoms worsen or persist despite treatment    ED Prescriptions     Medication Sig Dispense Auth. Provider   cephALEXin (KEFLEX) 500 MG capsule Take 1 capsule (500 mg total) by mouth 4 (four) times daily for 5 days. 20 capsule Eulogio Bear, NP      PDMP not reviewed this encounter.   Eulogio Bear, NP 12/25/21 1241

## 2021-12-25 NOTE — Discharge Instructions (Signed)
-   Please clean the bottom of your foot twice daily with warm water and a mild cleanser - Apply nonadherent bandage after cleaning foot; if at home, can leave open to air - Start on the oral antibiotic (Keflex) to help clear up any infection and allow for healing - Seek care if symptoms worsen or persist despite treatment

## 2022-02-07 ENCOUNTER — Emergency Department (HOSPITAL_COMMUNITY): Payer: Medicare Other

## 2022-02-07 ENCOUNTER — Emergency Department (HOSPITAL_COMMUNITY)
Admission: EM | Admit: 2022-02-07 | Discharge: 2022-02-07 | Disposition: A | Discharge status: Home / Self Care | Payer: Medicare Other | Attending: Emergency Medicine | Admitting: Emergency Medicine

## 2022-02-07 DIAGNOSIS — M549 Dorsalgia, unspecified: Secondary | ICD-10-CM | POA: Diagnosis not present

## 2022-02-07 DIAGNOSIS — R944 Abnormal results of kidney function studies: Secondary | ICD-10-CM | POA: Diagnosis not present

## 2022-02-07 DIAGNOSIS — R61 Generalized hyperhidrosis: Secondary | ICD-10-CM | POA: Insufficient documentation

## 2022-02-07 DIAGNOSIS — R072 Precordial pain: Secondary | ICD-10-CM | POA: Insufficient documentation

## 2022-02-07 DIAGNOSIS — N289 Disorder of kidney and ureter, unspecified: Secondary | ICD-10-CM | POA: Insufficient documentation

## 2022-02-07 DIAGNOSIS — R5383 Other fatigue: Secondary | ICD-10-CM | POA: Diagnosis not present

## 2022-02-07 LAB — COMPREHENSIVE METABOLIC PANEL
ALT: 29 U/L (ref 0–44)
AST: 29 U/L (ref 15–41)
Albumin: 4 g/dL (ref 3.5–5.0)
Alkaline Phosphatase: 72 U/L (ref 38–126)
Anion gap: 6 (ref 5–15)
BUN: 17 mg/dL (ref 8–23)
CO2: 24 mmol/L (ref 22–32)
Calcium: 8.9 mg/dL (ref 8.9–10.3)
Chloride: 108 mmol/L (ref 98–111)
Creatinine, Ser: 1.32 mg/dL — ABNORMAL HIGH (ref 0.61–1.24)
GFR, Estimated: 57 mL/min — ABNORMAL LOW (ref >60)
Glucose, Bld: 104 mg/dL — ABNORMAL HIGH (ref 70–99)
Potassium: 3.7 mmol/L (ref 3.5–5.1)
Sodium: 138 mmol/L (ref 135–145)
Total Bilirubin: 0.6 mg/dL (ref 0.3–1.2)
Total Protein: 6.7 g/dL (ref 6.5–8.1)

## 2022-02-07 LAB — CBC WITH DIFFERENTIAL/PLATELET
Abs Immature Granulocytes: 0.01 10*3/uL (ref 0.00–0.07)
Basophils Absolute: 0.1 10*3/uL (ref 0.0–0.1)
Basophils Relative: 1 %
Eosinophils Absolute: 0 10*3/uL (ref 0.0–0.5)
Eosinophils Relative: 1 %
HCT: 41.4 % (ref 39.0–52.0)
Hemoglobin: 15 g/dL (ref 13.0–17.0)
Immature Granulocytes: 0 %
Lymphocytes Relative: 16 %
Lymphs Abs: 0.7 10*3/uL (ref 0.7–4.0)
MCH: 34.2 pg — ABNORMAL HIGH (ref 26.0–34.0)
MCHC: 36.2 g/dL — ABNORMAL HIGH (ref 30.0–36.0)
MCV: 94.3 fL (ref 80.0–100.0)
Monocytes Absolute: 0.9 10*3/uL (ref 0.1–1.0)
Monocytes Relative: 21 %
Neutro Abs: 2.5 10*3/uL (ref 1.7–7.7)
Neutrophils Relative %: 61 %
Platelets: 134 10*3/uL — ABNORMAL LOW (ref 150–400)
RBC: 4.39 MIL/uL (ref 4.22–5.81)
RDW: 14.1 % (ref 11.5–15.5)
WBC: 4.1 10*3/uL (ref 4.0–10.5)
nRBC: 0 % (ref 0.0–0.2)

## 2022-02-07 LAB — TROPONIN I (HIGH SENSITIVITY)
Troponin I (High Sensitivity): 6 ng/L (ref <18)
Troponin I (High Sensitivity): 7 ng/L (ref <18)

## 2022-02-07 LAB — LIPASE, BLOOD: Lipase: 30 U/L (ref 11–51)

## 2022-02-07 MED ORDER — IOHEXOL 350 MG/ML SOLN
100.0000 mL | Freq: Once | INTRAVENOUS | Status: AC | PRN
  Administered 2022-02-07: 100 mL via INTRAVENOUS

## 2022-02-07 MED ORDER — ASPIRIN 81 MG PO CHEW
324.0000 mg | CHEWABLE_TABLET | Freq: Once | ORAL | Status: AC
  Administered 2022-02-07: 324 mg via ORAL
  Filled 2022-02-07: qty 4

## 2022-02-07 NOTE — ED Notes (Signed)
Pt in CT.

## 2022-02-07 NOTE — ED Notes (Signed)
ED Provider at bedside. 

## 2022-02-07 NOTE — ED Provider Notes (Signed)
[facility] EMERGENCY DEPARTMENT Provider Note   CSN: [id number] Arrival date & time: 02/07/22  0255     History  Chief Complaint  Patient presents with   Chest Pain    Steven Glover is a 71 y.o. male.  The history is provided by the patient and the spouse.  Chest Pain Pain location:  Substernal area Pain quality: pressure   Pain radiates to:  L arm and R arm Pain severity:  Severe Onset quality:  Sudden Duration:  15 minutes Timing:  Constant Progression:  Resolved Chronicity:  New Relieved by:  None tried Associated symptoms: back pain, diaphoresis and fatigue   Associated symptoms: no abdominal pain, no fever, no shortness of breath, no syncope, no vomiting and no weakness   Risk factors: high cholesterol and male sex   Risk factors: no aortic disease, no coronary artery disease, no hypertension, no prior DVT/PE and no smoking   Patient reports he went to bed in his usual state of health when he woke up with chest pain and pressure.  It radiated to both arms he had mild pain in his back.  No focal arm or leg weakness.  He reports he also felt sweaty.  No shortness of breath, no vomiting.  He is now back to baseline but overall does feel tired.  He reports he is very active, but does report over the last several weeks he had some palpitations and lightheadedness while working in the yard  No history of CAD.  He is a non-smoker.     Home Medications Prior to Admission medications   Medication Sig Start Date End Date Taking? Authorizing Provider  acetaminophen (TYLENOL) 325 MG tablet Take 2 tablets (650 mg total) by mouth every 6 (six) hours as needed. 07/06/21   Jaynee Eagles, PA-C  cetirizine (ZYRTEC) 5 MG tablet Take 1 tablet (5 mg total) by mouth daily. 10/13/19   Avegno, Darrelyn Hillock, FNP  ezetimibe (ZETIA) 10 MG tablet Take 1 tablet (10 mg total) by mouth daily. 06/12/21   Kathyrn Drown, MD  fexofenadine (ALLEGRA) 180 MG tablet Take 180 mg by mouth daily as  needed for allergies.    [provider]  folic acid (FOLVITE) 1 MG tablet Take 1 mg by mouth daily.      [provider]  Golimumab ([place] ARIA IV) Inject into the vein.    [provider]  methotrexate 2.5 MG tablet Take 17.5 mg by mouth every Monday. 7 tablets-Monday    [provider]  mupirocin ointment (BACTROBAN) 2 % Apply 1 application topically 2 (two) times daily. 07/09/21   Trula Slade, DPM  omeprazole (PRILOSEC) 20 MG capsule Take 1 capsule (20 mg total) by mouth daily before breakfast. 08/08/18   Mahala Menghini, PA-C      Allergies    Cefprozil and Statins    Review of Systems   Review of Systems  Constitutional:  Positive for diaphoresis and fatigue. Negative for fever.  Respiratory:  Negative for shortness of breath.   Cardiovascular:  Positive for chest pain. Negative for syncope.  Gastrointestinal:  Negative for abdominal pain and vomiting.  Musculoskeletal:  Positive for back pain.  Neurological:  Negative for weakness.    Physical Exam Updated Vital Signs BP 128/85   Pulse 61   Temp 98.1 F (36.7 C) (Oral)   Resp 17   Ht 1.905 m ('6\' 3"'$ )   Wt 93 kg   SpO2 98%   BMI 25.62 kg/m  Physical Exam CONSTITUTIONAL: Elderly, no acute distress HEAD: Normocephalic/atraumatic EYES: EOMI/PERRL ENMT: Mucous membranes moist NECK: supple no meningeal signs SPINE/BACK:entire spine nontender CV: S1/S2 noted, no murmurs/rubs/gallops noted LUNGS: Lungs are clear to auscultation bilaterally, no apparent distress ABDOMEN: soft, nontender NEURO: Pt is awake/alert/appropriate, moves all extremitiesx4.  No facial droop.   EXTREMITIES: pulses normal/equalx4, full ROM, no calf tenderness SKIN: warm, color normal PSYCH: no abnormalities of mood noted, alert and oriented to situation  ED Results / Procedures / Treatments   Labs (all labs ordered are listed, but only abnormal results are displayed) Labs Reviewed  CBC WITH  DIFFERENTIAL/PLATELET - Abnormal; Notable for the following components:      Result Value   MCH 34.2 (*)    MCHC 36.2 (*)    Platelets 134 (*)    All other components within normal limits  COMPREHENSIVE METABOLIC PANEL - Abnormal; Notable for the following components:   Glucose, Bld 104 (*)    Creatinine, Ser 1.32 (*)    GFR, Estimated 57 (*)    All other components within normal limits  LIPASE, BLOOD  TROPONIN I (HIGH SENSITIVITY)  TROPONIN I (HIGH SENSITIVITY)    EKG EKG Interpretation  Date/Time:  Sunday February 07 2022 03:06:23 EDT Ventricular Rate:  63 PR Interval:  190 QRS Duration: 99 QT Interval:  403 QTC Calculation: 413 R Axis:   33 Text Interpretation: Sinus rhythm RSR' in V1 or V2, probably normal variant Confirmed by Ripley Fraise [phone]) on 02/07/2022 3:12:00 AM  Radiology CT Angio Chest/Abd/Pel for Dissection W and/or Wo Contrast  Result Date: 02/07/2022 CLINICAL DATA:  Acute aortic syndrome suspected. Sudden onset chest pain EXAM: CT ANGIOGRAPHY CHEST, ABDOMEN AND PELVIS TECHNIQUE: Non-contrast CT of the chest was initially obtained. Multidetector CT imaging through the chest, abdomen and pelvis was performed using the standard protocol during bolus administration of intravenous contrast. Multiplanar reconstructed images and MIPs were obtained and reviewed to evaluate the vascular anatomy. RADIATION DOSE REDUCTION: This exam was performed according to the departmental dose-optimization program which includes automated exposure control, adjustment of the mA and/or kV according to patient size and/or use of iterative reconstruction technique. CONTRAST:  140m OMNIPAQUE IOHEXOL 350 MG/ML SOLN COMPARISON:  CT scan of the chest 01/11/2011 FINDINGS: CTA CHEST FINDINGS Cardiovascular: No evidence of high attenuation within the aortic media on the initial noncontrast enhanced images to suggest the presence of an acute intramural hematoma. Following administration of  intravenous contrast, there is excellent opacification of the aorta and branch vessels. Conventional 3 vessel arch. The aortic root, ascending, transverse and descending thoracic aorta are normal in caliber. No evidence of aneurysm. No significant atherosclerotic plaque. The main pulmonary artery is normal in size. The heart is normal in size. Trace calcifications visualized along the coronary arteries. No pericardial effusion. Mediastinum/Nodes: Normal appearance of the thyroid gland. No suspicious mediastinal or hilar adenopathy. Moderately large sliding hiatal hernia. Lungs/Pleura: Mild biapical pleuroparenchymal scarring appears unchanged compared to prior imaging from 2012. Small scattered pulmonary nodules in the right middle lobe and along the right major fissure are also unchanged dating back to 2012 consistent with benign findings. Minimal emphysematous changes in the inferior aspect of the lingula and left lower lobe. No pneumothorax or pleural effusion. Musculoskeletal: No acute fracture or aggressive appearing lytic or blastic osseous lesion. Review of the MIP images confirms the above findings. CTA ABDOMEN AND PELVIS FINDINGS VASCULAR Aorta: Normal caliber aorta without dissection or aneurysm. Celiac: Celiac artery origin is widely patent. No significant plaque  or stenosis. A small aneurysm is present in the splenic artery distally near the hilum. The aneurysm measures up to 1 cm in diameter. There is partial peripheral calcification SMA: Patent without evidence of aneurysm, dissection, vasculitis or significant stenosis. Renals: Both renal arteries are patent without evidence of aneurysm, dissection, vasculitis, fibromuscular dysplasia or significant stenosis. IMA: Patent without evidence of aneurysm, dissection, vasculitis or significant stenosis. Inflow: Patent without evidence of aneurysm, dissection, vasculitis or significant stenosis. Veins: No obvious venous abnormality within the limitations of  this arterial phase study. Review of the MIP images confirms the above findings. NON-VASCULAR Hepatobiliary: No focal liver abnormality is seen. No gallstones, gallbladder wall thickening, or biliary dilatation. Pancreas: Unremarkable. No pancreatic ductal dilatation or surrounding inflammatory changes. Spleen: Normal in size without focal abnormality. Adrenals/Urinary Tract: Adrenal glands are unremarkable. Kidneys are normal, without renal calculi, focal lesion, or hydronephrosis. Bladder is unremarkable. Stomach/Bowel: Colonic diverticular disease without CT evidence of active inflammation. Moderately large volume of stool in the colon and rectum suggests constipation. The appendix is normal. There is an inflammatory process within the ileocolic mesentery in the right lower quadrant. The loops of small bowel are closely adherent. There is a suggestion of either a Meckel's diverticulum or enteroenteric fistula with associated inflammatory stranding in the adjacent mesentery. Lymphatic: No suspicious lymphadenopathy. Reproductive: Prostatomegaly. Other: No abdominal wall hernia or abnormality. No abdominopelvic ascites. Musculoskeletal: Mild grade 1 anterolisthesis of L4 on L5 measuring approximately 6 mm. L5-S1 degenerative disc disease. Review of the MIP images confirms the above findings. IMPRESSION: 1. No evidence of aortic dissection or other source for acute aortic syndrome. 2. Inflammatory process within the ileocolic mesentery of the right lower quadrant. Several loops of distal ileum and the terminal ileum are closely apposed and there is a suggestion of either an inflamed elongated Meckel's diverticulum, or an enteroenteric fistula. Inflammatory stranding is present within the mesentery but there is no sign of perforation or discrete abscess. 3. The appendix is well seen and is normal. 4. Colonic diverticular disease without CT evidence of active inflammation. 5. 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 Consider follow-up CT arteriogram of the abdomen in 1-2 years to confirm stability. 6. Trace aortic and coronary artery atherosclerotic calcifications. 7. Mild grade 1 anterolisthesis of L4 on L5 with associated lower lumbar degenerative disc disease. 8. Mild emphysematous changes versus early honeycombing in the inferior aspects of the lingula and left lower lobe. 9. Prostatomegaly. Aortic Atherosclerosis (ICD10-I70.0) and Emphysema (ICD10-J43.9). Electronically Signed   By: Jacqulynn Cadet M.D.   On: 02/07/2022 06:36   DG Chest Port 1 View  Result Date: 02/07/2022 CLINICAL DATA:  Chest pain. EXAM: PORTABLE CHEST 1 VIEW COMPARISON:  01/05/2011, 01/11/2011. FINDINGS: The heart size and mediastinal contours are within normal limits. Mild atelectasis is noted at the lung bases. No consolidation, effusion, or pneumothorax. A slightly nodular density is noted in the right upper lobe measuring 8 mm. No acute osseous abnormality. IMPRESSION: 1. No active disease. 2. 8 mm nodular density in the right upper lobe. CT is suggested for further evaluation on nonemergent follow-up. Electronically Signed   By: Brett Fairy M.D.   On: 02/07/2022 03:33    Procedures Procedures    Medications Ordered in ED Medications  aspirin chewable tablet 324 mg (324 mg Oral Given 02/07/22 0356)  iohexol (OMNIPAQUE) 350 MG/ML injection 100 mL (100 mLs Intravenous Contrast Given 02/07/22 0540)    ED Course/ Medical Decision Making/ A&P Clinical Course as of 02/07/22  [place] Feb 07, 2022  0455 Patient resting comfortably, appears well.  We will continue to monitor.  He does report that his lung nodule has been worked up previously with previous CT scans at [facility] [DW]  0456 Creatinine(!): 1.32 Mild renal insufficiency [DW]  0507 On further discussion, patient reports he did have significant back pain and  did feel like it was a pulling in his chest and back, patient was also hypertensive on arrival which is  unusual for him.  While we are waiting work-up, we will also perform CT dissection [DW]  8099 Patient is feeling improved.  No further chest pain.  CT dissection was negative for acute aortic syndrome.  He had multiple incidental findings, but this can be follow-up as an outpatient.  He had no abdominal tenderness.  These were all likely chronic issues found in his CT abdomen pelvis.  Epic message has been sent to his PCP [DW]  [phone] Discussed at length with patient, wife, daughter via phone.  We discussed admission versus discharge.  Patient and family are comfortable with going home and with close cardiology follow-up.  Urgent referral has been sent to cardiology.  We have discussed strict ER return precautions [DW]    Clinical Course User Index [DW] Ripley Fraise, MD           HEART Score: 5                Medical Decision Making Amount and/or Complexity of Data Reviewed Labs: ordered. Decision-making details documented in ED Course. Radiology: ordered.  Risk OTC drugs. Prescription drug management.   This patient presents to the ED for concern of chest pain, this involves an extensive number of treatment options, and is a complaint that carries with it a high risk of complications and morbidity.  The differential diagnosis includes but is not limited to acute coronary syndrome, aortic dissection, pulmonary embolism, pericarditis, pneumothorax, pneumonia, myocarditis, pleurisy, esophageal rupture    Comorbidities that complicate the patient evaluation: Patient's presentation is complicated by their history of hyperlipidemia  Additional history obtained: Additional history obtained from spouse Records reviewed Primary Care Documents  Lab Tests: I Ordered, and personally interpreted labs.  The pertinent results include:  renal insufficiency  Imaging Studies ordered: I ordered imaging studies including X-ray chest   I independently visualized and interpreted imaging which showed  ?nodule, no other acute findings I agree with the radiologist interpretation  Cardiac Monitoring: The patient was maintained on a cardiac monitor.  I personally viewed and interpreted the cardiac monitor which showed an underlying rhythm of:  sinus rhythm  Medicines ordered and prescription drug management: I ordered medication including ASA  for chest pain  Reevaluation of the patient after these medicines showed that the patient    stayed the same   Reevaluation: After the interventions noted above, I reevaluated the patient and found that they have :improved  Complexity of problems addressed: Patient's presentation is most consistent with  acute presentation with potential threat to life or bodily function  Disposition: After consideration of the diagnostic results and the patient's response to treatment,  I feel that the patent would benefit from discharge   .           Final Clinical Impression(s) / ED Diagnoses Final diagnoses:  Precordial pain    Rx / DC Orders ED Discharge Orders          Ordered    Ambulatory referral to Cardiology  Comments: If you have not heard from the Cardiology office within the next 72 hours please call [phone].   02/07/22 3010              Ripley Fraise, MD 02/07/22 [phone]

## 2022-02-07 NOTE — Discharge Instructions (Signed)

## 2022-02-07 NOTE — ED Triage Notes (Signed)
Pt c/o sudden mid chest pain that woke him up at about 1am this morning. Pt states pain shot across his chest and down both arms. Pt denies any pain at this time. States he has been belching a lot since the pain woke him up.

## 2022-02-09 NOTE — Progress Notes (Unsigned)
Cardiology Office Note   Date:  02/11/2022   ID:  Steven Glover, DOB [birth year], MRN [id number]  PCP:  Kathyrn Drown, MD  Cardiologist:   Dorris Carnes, MD   Pt referred after ER visit for CP     History of Present Illness: Steven Glover is a 71 y.o. male with a history of chest pain   he was seen in [facility] ED on 9.17.23.    Pt woke up with CP /pressure   Radiated to arms and mild back pain   Diaphoretic.   NO SOB   Back to baseline  The pt is usually very active      Notessome palpitatins atnt lightheadedness wit hactivty   Woke up Sun moring   1:30 AM  Chest, arms, back   Belching  Felt great the day before   Went to at 10   Week prior had mowed yard, finished building at [employer]     Since then he has had no further spellls    Outside did some walking   SInce Sunday   Get up in moring   bad taste    No change in food  Esophagus dilated a long time ago   NO problems since No coughing at night     NO bad taste in mouth when woke up Sunday night    Current Meds  Medication Sig   acetaminophen (TYLENOL) 325 MG tablet Take 2 tablets (650 mg total) by mouth every 6 (six) hours as needed.   aspirin EC 81 MG tablet Take 1 tablet (81 mg total) by mouth daily. Swallow whole.   cetirizine (ZYRTEC) 5 MG tablet Take 1 tablet (5 mg total) by mouth daily.   ezetimibe (ZETIA) 10 MG tablet Take 1 tablet (10 mg total) by mouth daily.   fexofenadine (ALLEGRA) 180 MG tablet Take 180 mg by mouth daily as needed for allergies.   folic acid (FOLVITE) 1 MG tablet Take 1 mg by mouth daily.     Golimumab ([place] ARIA IV) Inject into the vein.   methotrexate 2.5 MG tablet Take 17.5 mg by mouth every Monday. 7 tablets-Monday   metoprolol tartrate (LOPRESSOR) 100 MG tablet Take 1 tablet (100 mg total) by mouth as directed. Take 2 hours prior to CT Scan   omeprazole (PRILOSEC) 20 MG capsule Take 1 capsule (20 mg total) by mouth 2 (two) times daily before a meal.     Allergies:   Cefprozil  and Statins   Past Medical History:  Diagnosis Date   Acid reflux disease    Allergy    Asthma    as child   BMI between 19-24,adult JUL 2012 191.8 LBS   Collagen vascular disease ([place])    GERD (gastroesophageal reflux disease)    Internal hemorrhoids with other complication    TCS DEC 0998   Pulmonary nodule 2013   [facility] - Repeat 08/2012   Rheumatoid arthritis(714.0)     Past Surgical History:  Procedure Laterality Date   COLONOSCOPY  2009 NUR [facility]   MULTIPLE SIMPLE ADENOMAS   COLONOSCOPY N/A 05/02/2017   Dr. Oneida Alar: 4 simple adenomas removed, diverticulosis.  Next colonoscopy in 3 years.   COLONOSCOPY W/ POLYPECTOMY  2010 NUR   COLONOSCOPY WITH PROPOFOL  05/15/2012   SLF:Two sessile polyps ranging between 3-57m in size were found in the ascending colon and rectum; multiple biopsies were performed/Mild diverticulosis was noted in the sigmoid colon/The colon mucosa was otherwise normal/ Moderate sized internal  hemorrhoids. TCS 04/2017.   COLONOSCOPY WITH PROPOFOL N/A 04/29/2020   Procedure: COLONOSCOPY WITH PROPOFOL;  Surgeon: Eloise Harman, DO;  [facility];  Service: Endoscopy;  Laterality: N/A;  7:30am   ESOPHAGOGASTRODUODENOSCOPY  01/01/2011 PI:RJJO CHRONIC GASTRITIS   SLF: Hiatal Hernia/mild gastritis/stricture in the distal esophagus   FOOT SURGERY     POLYPECTOMY  05/15/2012   SIMPLE ADENOMA(2)   POLYPECTOMY  04/29/2020   Procedure: POLYPECTOMY;  Surgeon: Eloise Harman, DO;  Location[facility];  Service: Endoscopy;;   SAVORY DILATION  01/01/2011   Procedure: SAVORY DILATION;  Surgeon: Dorothyann Peng, MD;  Location[facility];  Service: Endoscopy;  Laterality: N/A;   SHOULDER OPEN ROTATOR CUFF REPAIR  2005   WRIST FUSION       Social History:  The patient  reports that he has never smoked. He has never used smokeless tobacco. He reports that he does not drink alcohol and does not use drugs.   Family History:  The patient's family history  includes Cancer in his brother and sister; Congestive Heart Failure in his brother; Diabetes in his father.    ROS:  Please see the history of present illness. All other systems are reviewed and  Negative to the above problem except as noted.    PHYSICAL EXAM: VS:  BP 118/68   Pulse 66   Ht '6\' 3"'$  (1.905 m)   Wt 203 lb 12.8 oz (92.4 kg)   SpO2 97%   BMI 25.47 kg/m   GEN: Well nourished, well developed, in no acute distress  HEENT: normal  Neck: no JVD, carotid bruits Cardiac: RRR; no murmurs    No LE edema  Respiratory:  clear to auscultation bilaterally GI: soft, nontender, nondistended, + BS  No hepatomegaly  MS: no deformity Moving all extremities   Skin: warm and dry, no rash Neuro:  Strength and sensation are intact Psych: euthymic mood, full affect   EKG:  EKG is not ordered today On 02/09/22   NSR 63       Lipid Panel    Component Value Date/Time   CHOL 127 06/03/2021 0850   TRIG 150 (H) 06/03/2021 0850   HDL 30 (L) 06/03/2021 0850   CHOLHDL 4.2 06/03/2021 0850   CHOLHDL 5.7 03/28/2013 0739   VLDL 43 (H) 03/28/2013 0739   LDLCALC 71 06/03/2021 0850    Wt Readings from Last 3 Encounters:  02/11/22 203 lb 12.8 oz (92.4 kg)  02/07/22 205 lb (93 kg)  06/12/21 205 lb (93 kg)      ASSESSMENT AND PLAN:  1  Chest pain   Pt had profound presentation   Woke up with severe pain.   ALso noted some belching after     CT scan showed mild calcifications  No dissection.  He was doing OK prior, active   Now he  has cut back on activity   Worried about what it is    CT also showed possible inflammation in RLQ     He denies pains there   Exam in unremarkable Belching susp for UGI and Reflux         Recomm:  CT coronary angiogram to r/o CAD INcrease omeprazole to BID    Will reivew with  S [place]  2  LIpids    PT intolerant to statins    LDL 71   HDL 30  Trig 150   Currently on Zetia   Discussed diet  Activiites as toelrated for now         Current medicines  are reviewed at length with the patient today.  The patient does not have concerns regarding medicines.  Signed, Dorris Carnes, MD  02/11/2022 9:04 PM    [address], [place], [place]  [address] Phone: [phone]; Fax: [phone]

## 2022-02-11 ENCOUNTER — Ambulatory Visit (INDEPENDENT_AMBULATORY_CARE_PROVIDER_SITE_OTHER): Payer: Medicare Other | Admitting: Internal Medicine

## 2022-02-11 ENCOUNTER — Encounter: Payer: Self-pay | Admitting: Internal Medicine

## 2022-02-11 ENCOUNTER — Other Ambulatory Visit
Admission: RE | Admit: 2022-02-11 | Discharge: 2022-02-11 | Disposition: A | Discharge status: Home / Self Care | Payer: Medicare Other | Source: Ambulatory Visit | Attending: Internal Medicine | Admitting: Internal Medicine

## 2022-02-11 VITALS — BP 118/68 | HR 66 | Ht 75.0 in | Wt 203.8 lb

## 2022-02-11 DIAGNOSIS — Z79899 Other long term (current) drug therapy: Secondary | ICD-10-CM | POA: Diagnosis present

## 2022-02-11 DIAGNOSIS — R072 Precordial pain: Secondary | ICD-10-CM

## 2022-02-11 LAB — BASIC METABOLIC PANEL
Anion gap: 4 — ABNORMAL LOW (ref 5–15)
BUN: 20 mg/dL (ref 8–23)
CO2: 25 mmol/L (ref 22–32)
Calcium: 9.3 mg/dL (ref 8.9–10.3)
Chloride: 108 mmol/L (ref 98–111)
Creatinine, Ser: 1.04 mg/dL (ref 0.61–1.24)
GFR, Estimated: >60 mL/min (ref >60)
Glucose, Bld: 85 mg/dL (ref 70–99)
Potassium: 4.2 mmol/L (ref 3.5–5.1)
Sodium: 137 mmol/L (ref 135–145)

## 2022-02-11 MED ORDER — OMEPRAZOLE 20 MG PO CPDR: 20.0000 mg | DELAYED_RELEASE_CAPSULE | Freq: Two times a day (BID) | ORAL | 11 refills | Status: DC

## 2022-02-11 MED ORDER — METOPROLOL TARTRATE 100 MG PO TABS: 100.0000 mg | ORAL_TABLET | ORAL | 0 refills | Status: DC

## 2022-02-11 MED ORDER — ASPIRIN 81 MG PO TBEC: 81.0000 mg | DELAYED_RELEASE_TABLET | Freq: Every day | ORAL | 12 refills | Status: DC

## 2022-02-11 NOTE — Patient Instructions (Addendum)
Medication Instructions:   Start Aspirin 81 mg Daily  Increase Omeprazole to 20 mg Two Times Daily  Take Lopressor 100 mg 2 hours prior to CT Scan   *If you need a refill on your cardiac medications before your next appointment, please call your pharmacy*   Lab Work: Your physician recommends that you return for lab work in: Today   If you have labs (blood work) drawn today and your tests are completely normal, you will receive your results only by: [place] (if you have MyChart) OR A paper copy in the mail If you have any lab test that is abnormal or we need to change your treatment, we will call you to review the results.   Testing/Procedures: Coronary CTA scan   Follow-Up: At [facility], you and your health needs are our priority.  As part of our continuing mission to provide you with exceptional heart care, we have created designated Provider Care Teams.  These Care Teams include your primary Cardiologist (physician) and Advanced Practice Providers (APPs -  Physician Assistants and Nurse Practitioners) who all work together to provide you with the care you need, when you need it.  We recommend signing up for the patient portal called "MyChart".  Sign up information is provided on this After Visit Summary.  MyChart is used to connect with patients for Virtual Visits (Telemedicine).  Patients are able to view lab/test results, encounter notes, upcoming appointments, etc.  Non-urgent messages can be sent to your provider as well.   To learn more about what you can do with MyChart, go to [email or web address].    Your next appointment:    To Be Determined   The format for your next appointment:   In Person  Provider:   Dorris Carnes, MD    Other Instructions Thank you for choosing [facility]!     Your cardiac CT will be scheduled at one of the below locations:   [facility] [address] [place], [place] [address] [phone]  OR  [facility] [address] [place], [place] [address] [phone]  [facility] [place],  [address] [phone]  If scheduled at [facility], please arrive at the [facility] and Children's Entrance (Entrance C2) of [facility] [address] 30 minutes prior to test start time. You can use the FREE valet parking offered at entrance C (encouraged to control the heart rate for the test)  Proceed to the [facility] Radiology Department (first floor) to check-in and test prep.  All radiology patients and guests should use entrance C2 at [facility], accessed from [facility], even though the hospital's physical address listed is [address]    If scheduled at [facility] or [facility], please arrive 15 mins early for check-in and test prep.   Please follow these instructions carefully (unless otherwise directed):  Hold all erectile dysfunction medications at least 3 days (72 hrs) prior to test. (Ie viagra, cialis, sildenafil, tadalafil, etc) We will administer nitroglycerin during this exam.   On the Night Before the Test: Be sure to Drink plenty of water. Do not consume any caffeinated/decaffeinated beverages or chocolate 12 hours prior to your test. Do not take any antihistamines 12 hours prior to your test. On the Day of the Test: Drink plenty of water until 1 hour prior to the test. You may  take your regular medications prior to the test.  Take metoprolol (Lopressor) two hours prior to test. HOLD Furosemide/Hydrochlorothiazide morning of the test.  *For Clinical Staff only. Please instruct patient the following:* Heart Rate Medication Recommendations for Cardiac CT  Resting HR < 50 bpm  No medication  Resting HR 50-60 bpm and BP >110/50 mmHG   Consider Metoprolol tartrate 25 mg PO  90-120 min prior to scan  Resting HR 60-65 bpm and BP >110/50 mmHG  Metoprolol tartrate 50 mg PO 90-120 minutes prior to scan   Resting HR > 65 bpm and BP >110/50 mmHG  Metoprolol tartrate 100 mg PO 90-120 minutes prior to scan  Consider Ivabradine 10-15 mg PO or a calcium channel blocker for resting HR >60 bpm and contraindication to metoprolol tartrate  Consider Ivabradine 10-15 mg PO in combination with metoprolol tartrate for HR >80 bpm         After the Test: Drink plenty of water. After receiving IV contrast, you may experience a mild flushed feeling. This is normal. On occasion, you may experience a mild rash up to 24 hours after the test. This is not dangerous. If this occurs, you can take Benadryl 25 mg and increase your fluid intake. If you experience trouble breathing, this can be serious. If it is severe call 911 IMMEDIATELY. If it is mild, please call our office. If you take any of these medications: Glipizide/Metformin, Avandament, Glucavance, please do not take 48 hours after completing test unless otherwise instructed.  We will call to schedule your test 2-4 weeks out understanding that some insurance companies will need an authorization prior to the service being performed.   For non-scheduling related questions, please contact the cardiac imaging nurse navigator should you have any questions/concerns: Steven Glover, Cardiac Imaging Nurse Navigator Steven Glover, Cardiac Imaging Nurse Navigator [facility] Direct Office Dial: [phone]   For scheduling needs, including cancellations and rescheduling, please call Steven Glover, [phone].   Important Information About Sugar

## 2022-02-19 ENCOUNTER — Ambulatory Visit (INDEPENDENT_AMBULATORY_CARE_PROVIDER_SITE_OTHER): Payer: Medicare Other | Admitting: Family Medicine

## 2022-02-19 ENCOUNTER — Encounter: Payer: Self-pay | Admitting: Family Medicine

## 2022-02-19 VITALS — BP 120/78 | Wt 205.8 lb

## 2022-02-19 DIAGNOSIS — I728 Aneurysm of other specified arteries: Secondary | ICD-10-CM

## 2022-02-19 DIAGNOSIS — J431 Panlobular emphysema: Secondary | ICD-10-CM

## 2022-02-19 DIAGNOSIS — I700 Atherosclerosis of aorta: Secondary | ICD-10-CM | POA: Diagnosis not present

## 2022-02-19 DIAGNOSIS — Z23 Encounter for immunization: Secondary | ICD-10-CM | POA: Diagnosis not present

## 2022-02-19 NOTE — Progress Notes (Signed)
   Subjective:    Patient ID: Steven Glover, male    DOB: 10/31/1950, 71 y.o.   MRN: [id number]  HPI Pt arrives for hospital follow up. Pt was in [place] ED on 02/07/22. Pt went to ED due to chest pain and real bad belching. Pt wife states pt was having pain in arms.   Xray complete and CT Angio completed. Pt was d/c from ER due to daughter being a nurse and stated that she would keep an eye on him.   Pt states he feels good today. Pt has appt next Friday for CT Coronary. Seen Dr.Ross Cardioly on 02/11/22  Need for vaccination - Plan: Flu Vaccine QUAD High Dose(Fluad)   Review of Systems     Objective:   Physical Exam General-in no acute distress Eyes-no discharge Lungs-respiratory rate normal, CTA CV-no murmurs,RRR Extremities skin warm dry no edema Neuro grossly normal Behavior normal, alert        Assessment & Plan:   1. Need for vaccination Today - Flu Vaccine QUAD High Dose(Fluad)  2. Splenic artery aneurysm ([place]) Annual imaging Emphysema-not causing him any shortness of breath Does not smoke Its only 1 small area shown on a CAT scan No further work-up necessary currently  4. Aortic atherosclerosis (H[facility]) Trace amounts-patient has coronary scan coming up hopefully this will give further details on how to proceed forward Patient will follow-up in approximately 3 months  Area of mesenteric inflammation that was seen in the mesentery area hard to know if this is just a spurious finding or if this is a significant finding hold off on any additional tests right now touch base with his gastroenterologist Dr. Abbey Chatters

## 2022-02-20 ENCOUNTER — Telehealth: Payer: Self-pay | Admitting: Internal Medicine

## 2022-02-20 NOTE — Telephone Encounter (Signed)
Can we see if this patient can come see me in the office on Wednesday at 1400?  Okay to create a spot for him.  Diagnosis abnormal CT scan.  Thank you

## 2022-02-22 ENCOUNTER — Other Ambulatory Visit: Payer: Self-pay | Admitting: Family Medicine

## 2022-02-22 DIAGNOSIS — R9389 Abnormal findings on diagnostic imaging of other specified body structures: Secondary | ICD-10-CM

## 2022-02-22 NOTE — Progress Notes (Signed)
Reminder placed in reminder file  

## 2022-02-25 ENCOUNTER — Telehealth (HOSPITAL_COMMUNITY): Payer: Self-pay | Admitting: *Deleted

## 2022-02-25 NOTE — Telephone Encounter (Signed)
Reaching out to patient to offer assistance regarding upcoming cardiac imaging study; pt verbalizes understanding of appt date/time, parking situation and where to check in, medications ordered, and verified current allergies; name and call back number provided for further questions should they arise  Gordy Clement RN Navigator Cardiac Imaging [facility] [phone] office [phone] cell  Patient to take '50mg'$  metoprolol tartrate two hours prior to his cardiac CT scan.  He is aware to arrive at 8:30am.

## 2022-02-25 NOTE — Telephone Encounter (Signed)
Received referral from epic.  Called pt. Scheduled with Magda Paganini 10/9 at 11:30am. Pt aware of location.

## 2022-02-26 ENCOUNTER — Ambulatory Visit (HOSPITAL_COMMUNITY)
Admission: RE | Admit: 2022-02-26 | Discharge: 2022-02-26 | Disposition: A | Discharge status: Home / Self Care | Payer: Medicare Other | Source: Ambulatory Visit | Attending: Internal Medicine | Admitting: Internal Medicine

## 2022-02-26 DIAGNOSIS — R072 Precordial pain: Secondary | ICD-10-CM | POA: Diagnosis present

## 2022-02-26 MED ORDER — IOHEXOL 350 MG/ML SOLN
100.0000 mL | Freq: Once | INTRAVENOUS | Status: AC | PRN
  Administered 2022-02-26: 100 mL via INTRAVENOUS

## 2022-02-26 MED ORDER — NITROGLYCERIN 0.4 MG SL SUBL: 0.8000 mg | SUBLINGUAL_TABLET | Freq: Once | SUBLINGUAL | Status: AC

## 2022-02-26 MED ORDER — NITROGLYCERIN 0.4 MG SL SUBL
SUBLINGUAL_TABLET | SUBLINGUAL | Status: AC
  Administered 2022-02-26: 0.8 mg via SUBLINGUAL
  Filled 2022-02-26: qty 2

## 2022-02-28 NOTE — Progress Notes (Unsigned)
GI Office Note    Referring Provider: Kathyrn Drown, MD Primary Care Physician:  Kathyrn Drown, MD  Primary Gastroenterologist:  Chief Complaint   No chief complaint on file.    History of Present Illness   Steven Glover is a 71 y.o. male presenting today     Colonoscopy 04/2020: - Preparation of the colon was fair. - Non-bleeding internal hemorrhoids. - Diverticulosis in the sigmoid colon and in the descending colon. - Two 3 to 5 mm polyps in the transverse colon and in the ascending colon, removed with a cold snare. Resected and retrieved. - One 2 mm polyp in the transverse colon, removed with a cold biopsy forceps. Resected and retrieved. -tubular adenoma on path -next colonoscopy 5 years    Medications   Current Outpatient Medications  Medication Sig Dispense Refill   acetaminophen (TYLENOL) 325 MG tablet Take 2 tablets (650 mg total) by mouth every 6 (six) hours as needed. 30 tablet 0   aspirin EC 81 MG tablet Take 1 tablet (81 mg total) by mouth daily. Swallow whole. 30 tablet 12   cetirizine (ZYRTEC) 5 MG tablet Take 1 tablet (5 mg total) by mouth daily. 30 tablet 0   ezetimibe (ZETIA) 10 MG tablet Take 1 tablet (10 mg total) by mouth daily. 30 tablet 12   fexofenadine (ALLEGRA) 180 MG tablet Take 180 mg by mouth daily as needed for allergies.     folic acid (FOLVITE) 1 MG tablet Take 1 mg by mouth daily.       Golimumab ([place] ARIA IV) Inject into the vein.     methotrexate 2.5 MG tablet Take 17.5 mg by mouth every Monday. 7 tablets-Monday     metoprolol tartrate (LOPRESSOR) 100 MG tablet Take 1 tablet (100 mg total) by mouth as directed. Take 2 hours prior to CT Scan 1 tablet 0   omeprazole (PRILOSEC) 20 MG capsule Take 1 capsule (20 mg total) by mouth 2 (two) times daily before a meal. 60 capsule 11   No current facility-administered medications for this visit.    Allergies   Allergies as of 03/01/2022 - Review Complete 02/26/2022   Allergen Reaction Noted   Cefprozil  02/21/2015   Statins  11/30/2018    Past Medical History   Past Medical History:  Diagnosis Date   Acid reflux disease    Allergy    Asthma    as child   BMI between 19-24,adult JUL 2012 191.8 LBS   Collagen vascular disease ([place])    GERD (gastroesophageal reflux disease)    Internal hemorrhoids with other complication    TCS DEC 6270   Pulmonary nodule 2013   [facility] - Repeat 08/2012   Rheumatoid arthritis(714.0)     Past Surgical History   Past Surgical History:  Procedure Laterality Date   COLONOSCOPY  2009 NUR [facility]   MULTIPLE SIMPLE ADENOMAS   COLONOSCOPY N/A 05/02/2017   Dr. Oneida Alar: 4 simple adenomas removed, diverticulosis.  Next colonoscopy in 3 years.   COLONOSCOPY W/ POLYPECTOMY  2010 NUR   COLONOSCOPY WITH PROPOFOL  05/15/2012   SLF:Two sessile polyps ranging between 3-28m in size were found in the ascending colon and rectum; multiple biopsies were performed/Mild diverticulosis was noted in the sigmoid colon/The colon mucosa was otherwise normal/ Moderate sized internal hemorrhoids. TCS 04/2017.   COLONOSCOPY WITH PROPOFOL N/A 04/29/2020   Procedure: COLONOSCOPY WITH PROPOFOL;  Surgeon: CEloise Harman DO;  [facility];  Service: Endoscopy;  Laterality: N/A;  7:30am   ESOPHAGOGASTRODUODENOSCOPY  01/01/2011 JM:EQAS CHRONIC GASTRITIS   SLF: Hiatal Hernia/mild gastritis/stricture in the distal esophagus   FOOT SURGERY     POLYPECTOMY  05/15/2012   SIMPLE ADENOMA(2)   POLYPECTOMY  04/29/2020   Procedure: POLYPECTOMY;  Surgeon: Eloise Harman, DO;  Location: [facility];  Service: Endoscopy;;   SAVORY DILATION  01/01/2011   Procedure: SAVORY DILATION;  Surgeon: Dorothyann Peng, MD;  Location: [facility];  Service: Endoscopy;  Laterality: N/A;   SHOULDER OPEN ROTATOR CUFF REPAIR  2005   WRIST FUSION      Past Family History   Family History  Problem Relation Age of Onset   Diabetes Father    Cancer Sister         breast   Cancer Brother    Congestive Heart Failure Brother    Colon cancer Neg Hx    Colon polyps Neg Hx     Past Social History   Social History   Socioeconomic History   Marital status: Married    Spouse name: Not on file   Number of children: Not on file   Years of education: Not on file   Highest education level: Not on file  Occupational History   Not on file  Tobacco Use   Smoking status: Never   Smokeless tobacco: Never  Vaping Use   Vaping Use: Never used  Substance and Sexual Activity   Alcohol use: No   Drug use: No   Sexual activity: Yes    Birth control/protection: None  Other Topics Concern   Not on file  Social History Narrative   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 Eyes: Negative for vision changes.  ENT: Negative for hoarseness, difficulty swallowing , nasal congestion. CV: Negative for chest pain, angina, palpitations, dyspnea on exertion, peripheral edema.  Respiratory: Negative for dyspnea at rest, dyspnea on exertion, cough, sputum, wheezing.  GI: See history of present illness. GU:  Negative for dysuria, hematuria, urinary incontinence, urinary frequency, nocturnal urination.  MS: Negative for joint pain, low back pain.  Derm: Negative for rash or itching.  Neuro: Negative for weakness, abnormal sensation, seizure, frequent headaches, memory loss,  confusion.  Psych: Negative for anxiety, depression, suicidal ideation, hallucinations.  Endo: Negative for unusual weight change.  Heme: Negative for bruising or bleeding. Allergy: Negative for rash or  hives.  Physical Exam   There were no vitals taken for this visit.   General: Well-nourished, well-developed in no acute distress.  Head: Normocephalic, atraumatic.   Eyes: Conjunctiva pink, no icterus. Mouth: Oropharyngeal mucosa moist and pink , no lesions erythema or exudate. Neck: Supple without thyromegaly, masses, or lymphadenopathy.  Lungs: Clear to auscultation bilaterally.  Heart: Regular rate and rhythm, no murmurs rubs or gallops.  Abdomen: Bowel sounds are normal, nontender, nondistended, no hepatosplenomegaly or masses,  no abdominal bruits or hernia, no rebound or guarding.   Rectal: *** Extremities: No lower extremity edema. No clubbing or deformities.  Neuro: Alert and oriented x 4 , grossly normal neurologically.  Skin: Warm and dry, no  rash or jaundice.   Psych: Alert and cooperative, normal mood and affect.  Labs   Lab Results  Component Value Date   CREATININE 1.04 02/11/2022   BUN 20 02/11/2022   NA 137 02/11/2022   K 4.2 02/11/2022   CL 108 02/11/2022   CO2 25 02/11/2022   Lab Results  Component Value Date   ALT 29 02/07/2022   AST 29 02/07/2022   ALKPHOS 72 02/07/2022   BILITOT 0.6 02/07/2022   Lab Results  Component Value Date   LIPASE 30 02/07/2022   Lab Results  Component Value Date   WBC 4.1 02/07/2022   HGB 15.0 02/07/2022   HCT 41.4 02/07/2022   MCV 94.3 02/07/2022   PLT 134 (L) 02/07/2022    Imaging Studies   CT CORONARY MORPH W/CTA COR W/SCORE W/CA W/CM &/OR WO/CM  Addendum Date: 02/26/2022   ADDENDUM REPORT: 02/26/2022 12:38 EXAM: OVER-READ INTERPRETATION CARDIAC CT CHEST The following report is an over-read performed by radiologist Dr. Van Clines of [facility] on 02/26/2022. This over-read does not include interpretation of cardiac or coronary anatomy or pathology. The coronary CTA interpretation by the cardiologist is attached. COMPARISON:  02/07/2022 FINDINGS: Extracardiac Vascular: Unremarkable  Mediastinum: Moderate-sized hiatal hernia. Calcified mediastinal lymph nodes compatible with old granulomatous disease. Lung: Stable scarring in the lingula and right middle lobe. Emphysema. Stable peripheral reticular interstitial accentuation. Included Upper Abdomen: Unremarkable Musculoskeletal: Mild thoracic spondylosis. IMPRESSION: 1. Moderate-sized hiatal hernia. 2. Stable scarring in the lingula and right middle lobe. 3. Emphysema. 4. Stable peripheral reticular interstitial accentuation in the lungs, likely from chronic interstitial lung disease. 5. Calcified mediastinal lymph nodes compatible with old granulomatous disease. Emphysema (ICD10-J43.9). Electronically Signed   By: Van Clines M.D.   On: 02/26/2022 12:38   Result Date: 02/26/2022 CLINICAL DATA:  Chest pain EXAM: Cardiac CTA MEDICATIONS: Sub lingual nitro. '4mg'$  and lopressor '50mg'$  TECHNIQUE: The patient was scanned on a Siemens Force 681 slice scanner. Gantry rotation speed was 250 msecs. Collimation was .6 mm. A 100 kV prospective scan was triggered in the ascending thoracic aorta at 140 HU's Full mA was used between 35% and 75% of the R-R interval. Average HR during the scan was 58 bpm. The 3D data set was interpreted on a dedicated work station using MPR, MIP and VRT modes. A total of 80cc of contrast was used. FINDINGS: Non-cardiac: See separate report from [facility]. No significant findings on limited lung and soft tissue windows. Calcium Score: Noted on LAD Coronary Arteries: Right dominant with no anomalies LM: Normal LAD: 1-24% calcified plaque in ostial LAD and proximal vessel IM: Large vessel normal D1: Normal D2: Normal Circumflex: Normal OM1: Normal OM2: Normal RCA: Normal PDA: Normal PLA: Normal IMPRESSION: 1.  Normal ascending thoracic aorta 3.6 cm 2.  Calcium score 38.2 which is only 25 th percentile for age / sex 29.  CAD RADS 1 non obstrucitve CAD see description above Jenkins Rouge Electronically Signed: By: Jenkins Rouge M.D. On: 02/26/2022 10:37   CT Angio Chest/Abd/Pel for Dissection W and/or Wo Contrast  Result Date: 02/07/2022 CLINICAL DATA:  Acute aortic syndrome suspected. Sudden onset chest pain EXAM: CT ANGIOGRAPHY CHEST, ABDOMEN AND PELVIS TECHNIQUE: Non-contrast CT of the chest was initially obtained. Multidetector CT imaging through the chest, abdomen and pelvis was performed using the standard protocol during bolus administration of intravenous contrast. Multiplanar reconstructed images and MIPs were obtained and reviewed to evaluate the vascular anatomy. RADIATION DOSE REDUCTION: This exam was  performed according to the departmental dose-optimization program which includes automated exposure control, adjustment of the mA and/or kV according to patient size and/or use of iterative reconstruction technique. CONTRAST:  143m OMNIPAQUE IOHEXOL 350 MG/ML SOLN COMPARISON:  CT scan of the chest 01/11/2011 FINDINGS: CTA CHEST FINDINGS Cardiovascular: No evidence of high attenuation within the aortic media on the initial noncontrast enhanced images to suggest the presence of an acute intramural hematoma. Following administration of intravenous contrast, there is excellent opacification of the aorta and branch vessels. Conventional 3 vessel arch. The aortic root, ascending, transverse and descending thoracic aorta are normal in caliber. No evidence of aneurysm. No significant atherosclerotic plaque. The main pulmonary artery is normal in size. The heart is normal in size. Trace calcifications visualized along the coronary arteries. No pericardial effusion. Mediastinum/Nodes: Normal appearance of the thyroid gland. No suspicious mediastinal or hilar adenopathy. Moderately large sliding hiatal hernia. Lungs/Pleura: Mild biapical pleuroparenchymal scarring appears unchanged compared to prior imaging from 2012. Small scattered pulmonary nodules in the right middle lobe and along the right major fissure are also unchanged  dating back to 2012 consistent with benign findings. Minimal emphysematous changes in the inferior aspect of the lingula and left lower lobe. No pneumothorax or pleural effusion. Musculoskeletal: No acute fracture or aggressive appearing lytic or blastic osseous lesion. Review of the MIP images confirms the above findings. CTA ABDOMEN AND PELVIS FINDINGS VASCULAR Aorta: Normal caliber aorta without dissection or aneurysm. Celiac: Celiac artery origin is widely patent. No significant plaque or stenosis. A small aneurysm is present in the splenic artery distally near the hilum. The aneurysm measures up to 1 cm in diameter. There is partial peripheral calcification SMA: Patent without evidence of aneurysm, dissection, vasculitis or significant stenosis. Renals: Both renal arteries are patent without evidence of aneurysm, dissection, vasculitis, fibromuscular dysplasia or significant stenosis. IMA: Patent without evidence of aneurysm, dissection, vasculitis or significant stenosis. Inflow: Patent without evidence of aneurysm, dissection, vasculitis or significant stenosis. Veins: No obvious venous abnormality within the limitations of this arterial phase study. Review of the MIP images confirms the above findings. NON-VASCULAR Hepatobiliary: No focal liver abnormality is seen. No gallstones, gallbladder wall thickening, or biliary dilatation. Pancreas: Unremarkable. No pancreatic ductal dilatation or surrounding inflammatory changes. Spleen: Normal in size without focal abnormality. Adrenals/Urinary Tract: Adrenal glands are unremarkable. Kidneys are normal, without renal calculi, focal lesion, or hydronephrosis. Bladder is unremarkable. Stomach/Bowel: Colonic diverticular disease without CT evidence of active inflammation. Moderately large volume of stool in the colon and rectum suggests constipation. The appendix is normal. There is an inflammatory process within the ileocolic mesentery in the right lower quadrant.  The loops of small bowel are closely adherent. There is a suggestion of either a Meckel's diverticulum or enteroenteric fistula with associated inflammatory stranding in the adjacent mesentery. Lymphatic: No suspicious lymphadenopathy. Reproductive: Prostatomegaly. Other: No abdominal wall hernia or abnormality. No abdominopelvic ascites. Musculoskeletal: Mild grade 1 anterolisthesis of L4 on L5 measuring approximately 6 mm. L5-S1 degenerative disc disease. Review of the MIP images confirms the above findings. IMPRESSION: 1. No evidence of aortic dissection or other source for acute aortic syndrome. 2. Inflammatory process within the ileocolic mesentery of the right lower quadrant. Several loops of distal ileum and the terminal ileum are closely apposed and there is a suggestion of either an inflamed elongated Meckel's diverticulum, or an enteroenteric fistula. Inflammatory stranding is present within the mesentery but there is no sign of perforation or discrete abscess. 3. The appendix is well seen  and is normal. 4. Colonic diverticular disease without CT evidence of active inflammation. 5. 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 Consider follow-up CT arteriogram of the abdomen in 1-2 years to confirm stability. 6. Trace aortic and coronary artery atherosclerotic calcifications. 7. Mild grade 1 anterolisthesis of L4 on L5 with associated lower lumbar degenerative disc disease. 8. Mild emphysematous changes versus early honeycombing in the inferior aspects of the lingula and left lower lobe. 9. Prostatomegaly. Aortic Atherosclerosis (ICD10-I70.0) and Emphysema (ICD10-J43.9). Electronically Signed   By: Jacqulynn Cadet M.D.   On: 02/07/2022 06:36   DG Chest Port 1 View  Result Date: 02/07/2022 CLINICAL DATA:  Chest pain. EXAM: PORTABLE CHEST 1 VIEW COMPARISON:  01/05/2011, 01/11/2011. FINDINGS: The heart size and mediastinal contours are within normal limits. Mild atelectasis is noted at the lung  bases. No consolidation, effusion, or pneumothorax. A slightly nodular density is noted in the right upper lobe measuring 8 mm. No acute osseous abnormality. IMPRESSION: 1. No active disease. 2. 8 mm nodular density in the right upper lobe. CT is suggested for further evaluation on nonemergent follow-up. Electronically Signed   By: Brett Fairy M.D.   On: 02/07/2022 03:33    Assessment       PLAN   ***   Laureen Ochs. Bobby Rumpf, [place], [facility]

## 2022-03-01 ENCOUNTER — Encounter: Payer: Self-pay | Admitting: Gastroenterology

## 2022-03-01 ENCOUNTER — Telehealth: Payer: Self-pay | Admitting: Gastroenterology

## 2022-03-01 ENCOUNTER — Ambulatory Visit (INDEPENDENT_AMBULATORY_CARE_PROVIDER_SITE_OTHER): Payer: Medicare Other | Admitting: Gastroenterology

## 2022-03-01 DIAGNOSIS — R933 Abnormal findings on diagnostic imaging of other parts of digestive tract: Secondary | ICD-10-CM | POA: Insufficient documentation

## 2022-03-01 DIAGNOSIS — K449 Diaphragmatic hernia without obstruction or gangrene: Secondary | ICD-10-CM

## 2022-03-01 NOTE — Telephone Encounter (Signed)
Patient seen in the office today. He needs to have CT A/P with oral and IV contrast in 2-3 weeks (not sooner due to recent CTs). Dx: inflammatory process within ileocolic mesentery of RLQ, ?Meckel's diverticulum or enteroenteric fistula.

## 2022-03-01 NOTE — Patient Instructions (Addendum)
Take omeprazole '20mg'$  30 minutes before breakfast and before your evening meal. Let me know if you have persistent belching, bitter taste in your mouth.  We will be in touch to schedule your follow up CT scan of your abdomen. To discuss timing with Dr. Abbey Chatters given additional study done last week.  You will need to follow with PCP regarding splenic artery aneurysm, radiology recommending follow up in 1-2 years.

## 2022-03-01 NOTE — Addendum Note (Signed)
Addended by: Cheron Every on: 03/01/2022 01:51 PM   Modules accepted: Orders

## 2022-04-07 ENCOUNTER — Ambulatory Visit (HOSPITAL_COMMUNITY)
Admission: RE | Admit: 2022-04-07 | Discharge: 2022-04-07 | Disposition: A | Discharge status: Home / Self Care | Payer: Medicare Other | Source: Ambulatory Visit | Attending: Gastroenterology | Admitting: Gastroenterology

## 2022-04-07 DIAGNOSIS — R933 Abnormal findings on diagnostic imaging of other parts of digestive tract: Secondary | ICD-10-CM | POA: Insufficient documentation

## 2022-04-07 MED ORDER — IOHEXOL 300 MG/ML  SOLN
100.0000 mL | Freq: Once | INTRAMUSCULAR | Status: AC | PRN
  Administered 2022-04-07: 100 mL via INTRAVENOUS

## 2022-04-07 MED ORDER — IOHEXOL 9 MG/ML PO SOLN
ORAL | Status: AC
  Filled 2022-04-07: qty 1000

## 2022-06-01 ENCOUNTER — Other Ambulatory Visit: Payer: Self-pay

## 2022-06-01 MED ORDER — EZETIMIBE 10 MG PO TABS: 10.0000 mg | ORAL_TABLET | Freq: Every day | ORAL | 0 refills | Status: DC

## 2022-06-10 ENCOUNTER — Ambulatory Visit: Payer: Medicare Other | Admitting: Internal Medicine

## 2022-06-30 ENCOUNTER — Encounter (INDEPENDENT_AMBULATORY_CARE_PROVIDER_SITE_OTHER): Payer: Self-pay | Admitting: *Deleted

## 2022-06-30 ENCOUNTER — Ambulatory Visit (INDEPENDENT_AMBULATORY_CARE_PROVIDER_SITE_OTHER): Payer: Medicare Other | Admitting: Internal Medicine

## 2022-06-30 ENCOUNTER — Telehealth (INDEPENDENT_AMBULATORY_CARE_PROVIDER_SITE_OTHER): Payer: Self-pay | Admitting: *Deleted

## 2022-06-30 ENCOUNTER — Other Ambulatory Visit (INDEPENDENT_AMBULATORY_CARE_PROVIDER_SITE_OTHER): Payer: Self-pay | Admitting: *Deleted

## 2022-06-30 ENCOUNTER — Encounter: Payer: Self-pay | Admitting: Internal Medicine

## 2022-06-30 VITALS — BP 129/74 | HR 65 | Temp 97.7°F | Ht 75.0 in | Wt 208.3 lb

## 2022-06-30 DIAGNOSIS — K632 Fistula of intestine: Secondary | ICD-10-CM

## 2022-06-30 DIAGNOSIS — Q430 Meckel's diverticulum (displaced) (hypertrophic): Secondary | ICD-10-CM

## 2022-06-30 DIAGNOSIS — R933 Abnormal findings on diagnostic imaging of other parts of digestive tract: Secondary | ICD-10-CM

## 2022-06-30 DIAGNOSIS — K449 Diaphragmatic hernia without obstruction or gangrene: Secondary | ICD-10-CM

## 2022-06-30 DIAGNOSIS — K219 Gastro-esophageal reflux disease without esophagitis: Secondary | ICD-10-CM | POA: Diagnosis not present

## 2022-06-30 MED ORDER — PANTOPRAZOLE SODIUM 40 MG PO TBEC: 40.0000 mg | DELAYED_RELEASE_TABLET | Freq: Two times a day (BID) | ORAL | 3 refills | Status: DC

## 2022-06-30 NOTE — Patient Instructions (Signed)
We will schedule you for CT abdomen pelvis with oral and IV contrast to reevaluate the abnormal inflammation in the mesentery of your small bowel.  We will call with these results.  For your chronic reflux, I will switch you to pantoprazole 40 mg twice daily.  This medication works best if you take it 30 minutes before breakfast.  The nightly dose you can either take 30 minutes before dinner or at bedtime depending on your symptoms.  I want to do this for 12 weeks at which point you can decrease down to once daily as tolerated.  Follow-up in 6 months.  It was very nice seeing you again today.  Please give Thayer Headings my best.  Dr. Abbey Chatters

## 2022-06-30 NOTE — Progress Notes (Signed)
Referring Provider: Kathyrn Drown, MD Primary Care Physician:  Kathyrn Drown, MD Primary GI:  Dr. Abbey Chatters  Chief Complaint  Patient presents with   Results    Follow up on abnormal CT. Splenic artery aneurysm.     HPI:   Steven Glover is a 72 y.o. male who presents to clinic today for follow-up visit.  Patient was seen in the ED on September 17 after waking up chest pain radiating into both arms and into his back.  Also noted to have a lot of belching, increased from baseline.  Completed CTA chest/abdomen/pelvis which ruled out aortic dissection but several incidental findings noted.  Presents today for further evaluation of inflammatory process within the ileocolic mesentery of the right lower quadrant.  Several loops of distal ileum terminal closely opposed and suggested that either inflamed Meckel's diverticulum or enteroenteric fistula.  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  Moderately large sliding hiatal hernia noted as well.   I reviewed scans personally with Dr. Constance Haw of general surgery with plans to repeat CT scan in 2 months.  Repeat CT November 2023 showed slight improvement and fat stranding in the right lower quadrant small bowel mesentery consistent with resolving nonspecific infection or inflammation.  No visualized Meckel's diverticulum or enteroenteric fistula.  Patient is completely asymptomatic.  Denies any abdominal pain.  No blood or mucus in his stools.  Normal bowel movements.  Does have chronic GERD for which she has been maintained on omeprazole 20 mg twice daily for many years.  Notes currently having breakthrough symptoms often.  No dysphagia odynophagia.  No epigastric or chest pain.  Avoids foods which trigger his symptoms. Has a large hiatal hernia  Past Medical History:  Diagnosis Date   Acid reflux disease    Allergy    Asthma    as child   BMI between 19-24,adult JUL 2012 191.8 LBS    Collagen vascular disease ([place])    GERD (gastroesophageal reflux disease)    Internal hemorrhoids with other complication    TCS DEC 4696   Pulmonary nodule 2013   [facility] - Repeat 08/2012   Rheumatoid arthritis(714.0)     Past Surgical History:  Procedure Laterality Date   COLONOSCOPY  2009 NUR [facility]   MULTIPLE SIMPLE ADENOMAS   COLONOSCOPY N/A 05/02/2017   Dr. Oneida Alar: 4 simple adenomas removed, diverticulosis.  Next colonoscopy in 3 years.   COLONOSCOPY W/ POLYPECTOMY  2010 NUR   COLONOSCOPY WITH PROPOFOL  05/15/2012   SLF:Two sessile polyps ranging between 3-5m in size were found in the ascending colon and rectum; multiple biopsies were performed/Mild diverticulosis was noted in the sigmoid colon/The colon mucosa was otherwise normal/ Moderate sized internal hemorrhoids. TCS 04/2017.   COLONOSCOPY WITH PROPOFOL N/A 04/29/2020   Procedure: COLONOSCOPY WITH PROPOFOL;  Surgeon: CEloise Harman DO;  [facility];  Service: Endoscopy;  Laterality: N/A;  7:30am   ESOPHAGOGASTRODUODENOSCOPY  01/01/2011 BEX:BMWUCHRONIC GASTRITIS   SLF: Hiatal Hernia/mild gastritis/stricture in the distal esophagus   FOOT SURGERY     POLYPECTOMY  05/15/2012   SIMPLE ADENOMA(2)   POLYPECTOMY  04/29/2020   Procedure: POLYPECTOMY;  Surgeon: CEloise Harman DO;  [facility];  Service: Endoscopy;;   SAVORY DILATION  01/01/2011   Procedure: SAVORY DILATION;  Surgeon: SDorothyann Peng MD;  [facility];  Service: Endoscopy;  Laterality: N/A;   SHOULDER OPEN ROTATOR CUFF REPAIR  2005   WRIST FUSION      Current Outpatient Medications  Medication Sig Dispense Refill   acetaminophen (TYLENOL) 325 MG tablet Take 2 tablets (650 mg total) by mouth every 6 (six) hours as needed. 30 tablet 0   aspirin EC 81 MG tablet Take 1 tablet (81 mg total) by mouth daily. Swallow whole. 30 tablet 12   ezetimibe (ZETIA) 10 MG tablet Take 1 tablet (10 mg total) by mouth daily. 90 tablet 0   folic  acid (FOLVITE) 1 MG tablet Take 1 mg by mouth daily.       Golimumab ([place] ARIA IV) Inject into the vein.     methotrexate 2.5 MG tablet Take 17.5 mg by mouth every Monday. 7 tablets-Monday     omeprazole (PRILOSEC) 20 MG capsule Take 1 capsule (20 mg total) by mouth 2 (two) times daily before a meal. 60 capsule 11   No current facility-administered medications for this visit.    Allergies as of 06/30/2022 - Review Complete 06/30/2022  Allergen Reaction Noted   Cefprozil  02/21/2015   Statins  11/30/2018    Family History  Problem Relation Age of Onset   Diabetes Father    Cancer Sister        breast   Cancer Brother        "intestines"   Congestive Heart Failure Brother    Colon cancer Neg Hx    Colon polyps Neg Hx     Social History   Socioeconomic History   Marital status: Married    Spouse name: Not on file   Number of children: Not on file   Years of education: Not on file   Highest education level: Not on file  Occupational History   Not on file  Tobacco Use   Smoking status: Never   Smokeless tobacco: Never  Vaping Use   Vaping Use: Never used  Substance and Sexual Activity   Alcohol use: No   Drug use: No   Sexual activity: Yes    Birth control/protection: None  Other Topics Concern   Not on file  Social History Narrative   MARRIED-Daughter, Chilton Si, nurse ([employer]), 2 CHILDREN   Social Determinants of Health   Financial Resource Strain: Not on file  Food Insecurity: Not on file  Transportation Needs: Not on file  Physical Activity: Not on file  Stress: Not on file  Social Connections: Not on file    Subjective: Review of Systems  Constitutional:  Negative for chills and fever.  HENT:  Negative for congestion and hearing loss.   Eyes:  Negative for blurred vision and double vision.  Respiratory:  Negative for cough and shortness of breath.   Cardiovascular:  Negative for chest pain and palpitations.  Gastrointestinal:  Positive for  heartburn. Negative for abdominal pain, blood in stool, constipation, diarrhea, melena and vomiting.  Genitourinary:  Negative for dysuria and urgency.  Musculoskeletal:  Negative for joint pain and myalgias.  Skin:  Negative for itching and rash.  Neurological:  Negative for dizziness and headaches.  Psychiatric/Behavioral:  Negative for depression. The patient is not nervous/anxious.      Objective: There were no vitals taken for this visit. Physical Exam Constitutional:      Appearance: Normal appearance.  HENT:     Head: Normocephalic and atraumatic.  Eyes:     Extraocular Movements: Extraocular movements intact.     Conjunctiva/sclera: Conjunctivae normal.  Cardiovascular:     Rate and Rhythm: Normal rate  and regular rhythm.  Pulmonary:     Effort: Pulmonary effort is normal.     Breath sounds: Normal breath sounds.  Abdominal:     General: Bowel sounds are normal.     Palpations: Abdomen is soft.  Musculoskeletal:        General: Normal range of motion.     Cervical back: Normal range of motion and neck supple.  Skin:    General: Skin is warm.  Neurological:     General: No focal deficit present.     Mental Status: He is alert and oriented to person, place, and time.  Psychiatric:        Mood and Affect: Mood normal.        Behavior: Behavior normal.      Assessment: *Chronic GERD-not well-controlled *Abnormal CT of the abdomen-mesenteric inflammation, questionable Meckel's diverticulum versus, questionable enteroenteric fistula  Plan: Patient's chronic GERD not well-controlled on omeprazole.  Will change to pantoprazole 40 mg twice daily for 12 weeks at which point he can decrease down to once daily as tolerated.  Most recent CT scan showed slight improvement of his mesenteric inflammation.  Discussed case further with Dr. Constance Haw of general surgery who recommended repeat CT scan to rule out occult malignancy.  We will order today.  Needs oral and IV contrast.   We will call patient with results.  Follow-up in 6 months.  06/30/2022 9:43 AM   Disclaimer: This note was dictated with voice recognition software. Similar sounding words can inadvertently be transcribed and may not be corrected upon review.

## 2022-06-30 NOTE — Telephone Encounter (Signed)
Called pt and spoke with spouse. Aware of CT appt details. She voiced understanding.

## 2022-07-02 ENCOUNTER — Encounter (HOSPITAL_COMMUNITY): Payer: Self-pay

## 2022-07-02 ENCOUNTER — Ambulatory Visit (HOSPITAL_COMMUNITY)
Admission: RE | Admit: 2022-07-02 | Discharge: 2022-07-02 | Disposition: A | Discharge status: Home / Self Care | Payer: Medicare Other | Source: Ambulatory Visit | Attending: Internal Medicine | Admitting: Internal Medicine

## 2022-07-02 DIAGNOSIS — R933 Abnormal findings on diagnostic imaging of other parts of digestive tract: Secondary | ICD-10-CM

## 2022-07-02 DIAGNOSIS — Q430 Meckel's diverticulum (displaced) (hypertrophic): Secondary | ICD-10-CM | POA: Diagnosis present

## 2022-07-02 DIAGNOSIS — K632 Fistula of intestine: Secondary | ICD-10-CM

## 2022-07-02 LAB — POCT I-STAT CREATININE: Creatinine, Ser: 1.1 mg/dL (ref 0.61–1.24)

## 2022-07-02 MED ORDER — IOHEXOL 300 MG/ML  SOLN
100.0000 mL | Freq: Once | INTRAMUSCULAR | Status: AC | PRN
  Administered 2022-07-02: 100 mL via INTRAVENOUS

## 2022-07-02 MED ORDER — IOHEXOL 9 MG/ML PO SOLN
ORAL | Status: AC
  Filled 2022-07-02: qty 1000

## 2022-07-08 ENCOUNTER — Telehealth: Payer: Self-pay

## 2022-07-08 NOTE — Telephone Encounter (Signed)
Pt phoned inquiring of the results from his CT scan. He states that he was advised that he would get a call in 24 hrs of the scan.

## 2022-07-08 NOTE — Telephone Encounter (Signed)
I called and discussed results with patient.  Further information on result note of CT scan report.

## 2022-07-09 ENCOUNTER — Other Ambulatory Visit: Payer: Self-pay | Admitting: *Deleted

## 2022-07-09 DIAGNOSIS — R9389 Abnormal findings on diagnostic imaging of other specified body structures: Secondary | ICD-10-CM

## 2022-07-09 NOTE — Telephone Encounter (Signed)
Noted both notes

## 2022-07-15 ENCOUNTER — Telehealth: Payer: Medicare Other | Admitting: Internal Medicine

## 2022-07-15 NOTE — Telephone Encounter (Signed)
Okay to refer to whichever urologist patient prefers thank you

## 2022-07-15 NOTE — Telephone Encounter (Signed)
This is a referral

## 2022-07-15 NOTE — Telephone Encounter (Signed)
Patient came in this morning and said that Dr. Abbey Glover was going to refer him to a Urologist.  I saw the referral in EPIC but Steven Glover has the name of another urologist that we would rather see and wondered if the referral could be sent to him instead.  He would like to go to Steven Glover - Steven Glover, [facility].  I told him that I would pass the information along and once that office receives the referral they should give him a call to schedule.

## 2022-07-16 ENCOUNTER — Other Ambulatory Visit: Payer: Self-pay | Admitting: *Deleted

## 2022-07-16 DIAGNOSIS — R9389 Abnormal findings on diagnostic imaging of other specified body structures: Secondary | ICD-10-CM

## 2022-07-16 NOTE — Telephone Encounter (Signed)
Referral sent 

## 2022-08-19 ENCOUNTER — Telehealth: Payer: Self-pay | Admitting: Internal Medicine

## 2022-08-19 NOTE — Telephone Encounter (Signed)
   Name: Steven Glover  DOB: 02-02-1951  MRN: [id number]  Primary Cardiologist: None   Preoperative team, please contact this patient and set up a phone call appointment for further preoperative risk assessment. Please obtain consent and complete medication review. Thank you for your help.  I confirm that guidance regarding antiplatelet and oral anticoagulation therapy has been completed and, if necessary, noted below.  Per office protocol, if patient is without any new symptoms or concerns at the time of their virtual visit, he may hold Aspirin for 5-7 days prior to procedure. Please resume Aspirin as soon as possible postprocedure, at the discretion of the surgeon.    Lenna Sciara, NP 08/19/2022, 11:01 AM [place]

## 2022-08-19 NOTE — Telephone Encounter (Signed)
Pt has in office appt with Diona Browner, NP 08/30/22. Will add need pre op clearance to appt notes. I will update all parties involved.

## 2022-08-19 NOTE — Telephone Encounter (Signed)
   Pre-operative Risk Assessment    Patient Name: Steven Glover  DOB: Sep 22, 1950 MRN: [id number]      Request for Surgical Clearance    Procedure:   Ureteroscopy and possible biopsy, resection of bladder tumor  Date of Surgery:  Clearance TBD                                 Surgeon:  Dr. Link Snuffer Surgeon's Group or Practice Name:  [facility] Phone number:  [phone]  Fax number:  [phone]   Type of Clearance Requested:   - Medical    Type of Anesthesia:  General    Additional requests/questions:   Caller stated they will need medical clearance as they tried to do this procedure yesterday and patient's HR shot up to 150.  Signed, Heloise Beecham   08/19/2022, 9:34 AM

## 2022-08-27 ENCOUNTER — Ambulatory Visit
Admission: EM | Admit: 2022-08-27 | Discharge: 2022-08-27 | Disposition: A | Discharge status: Home / Self Care | Payer: Medicare Other

## 2022-08-27 ENCOUNTER — Encounter: Payer: Self-pay | Admitting: Emergency Medicine

## 2022-08-27 DIAGNOSIS — J0141 Acute recurrent pansinusitis: Secondary | ICD-10-CM | POA: Diagnosis not present

## 2022-08-27 MED ORDER — DOXYCYCLINE HYCLATE 100 MG PO CAPS: 100.0000 mg | ORAL_CAPSULE | Freq: Two times a day (BID) | ORAL | 0 refills | Status: DC

## 2022-08-27 NOTE — Discharge Instructions (Signed)
We are covering you for a sinus infection.  Please start doxycycline 100 mg twice daily for 10 days.  Stay out of the sun while on this medication.  Continue over-the-counter medication including allergy medicine.  Use nasal saline and sinus rinses.  If your symptoms or not improving or if anything worsens please return for reevaluation.

## 2022-08-27 NOTE — ED Provider Notes (Signed)
[facility]    CSN: [id number] Arrival date & time: 08/27/22  1110      History   Chief Complaint No chief complaint on file.   HPI Steven Glover is a 72 y.o. male.   Patient presents today with a several day history of URI symptoms including nasal congestion, sinus pressure, headache, rhinorrhea.  Denies any cough, chest pain, shortness of breath, fever, nausea, vomiting.  He does have a history of seasonal allergies and has been taking antihistamines as previously prescribed.  He reports that his symptoms have significantly worsened in the past several days prompting evaluation today.  He has a history of rheumatoid arthritis and is on immune modulating medication including methotrexate and so often gets recurrent sinus infections.  He denies any recent antibiotics in the past 90 days.  Denies any known sick contacts.  He is having difficulty with daily activities as result of symptoms.    Past Medical History:  Diagnosis Date   Acid reflux disease    Allergy    Asthma    as child   BMI between 19-24,adult JUL 2012 191.8 LBS   Collagen vascular disease    GERD (gastroesophageal reflux disease)    Internal hemorrhoids with other complication    TCS DEC 2013   Pulmonary nodule 2013   [facility] - Repeat 08/2012   Rheumatoid arthritis(714.0)     Patient Active Problem List   Diagnosis Date Noted   Hiatal hernia 03/01/2022   Abnormal CT scan, gastrointestinal tract 03/01/2022   Myalgia due to statin 06/12/2021   Hx of colonic polyps    Thrombocytopenia 06/23/2016   BPH (benign prostatic hyperplasia) 01/28/2016   Dyslipidemia (high LDL; low HDL) 01/28/2016   Internal hemorrhoids with complication 12/14/2012   GERD (gastroesophageal reflux disease) 04/21/2011   Colon cancer screening 04/21/2011   Dysphagia 12/30/2010    Past Surgical History:  Procedure Laterality Date   COLONOSCOPY  2009 NUR [facility]   MULTIPLE SIMPLE ADENOMAS   COLONOSCOPY N/A 05/02/2017    Dr. Darrick Penna: 4 simple adenomas removed, diverticulosis.  Next colonoscopy in 3 years.   COLONOSCOPY W/ POLYPECTOMY  2010 NUR   COLONOSCOPY WITH PROPOFOL  05/15/2012   SLF:Two sessile polyps ranging between 3-59mm in size were found in the ascending colon and rectum; multiple biopsies were performed/Mild diverticulosis was noted in the sigmoid colon/The colon mucosa was otherwise normal/ Moderate sized internal hemorrhoids. TCS 04/2017.   COLONOSCOPY WITH PROPOFOL N/A 04/29/2020   Procedure: COLONOSCOPY WITH PROPOFOL;  Surgeon: Lanelle Bal, DO;  [facility];  Service: Endoscopy;  Laterality: N/A;  7:30am   ESOPHAGOGASTRODUODENOSCOPY  01/01/2011 VE:LFYB CHRONIC GASTRITIS   SLF: Hiatal Hernia/mild gastritis/stricture in the distal esophagus   FOOT SURGERY     POLYPECTOMY  05/15/2012   SIMPLE ADENOMA(2)   POLYPECTOMY  04/29/2020   Procedure: POLYPECTOMY;  Surgeon: Lanelle Bal, DO;  [facility];  Service: Endoscopy;;   SAVORY DILATION  01/01/2011   Procedure: SAVORY DILATION;  Surgeon: Arlyce Harman, MD;  [facility];  Service: Endoscopy;  Laterality: N/A;   SHOULDER OPEN ROTATOR CUFF REPAIR  2005   WRIST FUSION         Home Medications    Prior to Admission medications   Medication Sig Start Date End Date Taking? Authorizing Provider  doxycycline (VIBRAMYCIN) 100 MG capsule Take 1 capsule (100 mg total) by mouth 2 (two) times daily. 08/27/22  Yes Keundra Petrucelli, Noberto Retort, PA-C  fexofenadine (ALLEGRA) 180 MG tablet Take 180 mg by mouth daily.   Yes [provider]  acetaminophen (TYLENOL) 325 MG tablet Take 2 tablets (650 mg total) by mouth every 6 (six) hours as needed. Patient not taking: Reported on 06/30/2022 07/06/21   Wallis BambergMani, Mario, PA-C  ezetimibe (ZETIA) 10 MG tablet Take 1 tablet (10 mg total) by mouth daily. 06/01/22   Babs SciaraLuking, Scott A, MD  folic acid (FOLVITE) 1 MG tablet Take 1 mg by mouth daily.      [provider]  Golimumab (SIMPONI  ARIA IV) Inject into the vein. Every 8 weeks    [provider]  methotrexate 2.5 MG tablet Take 17.5 mg by mouth every Monday. 7 tablets-Monday    [provider]  pantoprazole (PROTONIX) 40 MG tablet Take 1 tablet (40 mg total) by mouth 2 (two) times daily. 06/30/22 06/30/23  Lanelle Balarver, Charles K, DO    Family History Family History  Problem Relation Age of Onset   Diabetes Father    Cancer Sister        breast   Cancer Brother        "intestines"   Congestive Heart Failure Brother    Colon cancer Neg Hx    Colon polyps Neg Hx     Social History Social History   Tobacco Use   Smoking status: Never    Passive exposure: Never   Smokeless tobacco: Never  Vaping Use   Vaping Use: Never used  Substance Use Topics   Alcohol use: No   Drug use: No     Allergies   Cefprozil and Statins   Review of Systems Review of Systems  Constitutional:  Negative for activity change, appetite change, fatigue and fever.  HENT:  Positive for congestion, postnasal drip and sinus pressure. Negative for sneezing and sore throat.   Respiratory:  Negative for cough and shortness of breath.   Cardiovascular:  Negative for chest pain.  Gastrointestinal:  Negative for abdominal pain, diarrhea, nausea and vomiting.  Neurological:  Positive for headaches. Negative for dizziness and light-headedness.     Physical Exam Triage Vital Signs ED Triage Vitals  Enc Vitals Group     BP 08/27/22 1113 138/75     Pulse Rate 08/27/22 1113 69     Resp 08/27/22 1113 18     Temp 08/27/22 1113 97.7 F (36.5 C)     Temp Source 08/27/22 1113 Oral     SpO2 08/27/22 1113 98 %     Weight --      Height --      Head Circumference --      Peak Flow --      Pain Score 08/27/22 1116 1     Pain Loc --      Pain Edu? --      Excl. in GC? --    No data found.  Updated Vital Signs BP 138/75 (BP Location: Right Arm)   Pulse 69   Temp 97.7 F (36.5 C) (Oral)   Resp 18   SpO2 98%   Visual  Acuity Right Eye Distance:   Left Eye Distance:   Bilateral Distance:    Right Eye Near:   Left Eye Near:    Bilateral Near:     Physical Exam Vitals reviewed.  Constitutional:      General: He is awake.     Appearance: Normal appearance. He is well-developed. He is not ill-appearing.     Comments: Very pleasant male  appears stated age in no acute distress sitting comfortably in exam room  HENT:     Head: Normocephalic and atraumatic.     Right Ear: Tympanic membrane, ear canal and external ear normal. Tympanic membrane is not erythematous or bulging.     Left Ear: Tympanic membrane, ear canal and external ear normal. Tympanic membrane is not erythematous or bulging.     Nose:     Right Sinus: Maxillary sinus tenderness and frontal sinus tenderness present.     Left Sinus: Maxillary sinus tenderness and frontal sinus tenderness present.     Mouth/Throat:     Pharynx: Uvula midline. No oropharyngeal exudate or posterior oropharyngeal erythema.  Cardiovascular:     Rate and Rhythm: Normal rate and regular rhythm.     Heart sounds: Normal heart sounds, S1 normal and S2 normal. No murmur heard. Pulmonary:     Effort: Pulmonary effort is normal.     Breath sounds: Normal breath sounds. No stridor. No wheezing, rhonchi or rales.     Comments: Clear to auscultation bilaterally Neurological:     Mental Status: He is alert.  Psychiatric:        Behavior: Behavior is cooperative.      UC Treatments / Results  Labs (all labs ordered are listed, but only abnormal results are displayed) Labs Reviewed - No data to display  EKG   Radiology No results found.  Procedures Procedures (including critical care time)  Medications Ordered in UC Medications - No data to display  Initial Impression / Assessment and Plan / UC Course  I have reviewed the triage vital signs and the nursing notes.  Pertinent labs & imaging results that were available during my care of the patient were  reviewed by me and considered in my medical decision making (see chart for details).     Patient is well-appearing, afebrile, nontoxic, nontachycardic.  Given his history of recurrent sinus infections with recent double worsening of symptoms will cover for secondary bacterial infection with doxycycline 100 mg twice a day for 10 days.  Discussed that he should avoid prolonged sun exposure while on this medication due to associated photosensitivity.  Encouraged him to continue over-the-counter medication including antihistamine, Mucinex, Flonase.  He can use nasal saline and sinus rinses for additional symptom relief.  If he has any worsening or changing symptoms he needs to be seen immediately.  Strict return precautions given to which he expressed understanding.  Final Clinical Impressions(s) / UC Diagnoses   Final diagnoses:  Acute recurrent pansinusitis     Discharge Instructions      We are covering you for a sinus infection.  Please start doxycycline 100 mg twice daily for 10 days.  Stay out of the sun while on this medication.  Continue over-the-counter medication including allergy medicine.  Use nasal saline and sinus rinses.  If your symptoms or not improving or if anything worsens please return for reevaluation.     ED Prescriptions     Medication Sig Dispense Auth. Provider   doxycycline (VIBRAMYCIN) 100 MG capsule Take 1 capsule (100 mg total) by mouth 2 (two) times daily. 20 capsule Myron Stankovich, Noberto Retort, PA-C      PDMP not reviewed this encounter.   Jeani Hawking, PA-C 08/27/22 1131

## 2022-08-27 NOTE — ED Triage Notes (Signed)
Nasal congestion, facial pressure, nose running x 2 to 3 days.  States throat is some what sore from drainage down throat.

## 2022-08-28 ENCOUNTER — Other Ambulatory Visit: Payer: Self-pay | Admitting: Family Medicine

## 2022-08-30 ENCOUNTER — Encounter: Payer: Self-pay | Admitting: Nurse Practitioner

## 2022-08-30 ENCOUNTER — Ambulatory Visit: Payer: Medicare Other | Attending: Nurse Practitioner | Admitting: Nurse Practitioner

## 2022-08-30 ENCOUNTER — Ambulatory Visit (INDEPENDENT_AMBULATORY_CARE_PROVIDER_SITE_OTHER): Payer: Medicare Other | Admitting: Family Medicine

## 2022-08-30 ENCOUNTER — Encounter: Payer: Medicare Other | Admitting: Family Medicine

## 2022-08-30 ENCOUNTER — Ambulatory Visit: Payer: Medicare Other | Attending: Nurse Practitioner

## 2022-08-30 ENCOUNTER — Ambulatory Visit: Payer: Medicare Other | Admitting: Nurse Practitioner

## 2022-08-30 VITALS — BP 138/78 | HR 63 | Ht 72.75 in | Wt 204.0 lb

## 2022-08-30 VITALS — BP 120/76 | HR 59 | Ht 75.0 in | Wt 204.0 lb

## 2022-08-30 DIAGNOSIS — D696 Thrombocytopenia, unspecified: Secondary | ICD-10-CM | POA: Diagnosis not present

## 2022-08-30 DIAGNOSIS — I700 Atherosclerosis of aorta: Secondary | ICD-10-CM

## 2022-08-30 DIAGNOSIS — Z0001 Encounter for general adult medical examination with abnormal findings: Secondary | ICD-10-CM | POA: Diagnosis not present

## 2022-08-30 DIAGNOSIS — I2510 Atherosclerotic heart disease of native coronary artery without angina pectoris: Secondary | ICD-10-CM | POA: Diagnosis present

## 2022-08-30 DIAGNOSIS — R000 Tachycardia, unspecified: Secondary | ICD-10-CM | POA: Insufficient documentation

## 2022-08-30 DIAGNOSIS — N401 Enlarged prostate with lower urinary tract symptoms: Secondary | ICD-10-CM

## 2022-08-30 DIAGNOSIS — T466X5A Adverse effect of antihyperlipidemic and antiarteriosclerotic drugs, initial encounter: Secondary | ICD-10-CM

## 2022-08-30 DIAGNOSIS — M79604 Pain in right leg: Secondary | ICD-10-CM | POA: Insufficient documentation

## 2022-08-30 DIAGNOSIS — E782 Mixed hyperlipidemia: Secondary | ICD-10-CM

## 2022-08-30 DIAGNOSIS — Z125 Encounter for screening for malignant neoplasm of prostate: Secondary | ICD-10-CM

## 2022-08-30 DIAGNOSIS — M79605 Pain in left leg: Secondary | ICD-10-CM | POA: Diagnosis present

## 2022-08-30 DIAGNOSIS — M791 Myalgia, unspecified site: Secondary | ICD-10-CM

## 2022-08-30 DIAGNOSIS — Z01810 Encounter for preprocedural cardiovascular examination: Secondary | ICD-10-CM | POA: Insufficient documentation

## 2022-08-30 DIAGNOSIS — R7989 Other specified abnormal findings of blood chemistry: Secondary | ICD-10-CM

## 2022-08-30 DIAGNOSIS — J431 Panlobular emphysema: Secondary | ICD-10-CM

## 2022-08-30 DIAGNOSIS — E785 Hyperlipidemia, unspecified: Secondary | ICD-10-CM | POA: Diagnosis present

## 2022-08-30 DIAGNOSIS — Z0000 Encounter for general adult medical examination without abnormal findings: Secondary | ICD-10-CM

## 2022-08-30 NOTE — Progress Notes (Signed)
   Subjective:    Patient ID: Steven Glover, male    DOB: 01-16-1951, 72 y.o.   MRN: [id number]  HPI AWV- Annual Wellness Visit Has never smoked Denies any chest tightness pressure pain shortness of breath No rectal bleeding Does have urinary frequency with symptoms of BPH  The patient was seen for their annual wellness visit. The patient's past medical history, surgical history, and family history were reviewed. Pertinent vaccines were reviewed ( tetanus, pneumonia, shingles, flu) The patient's medication list was reviewed and updated.  The height and weight were entered.  BMI recorded in electronic record elsewhere  Cognitive screening was completed. Outcome of Mini - Cog: 5   Falls /depression screening electronically recorded within record elsewhere  Current tobacco usage:no (All patients who use tobacco were given written and verbal information on quitting)  Recent listing of emergency department/hospitalizations over the past year were reviewed.  current specialist the patient sees on a regular basis: Rheumatology   Medicare annual wellness visit patient questionnaire was reviewed.  A written screening schedule for the patient for the next 5-10 years was given. Appropriate discussion of followup regarding next visit was discussed.       Review of Systems     Objective:   Physical Exam General-in no acute distress Eyes-no discharge Lungs-respiratory rate normal, CTA CV-no murmurs,RRR Extremities skin warm dry no edema Neuro grossly normal Behavior normal, alert        Assessment & Plan:  Annual wellness Immunizations discussed Patient will get his shingles through pharmacy when approved by his rheumatologist  Colonoscopy up-to-date No recent falls injuries Lab work ordered  1. Encounter for subsequent annual wellness visit (AWV) in Medicare patient Adult wellness-complete.wellness physical was conducted today. Importance of diet and exercise  were discussed in detail.  Importance of stress reduction and healthy living were discussed.  In addition to this a discussion regarding safety was also covered.  We also reviewed over immunizations and gave recommendations regarding current immunization needed for age.   In addition to this additional areas were also touched on including: Preventative health exams needed:  Colonoscopy 2026  Patient was advised yearly wellness exam   2. Mixed hyperlipidemia Healthy diet check lipid profile continue Zetia - Lipid panel  3. Screening for prostate cancer Screening PSA - PSA  4. Elevated serum creatinine Screening metabolic 7 - Basic Metabolic Panel  5. Benign prostatic hyperplasia with lower urinary tract symptoms, symptom details unspecified Screening PSA has BPH - PSA  Patient does not tolerate statins Aortic atherosclerosis seen on CAT scan being treated with Zetia Emphysema on CAT scan but patient is asymptomatic Never smoked Thrombocytopenia no bleeding issues followed on a regular basis by his rheumatologist

## 2022-08-30 NOTE — Progress Notes (Signed)
Office Visit    Patient Name: Steven Glover Date of Encounter: 08/30/2022  Primary Care Provider:  Babs Sciara, MD Primary Cardiologist:  Dietrich Pates, MD  Chief Complaint    72 year old male with a history of mild nonobstructive CAD, hyperlipidemia, hiatal hernia, GERD, and rheumatoid arthritis who presents for follow-up related to CAD, elevated HR and for preoperative cardiac evaluation.   Past Medical History    Past Medical History:  Diagnosis Date   Acid reflux disease    Allergy    Asthma    as child   BMI between 19-24,adult JUL 2012 191.8 LBS   Collagen vascular disease    GERD (gastroesophageal reflux disease)    Internal hemorrhoids with other complication    TCS DEC 2013   Pulmonary nodule 2013   [facility] - Repeat 08/2012   Rheumatoid arthritis(714.0)    Past Surgical History:  Procedure Laterality Date   COLONOSCOPY  2009 NUR [facility]   MULTIPLE SIMPLE ADENOMAS   COLONOSCOPY N/A 05/02/2017   Dr. Darrick Penna: 4 simple adenomas removed, diverticulosis.  Next colonoscopy in 3 years.   COLONOSCOPY W/ POLYPECTOMY  2010 NUR   COLONOSCOPY WITH PROPOFOL  05/15/2012   SLF:Two sessile polyps ranging between 3-27mm in size were found in the ascending colon and rectum; multiple biopsies were performed/Mild diverticulosis was noted in the sigmoid colon/The colon mucosa was otherwise normal/ Moderate sized internal hemorrhoids. TCS 04/2017.   COLONOSCOPY WITH PROPOFOL N/A 04/29/2020   Procedure: COLONOSCOPY WITH PROPOFOL;  Surgeon: Lanelle Bal, DO;  [facility];  Service: Endoscopy;  Laterality: N/A;  7:30am   ESOPHAGOGASTRODUODENOSCOPY  01/01/2011 [id number] CHRONIC GASTRITIS   SLF: Hiatal Hernia/mild gastritis/stricture in the distal esophagus   FOOT SURGERY     POLYPECTOMY  05/15/2012   SIMPLE ADENOMA(2)   POLYPECTOMY  04/29/2020   Procedure: POLYPECTOMY;  Surgeon: Lanelle Bal, DO;  [facility]: [facility];  Service: Endoscopy;;   SAVORY DILATION   01/01/2011   Procedure: SAVORY DILATION;  Surgeon: Arlyce Harman, MD;  [facility]: [facility];  Service: Endoscopy;  Laterality: N/A;   SHOULDER OPEN ROTATOR CUFF REPAIR  2005   WRIST FUSION      Allergies  Allergies  Allergen Reactions   Cefprozil     Nausea, felt bad   Statins     myalgias     Labs/Other Studies Reviewed    The following studies were reviewed today: CCTA 02/2022: IMPRESSION: 1.  Normal ascending thoracic aorta 3.6 cm   2.  Calcium score 38.2 which is only 25 th percentile for age / sex   21.  CAD RADS 1 non obstrucitve CAD see description above   Charlton Haws  Recent Labs: 02/07/2022: ALT 29; Hemoglobin 15.0; Platelets 134 02/11/2022: BUN 20; Potassium 4.2; Sodium 137 07/02/2022: Creatinine, Ser 1.10  Recent Lipid Panel    Component Value Date/Time   CHOL 127 06/03/2021 0850   TRIG 150 (H) 06/03/2021 0850   HDL 30 (L) 06/03/2021 0850   CHOLHDL 4.2 06/03/2021 0850   CHOLHDL 5.7 03/28/2013 0739   VLDL 43 (H) 03/28/2013 0739   LDLCALC 71 06/03/2021 0850    History of Present Illness    72 year old male with the above past medical history including mild nonobstructive CAD, hyperlipidemia, hiatal hernia, GERD, and rheumatoid arthritis.  He was evaluated in the [facility] ED in September 2023 in the setting of sudden onset chest pressure that radiated to his arms and back.  CT of  the chest showed mild calcifications, no dissection or PE.  Workup was overall unremarkable.  He was last seen in the office on 02/11/2022 was stable from a cardiac standpoint.  He denied any symptoms concerning for angina.  Follow-up coronary CT angiogram in 02/2022 revealed coronary calcium score 38.2 (25th percentile), mild nonobstructive CAD.  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  During attempted ureteroscopy he had an elevated heart rate in the 150s.  Follow-up with cardiology was advised.  He presents today for follow-up and for preoperative cardiac evaluation for upcoming ureteroscopy and possible biopsy, resection of bladder tumor with Dr. Modena SlaterEugene Bell of [facility]. Since his last visit he has done well from a cardiac standpoint.  He denies any palpitations, dizziness, presyncope, syncope, denies symptoms concerning for angina.  He notes that during his procedure when he had an elevated heart rate he was completely asymptomatic.  He does note intermittent tingling, numbness, and burning sensation in his bilateral lower extremities.  Otherwise, he reports feeling well.  Home Medications    Current Outpatient Medications  Medication Sig Dispense Refill   acetaminophen (TYLENOL) 325 MG tablet Take 2 tablets (650 mg total) by mouth every 6 (six) hours as needed. 30 tablet 0   doxycycline (VIBRAMYCIN) 100 MG capsule Take 1 capsule (100 mg total) by mouth 2 (two) times daily. 20 capsule 0   ezetimibe (ZETIA) 10 MG tablet Take 1 tablet by mouth once daily 90 tablet 0   fexofenadine (ALLEGRA) 180 MG tablet Take 180 mg by mouth daily.     folic acid (FOLVITE) 1 MG tablet Take 1 mg by mouth daily.       Golimumab (SIMPONI ARIA IV) Inject into the vein. Every 8 weeks     methotrexate 2.5 MG tablet Take 17.5 mg by mouth every Monday. 7 tablets-Monday     pantoprazole (PROTONIX) 40 MG tablet Take 1 tablet (40 mg total) by mouth 2 (two) times daily. 180 tablet 3   No current facility-administered medications for this visit.     Review of Systems    He denies chest pain, palpitations, dyspnea, pnd, orthopnea, n, v, dizziness, syncope, edema, weight gain, or early satiety. All other systems reviewed and are otherwise negative except as noted above.   Physical Exam    VS:  BP 120/76   Pulse (!) 59   Ht 6\' 3"  (1.905 m)   Wt 204 lb (92.5 kg)   SpO2 98%   BMI 25.50 kg/m   GEN: Well nourished, well developed, in no acute distress. HEENT:  normal. Neck: Supple, no JVD, carotid bruits, or masses. Cardiac: RRR, no murmurs, rubs, or gallops. No clubbing, cyanosis, edema.  Radials/DP/PT 2+ and equal bilaterally.  Respiratory:  Respirations regular and unlabored, clear to auscultation bilaterally. GI: Soft, nontender, nondistended, BS + x 4. MS: no deformity or atrophy. Skin: warm and dry, no rash. Neuro:  Strength and sensation are intact. Psych: Normal affect.  Accessory Clinical Findings    ECG personally reviewed by me today -sinus bradycardia, 59 bpm- no acute changes.   Lab Results  Component Value Date   WBC 4.1 02/07/2022   HGB 15.0 02/07/2022   HCT 41.4 02/07/2022   MCV 94.3 02/07/2022   PLT 134 (L) 02/07/2022   Lab Results  Component Value Date   CREATININE 1.10 07/02/2022   BUN 20 02/11/2022   NA 137 02/11/2022  K 4.2 02/11/2022   CL 108 02/11/2022   CO2 25 02/11/2022   Lab Results  Component Value Date   ALT 29 02/07/2022   AST 29 02/07/2022   ALKPHOS 72 02/07/2022   BILITOT 0.6 02/07/2022   Lab Results  Component Value Date   CHOL 127 06/03/2021   HDL 30 (L) 06/03/2021   LDLCALC 71 06/03/2021   TRIG 150 (H) 06/03/2021   CHOLHDL 4.2 06/03/2021    No results found for: "HGBA1C"  Assessment & Plan    1. Tachycardia: Recent episode of elevated heart rate in the 150s during attempted ureteroscopy. He was asymptomatic at the time.  Records not available for review. Urology canceled the procedure and advised him to follow-up with cardiology.  He denies any palpitations, he is unaware of any elevated heart rate.  EKG today shows sinus bradycardia.  However, given need for surgical clearance, will check 14-day ZIO.  Will check echo.  Discussed ED precautions.  2. CAD: Coronary CT angiogram in 02/2022 revealed coronary calcium score 38.2 (25th percentile), mild nonobstructive CAD. Stable with no anginal symptoms. No indication for ischemic evaluation.  Continue Zetia.  3. Hyperlipidemia: LDL was 71  in 05/2021. Continue Zetia.   4. Bilateral leg pain/numbness tingling: He notes a longstanding history of intermittent bilateral numbness, tingling, and burning to his legs bilaterally.  Will check ABIs.  5. Preoperative cardiac exam:  According to the Revised Cardiac Risk Index (RCRI), his Perioperative Risk of Major Cardiac Event is (%): 0.4. His Functional Capacity in METs is: 8.97 according to the [facility] Activity Status Index (DASI). Therefore, based on ACC/AHA guidelines, patient would be at acceptable risk for the planned procedure without further cardiovascular testing.  However, patient's procedure was recently canceled in the setting of elevated heart rate.  In this setting will check 14-day ZIO, echo as above.  If testing unremarkable, he is okay to proceed with ureteroscopy/biopsy.     6. Disposition: Follow-up in 6 months, sooner if needed.      Joylene Grapes, NP 08/30/2022, 2:57 PM

## 2022-08-30 NOTE — Progress Notes (Unsigned)
Enrolled for [employer] to mail a ZIO XT long term holter monitor to the patients address on file.   Dr. Ross to read. 

## 2022-08-30 NOTE — Patient Instructions (Signed)
Medication Instructions:  Your physician recommends that you continue on your current medications as directed. Please refer to the Current Medication list given to you today.  *If you need a refill on your cardiac medications before your next appointment, please call your pharmacy*   Lab Work: NONE ordered at this time of appointment   If you have labs (blood work) drawn today and your tests are completely normal, you will receive your results only by: MyChart Message (if you have MyChart) OR A paper copy in the mail If you have any lab test that is abnormal or we need to change your treatment, we will call you to review the results.   Testing/Procedures: Your physician has requested that you have an ankle brachial index (ABI). During this test an ultrasound and blood pressure cuff are used to evaluate the arteries that supply the arms and legs with blood. Allow thirty minutes for this exam. There are no restrictions or special instructions.  Your physician has requested that you have an echocardiogram. Echocardiography is a painless test that uses sound waves to create images of your heart. It provides your doctor with information about the size and shape of your heart and how well your heart's chambers and valves are working. This procedure takes approximately one hour. There are no restrictions for this procedure. Please do NOT wear cologne, perfume, aftershave, or lotions (deodorant is allowed). Please arrive 15 minutes prior to your appointment time.  ZIO AT Long term monitor-Live Telemetry  Your physician has requested you wear a ZIO patch monitor for 14 days.  This is a single patch monitor. [employer] supplies one patch monitor per enrollment. Additional  stickers are not available.  Please do not apply patch if you will be having a Nuclear Stress Test, Echocardiogram, Cardiac CT, MRI,  or Chest Xray during the period you would be wearing the monitor. The patch cannot be worn during   these tests. You cannot remove and re-apply the ZIO AT patch monitor.  Your ZIO patch monitor will be mailed 3 day USPS to your address on file. It may take 3-5 days to  receive your monitor after you have been enrolled.  Once you have received your monitor, please review the enclosed instructions. Your monitor has  already been registered assigning a specific monitor serial # to you.   Billing and Patient Assistance Program information  Meredeth Ide has been supplied with any insurance information on record for billing. [employer] offers a sliding scale Patient Assistance Program for patients without insurance, or whose  insurance does not completely cover the cost of the ZIO patch monitor. You must apply for the  Patient Assistance Program to qualify for the discounted rate. To apply, call [employer] at [phone],  select option 4, select option 2 , ask to apply for the Patient Assistance Program, (you can request an  interpreter if needed). [employer] will ask your household income and how many people are in your  household. [employer] will quote your out-of-pocket cost based on this information. They will also be able  to set up a 12 month interest free payment plan if needed.  Applying the monitor   Shave hair from upper left chest.  Hold the abrader disc by orange tab. Rub the abrader in 40 strokes over left upper chest as indicated in  your monitor instructions.  Clean area with 4 enclosed alcohol pads. Use all pads to ensure the area is cleaned thoroughly. Let  dry.  Apply patch as indicated in  monitor instructions. Patch will be placed under collarbone on left side of  chest with arrow pointing upward.  Rub patch adhesive wings for 2 minutes. Remove the white label marked "1". Remove the white label  marked "2". Rub patch adhesive wings for 2 additional minutes.  While looking in a mirror, press and release button in center of patch. A small green light will flash 3-4  times. This will be  your only indicator that the monitor has been turned on.  Do not shower for the first 24 hours. You may shower after the first 24 hours.  Press the button if you feel a symptom. You will hear a small click. Record Date, Time and Symptom in  the Patient Log.   Starting the Gateway  In your kit there is a Audiological scientist box the size of a cellphone. This is Buyer, retail. It transmits all your  recorded data to [facility]. This box must always stay within 10 feet of you. Open the box and push the *  button. There will be a light that blinks orange and then green a few times. When the light stops  blinking, the Gateway is connected to the ZIO patch. Call [employer] at [phone] to confirm your monitor is transmitting.  Returning your monitor  Remove your patch and place it inside the Gateway. In the lower half of the Gateway there is a white  bag with prepaid postage on it. Place Gateway in bag and seal. Mail package back to [place] as soon as  possible. Your physician should have your final report approximately 7 days after you have mailed back  your monitor. Call [facility] Customer Care at [phone] if you have questions regarding your ZIO AT  patch monitor. Call them immediately if you see an orange light blinking on your monitor.  If your monitor falls off in less than 4 days, contact our Monitor department at [phone]. If your  monitor becomes loose or falls off after 4 days call [employer] at [phone] for suggestions on  securing your monitor   Follow-Up: At [facility], you and your health needs are our priority.  As part of our continuing mission to provide you with exceptional heart care, we have created designated Provider Care Teams.  These Care Teams include your primary Cardiologist (physician) and Advanced Practice Providers (APPs -  Physician Assistants and Nurse Practitioners) who all work together to provide you with the care you need, when you need  it.  We recommend signing up for the patient portal called "MyChart".  Sign up information is provided on this After Visit Summary.  MyChart is used to connect with patients for Virtual Visits (Telemedicine).  Patients are able to view lab/test results, encounter notes, upcoming appointments, etc.  Non-urgent messages can be sent to your provider as well.   To learn more about what you can do with MyChart, go to [email or web address].    Your next appointment:   6 month(s)  Provider:   Bernadene Person, NP        Other Instructions

## 2022-09-01 LAB — LIPID PANEL
Chol/HDL Ratio: 4 ratio (ref 0.0–5.0)
Cholesterol, Total: 125 mg/dL (ref 100–199)
HDL: 31 mg/dL — ABNORMAL LOW (ref >39)
LDL Chol Calc (NIH): 67 mg/dL (ref 0–99)
Triglycerides: 159 mg/dL — ABNORMAL HIGH (ref 0–149)
VLDL Cholesterol Cal: 27 mg/dL (ref 5–40)

## 2022-09-01 LAB — BASIC METABOLIC PANEL
BUN/Creatinine Ratio: 15 (ref 10–24)
BUN: 15 mg/dL (ref 8–27)
CO2: 20 mmol/L (ref 20–29)
Calcium: 9.2 mg/dL (ref 8.6–10.2)
Chloride: 107 mmol/L — ABNORMAL HIGH (ref 96–106)
Creatinine, Ser: 1.02 mg/dL (ref 0.76–1.27)
Glucose: 92 mg/dL (ref 70–99)
Potassium: 3.8 mmol/L (ref 3.5–5.2)
Sodium: 140 mmol/L (ref 134–144)
eGFR: 77 mL/min/{1.73_m2} (ref >59)

## 2022-09-01 LAB — PSA: Prostate Specific Ag, Serum: 1.8 ng/mL (ref 0.0–4.0)

## 2022-09-02 ENCOUNTER — Other Ambulatory Visit: Payer: Self-pay | Admitting: Nurse Practitioner

## 2022-09-02 ENCOUNTER — Encounter: Payer: Self-pay | Admitting: Family Medicine

## 2022-09-02 DIAGNOSIS — M79604 Pain in right leg: Secondary | ICD-10-CM

## 2022-09-02 DIAGNOSIS — R000 Tachycardia, unspecified: Secondary | ICD-10-CM

## 2022-09-02 DIAGNOSIS — E785 Hyperlipidemia, unspecified: Secondary | ICD-10-CM

## 2022-09-02 DIAGNOSIS — I2510 Atherosclerotic heart disease of native coronary artery without angina pectoris: Secondary | ICD-10-CM

## 2022-09-02 DIAGNOSIS — R200 Anesthesia of skin: Secondary | ICD-10-CM

## 2022-09-02 DIAGNOSIS — Z01810 Encounter for preprocedural cardiovascular examination: Secondary | ICD-10-CM

## 2022-09-02 NOTE — Progress Notes (Signed)
This please mail to the patient

## 2022-09-21 ENCOUNTER — Ambulatory Visit (HOSPITAL_COMMUNITY)
Admission: RE | Admit: 2022-09-21 | Discharge: 2022-09-21 | Disposition: A | Discharge status: Home / Self Care | Payer: Medicare Other | Source: Ambulatory Visit | Attending: Nurse Practitioner | Admitting: Nurse Practitioner

## 2022-09-21 DIAGNOSIS — R200 Anesthesia of skin: Secondary | ICD-10-CM | POA: Diagnosis not present

## 2022-09-21 DIAGNOSIS — R202 Paresthesia of skin: Secondary | ICD-10-CM

## 2022-09-21 DIAGNOSIS — M79672 Pain in left foot: Secondary | ICD-10-CM | POA: Insufficient documentation

## 2022-09-21 DIAGNOSIS — M79671 Pain in right foot: Secondary | ICD-10-CM | POA: Diagnosis not present

## 2022-09-21 LAB — VAS US ABI WITH/WO TBI
Left ABI: 1.21
Right ABI: 1.13

## 2022-09-23 ENCOUNTER — Telehealth: Payer: Self-pay

## 2022-09-23 NOTE — Telephone Encounter (Signed)
Spoke with pt. Pt was notified of VAS [place] ABI results. Pt will continue current medication and f/u as planned.

## 2022-09-24 ENCOUNTER — Telehealth: Payer: Self-pay | Admitting: Internal Medicine

## 2022-09-24 NOTE — Telephone Encounter (Signed)
Caller is reporting abnormal Zio patch results. 

## 2022-09-24 NOTE — Telephone Encounter (Signed)
   Cardiac Monitor Alert  Date of alert:  09/24/2022   Patient Name: Steven Glover  DOB: [birth year]  MRN: [id number]   [facility] Cardiologist: Dietrich Pates, MD  [facility] EP:  None    Monitor Information: Long Term Monitor [ZioXT]  Reason:  Tachycardia Ordering provider:  Bernadene Person   Alert Supraventricular Tachycardia - fastest HR:  203 This is the 1st alert for this rhythm.   Next Cardiology Appointment   None scheduled at this time.   The patient was contacted today.  He is asymptomatic. Arrhythmia, symptoms and history reviewed with Dr. Lalla Brothers.   Plan:  Dr. Lalla Brothers requests visit with afib clinic in 1-2 days and follow up with E. Monge in 1-2 weeks.    Other: Olivia from [facility] calling in to alert that patient had SVT run of 203 bpm that lasted 60 seconds. Patient wore monitor from 09/01/22 to 09/15/22. Called patient to ask about symptoms over the last month. He denies any SOB, palpitations or chest pain. He states when he is exerting himself he can sometimes "feel" that his heart is faster. Discussed with Dr. Lalla Brothers, referred to afib clinic and requested appt on Monday. Appt made w/ E. Monge.   Luellen Pucker, RN  09/24/2022 3:52 PM

## 2022-09-24 NOTE — Telephone Encounter (Signed)
Patient scheduled for afib clinic Monday 09/27/22 at 9 AM. Scheduled patient for follow up w/ E. Monge NP 10/01/22.

## 2022-09-27 ENCOUNTER — Telehealth: Payer: Self-pay | Admitting: *Deleted

## 2022-09-27 ENCOUNTER — Ambulatory Visit (HOSPITAL_COMMUNITY)
Admission: RE | Admit: 2022-09-27 | Discharge: 2022-09-27 | Disposition: A | Discharge status: Home / Self Care | Payer: Medicare Other | Source: Ambulatory Visit | Attending: Internal Medicine | Admitting: Internal Medicine

## 2022-09-27 VITALS — BP 142/76 | HR 50 | Ht 75.0 in | Wt 199.6 lb

## 2022-09-27 DIAGNOSIS — D6869 Other thrombophilia: Secondary | ICD-10-CM | POA: Diagnosis not present

## 2022-09-27 DIAGNOSIS — E785 Hyperlipidemia, unspecified: Secondary | ICD-10-CM | POA: Diagnosis not present

## 2022-09-27 DIAGNOSIS — Z8249 Family history of ischemic heart disease and other diseases of the circulatory system: Secondary | ICD-10-CM | POA: Diagnosis not present

## 2022-09-27 DIAGNOSIS — M069 Rheumatoid arthritis, unspecified: Secondary | ICD-10-CM | POA: Insufficient documentation

## 2022-09-27 DIAGNOSIS — I2510 Atherosclerotic heart disease of native coronary artery without angina pectoris: Secondary | ICD-10-CM | POA: Insufficient documentation

## 2022-09-27 DIAGNOSIS — R0683 Snoring: Secondary | ICD-10-CM | POA: Diagnosis not present

## 2022-09-27 DIAGNOSIS — I480 Paroxysmal atrial fibrillation: Secondary | ICD-10-CM | POA: Diagnosis not present

## 2022-09-27 MED ORDER — APIXABAN 5 MG PO TABS: 5.0000 mg | ORAL_TABLET | Freq: Two times a day (BID) | ORAL | 3 refills | Status: DC

## 2022-09-27 MED ORDER — AMIODARONE HCL 200 MG PO TABS: ORAL_TABLET | ORAL | 0 refills | Status: DC

## 2022-09-27 NOTE — Progress Notes (Addendum)
Primary Care Physician: Babs Sciara, MD Primary Cardiologist: Dr. Tenny Craw Primary Electrophysiologist: None Referring Physician: Dr. Lalla Brothers (read cardiac monitor)   Steven Glover is a 72 y.o. male with a history of mild nonobstructive CAD, HLD, hiatal hernia, GERD, rheumatoid arthritis, bladder tumor, and paroxysmal atrial fibrillation who presents for consultation in the [facility]. His ureteroscopy was canceled by Urology due to HR in the 150s. Saw cardiology 4/8 and cardiac monitor placed and echo ordered. Cardiac monitor showed SVT run of 203 bpm that lasted 60 seconds and also showed 16% Afib burden. Patient is not on anticoagulation. He has a CHADS2VASC score of 2.  On evaluation today, he is currently in SR. He is not sure when he is in Afib. He does not have a device to check his rhythm at this time. He can feel his increased heart rate when he is exerting himself but not sure if abnormal. He notes intermittently feeling tired. No shortness of breath or chest pain. He does not drink alcohol. Wife states he snores loudly and stops breathing at night.   Today, he denies symptoms of palpitations, chest pain, shortness of breath, orthopnea, PND, lower extremity edema, dizziness, presyncope, syncope, snoring, daytime somnolence, bleeding, or neurologic sequela. The patient is tolerating medications without difficulties and is otherwise without complaint today.   Atrial Fibrillation Risk Factors:  he does have symptoms or diagnosis of sleep apnea. he does not have a history of rheumatic fever. he does not have a history of alcohol use. The patient does not have a history of early familial atrial fibrillation or other arrhythmias.  he has a BMI of Body mass index is 25.5 kg/m.Marland Kitchen There were no vitals filed for this visit.  Family History  Problem Relation Age of Onset   Diabetes Father    Cancer Sister        breast   Cancer Brother         "intestines"   Congestive Heart Failure Brother    Colon cancer Neg Hx    Colon polyps Neg Hx      Atrial Fibrillation Management history:  Previous antiarrhythmic drugs: None Previous cardioversions: None Previous ablations: None Anticoagulation history: None   Past Medical History:  Diagnosis Date   Acid reflux disease    Allergy    Asthma    as child   BMI between 19-24,adult JUL 2012 191.8 LBS   Collagen vascular disease (HCC)    GERD (gastroesophageal reflux disease)    Internal hemorrhoids with other complication    TCS DEC 2013   Pulmonary nodule 2013   [facility] - Repeat 08/2012   Rheumatoid arthritis(714.0)    Past Surgical History:  Procedure Laterality Date   COLONOSCOPY  2009 NUR [facility]   MULTIPLE SIMPLE ADENOMAS   COLONOSCOPY N/A 05/02/2017   Dr. Darrick Penna: 4 simple adenomas removed, diverticulosis.  Next colonoscopy in 3 years.   COLONOSCOPY W/ POLYPECTOMY  2010 NUR   COLONOSCOPY WITH PROPOFOL  05/15/2012   SLF:Two sessile polyps ranging between 3-60mm in size were found in the ascending colon and rectum; multiple biopsies were performed/Mild diverticulosis was noted in the sigmoid colon/The colon mucosa was otherwise normal/ Moderate sized internal hemorrhoids. TCS 04/2017.   COLONOSCOPY WITH PROPOFOL N/A 04/29/2020   Procedure: COLONOSCOPY WITH PROPOFOL;  Surgeon: Lanelle Bal, DO;  [facility];  Service: Endoscopy;  Laterality: N/A;  7:30am   ESOPHAGOGASTRODUODENOSCOPY  01/01/2011 GN:FAOZ CHRONIC GASTRITIS   SLF: Hiatal  Hernia/mild gastritis/stricture in the distal esophagus   FOOT SURGERY     POLYPECTOMY  05/15/2012   SIMPLE ADENOMA(2)   POLYPECTOMY  04/29/2020   Procedure: POLYPECTOMY;  Surgeon: Lanelle Bal, DO;  Location: [facility];  Service: Endoscopy;;   SAVORY DILATION  01/01/2011   Procedure: SAVORY DILATION;  Surgeon: Arlyce Harman, MD;  Location: [facility];  Service: Endoscopy;  Laterality: N/A;   SHOULDER OPEN ROTATOR CUFF  REPAIR  2005   WRIST FUSION      Current Outpatient Medications  Medication Sig Dispense Refill   acetaminophen (TYLENOL) 325 MG tablet Take 2 tablets (650 mg total) by mouth every 6 (six) hours as needed. 30 tablet 0   doxycycline (VIBRAMYCIN) 100 MG capsule Take 1 capsule (100 mg total) by mouth 2 (two) times daily. 20 capsule 0   ezetimibe (ZETIA) 10 MG tablet Take 1 tablet by mouth once daily 90 tablet 0   fexofenadine (ALLEGRA) 180 MG tablet Take 180 mg by mouth daily.     folic acid (FOLVITE) 1 MG tablet Take 1 mg by mouth daily.       Golimumab (SIMPONI ARIA IV) Inject into the vein. Every 8 weeks     methotrexate 2.5 MG tablet Take 17.5 mg by mouth every Monday. 7 tablets-Monday     pantoprazole (PROTONIX) 40 MG tablet Take 1 tablet (40 mg total) by mouth 2 (two) times daily. 180 tablet 3   No current facility-administered medications for this encounter.    Allergies  Allergen Reactions   Cefprozil     Nausea, felt bad   Statins     myalgias    Social History   Socioeconomic History   Marital status: Married    Spouse name: Not on file   Number of children: Not on file   Years of education: Not on file   Highest education level: Not on file  Occupational History   Not on file  Tobacco Use   Smoking status: Never    Passive exposure: Never   Smokeless tobacco: Never  Vaping Use   Vaping Use: Never used  Substance and Sexual Activity   Alcohol use: No   Drug use: No   Sexual activity: Yes    Birth control/protection: None  Other Topics Concern   Not on file  Social History Narrative   MARRIED-Daughter, [name], nurse ([employer]), 2 CHILDREN   Social Determinants of Health   Financial Resource Strain: Not on file  Food Insecurity: Not on file  Transportation Needs: Not on file  Physical Activity: Not on file  Stress: Not on file  Social Connections: Not on file  Intimate Partner Violence: Not on file    ROS- All systems are reviewed and negative  except as per the HPI above.  Physical Exam: Vitals:   09/27/22 0912  Height: 6\' 3"  (1.905 m)    GEN- The patient is a well appearing male, alert and oriented x 3 today.   Head- normocephalic, atraumatic Eyes-  Sclera clear, conjunctiva pink Ears- hearing intact Oropharynx- clear Neck- supple  Lungs- Clear to ausculation bilaterally, normal work of breathing Heart- Regular rate and rhythm, no murmurs, rubs or gallops  GI- soft, NT, ND, + BS Extremities- no clubbing, cyanosis, or edema MS- no significant deformity or atrophy Skin- no rash or lesion Psych- euthymic mood, full affect Neuro- strength and sensation are intact  Wt Readings from Last 3 Encounters:  08/30/22 92.5 kg  08/30/22 92.5 kg  06/30/22 94.5 kg    EKG today demonstrates  Vent. rate 50 BPM PR interval 206 ms QRS duration 96 ms QT/QTcB 436/397 ms P-R-T axes 34 10 54 Sinus bradycardia Cannot rule out Anterior infarct , age undetermined Abnormal ECG When compared with ECG of 07-Feb-2022 03:06, PREVIOUS ECG IS PRESENT  Echo: Scheduled for 09/29/22.   Cardiac monitor 4/10-24/24: Patient had a min HR of 42 bpm, max HR of 210 bpm, and avg HR of 77 bpm. Predominant underlying rhythm was Sinus Rhythm. 2 Ventricular Tachycardia runs occurred, the run with the fastest interval lasting 8 beats with a max rate of 176 bpm (avg 153 bpm);  the run with the fastest interval was also the longest. 280 Supraventricular Tachycardia runs occurred, the run with the fastest interval lasting 1 hour 6 mins with a max rate of 210 bpm (avg 184 bpm); the run with the fastest interval was also the  longest. Atrial Fibrillation occurred (16% burden), ranging from 61-196 bpm (avg of 115 bpm), the longest lasting 6 hours 49 mins with an avg rate of 133 bpm. Isolated SVEs were occasional (1.1%, 17162), SVE Couplets were rare (<1.0%, 445), and SVE  Triplets were rare (<1.0%, 195). Isolated VEs were rare (<1.0%), VE Couplets were rare  (<1.0%), and no VE Triplets were present. MD notification criteria for Supraventricular Tachycardia met - report posted prior to notification per account request  (PLM).  Epic records are reviewed at length today.  CHA2DS2-VASc Score = 2  The patient's score is based upon: CHF History: 0 HTN History: 0 Diabetes History: 0 Stroke History: 0 Vascular Disease History: 1 Age Score: 1 Gender Score: 0       ASSESSMENT AND PLAN: Paroxysmal Atrial Fibrillation (ICD10:  I48.0) The patient's CHA2DS2-VASc score is 2, indicating a 2.2% annual risk of stroke.    He is currently in SR today.  Discussion regarding monitor showing 16% Afib burden.   Education provided about Afib with visual diagram. Discussion about medication treatments and ablation in detail. After discussion, he is interested in speaking with EP regarding ablation procedure. We will proceed with the following plan:  - Begin Eliquis 5 mg BID. - Begin amiodarone 200 mg BID x 14 days, then once daily. - Schedule sleep study. - Schedule to speak with Dr. Lalla Brothers (he can talk about ablation and potentially Watchman in the future). - Repeat ECG in 2 weeks (will do lab work then).  Addendum: recommended device such as Kardiamobile to check rhythm at home.  2. Secondary Hypercoagulable State (ICD10:  D68.69) The patient is at significant risk for stroke/thromboembolism based upon his CHA2DS2-VASc Score of 2.  Start Apixaban (Eliquis).  Discussed reasoning being anticoagulation according to risk factors for stroke related to Afib. He would like to begin anticoagulation and we will start Eliquis today.  3. Snoring with concern for Obstructive sleep apnea He would like sleep study; will schedule.   Repeat ECG 2 weeks.    [place] Bells, PA-C Afib Clinic [facility] [address] [place], [place] [address] [phone] 09/27/2022 9:22 AM

## 2022-09-27 NOTE — Addendum Note (Signed)
Encounter addended by: Eustace Pen, PA-C on: 09/27/2022 11:00 AM  Actions taken: Clinical Note Signed

## 2022-09-27 NOTE — Patient Instructions (Signed)
Start Eliquis 5mg  twice a day   Start Amiodarone 200mg  -- take 1 tablet twice a day until may 20th then reduce to once a day Take with food

## 2022-09-27 NOTE — Telephone Encounter (Signed)
Prior Authorization for hst sent to [facility] via web portal. Tracking Number .  READY-NO PA REQ

## 2022-09-29 ENCOUNTER — Ambulatory Visit (HOSPITAL_COMMUNITY)
Admission: RE | Admit: 2022-09-29 | Discharge: 2022-09-29 | Disposition: A | Discharge status: Home / Self Care | Payer: Medicare Other | Source: Ambulatory Visit | Attending: Nurse Practitioner | Admitting: Nurse Practitioner

## 2022-09-29 DIAGNOSIS — R000 Tachycardia, unspecified: Secondary | ICD-10-CM | POA: Diagnosis not present

## 2022-09-29 DIAGNOSIS — I2510 Atherosclerotic heart disease of native coronary artery without angina pectoris: Secondary | ICD-10-CM

## 2022-09-29 DIAGNOSIS — I4729 Other ventricular tachycardia: Secondary | ICD-10-CM | POA: Diagnosis not present

## 2022-09-29 LAB — ECHOCARDIOGRAM COMPLETE
AR max vel: 1.84 cm2
AV Area VTI: 1.77 cm2
AV Area mean vel: 2.01 cm2
AV Mean grad: 7 mmHg
AV Peak grad: 16 mmHg
Ao pk vel: 2 m/s
Area-P 1/2: 4.21 cm2
S' Lateral: 3.1 cm

## 2022-09-29 NOTE — Progress Notes (Signed)
*  PRELIMINARY RESULTS* Echocardiogram 2D Echocardiogram has been performed.  Stacey Drain 09/29/2022, 9:16 AM

## 2022-10-01 ENCOUNTER — Ambulatory Visit: Payer: Medicare Other | Admitting: Nurse Practitioner

## 2022-10-04 ENCOUNTER — Telehealth: Payer: Self-pay

## 2022-10-04 NOTE — Telephone Encounter (Signed)
Spoke with pt. Pt was notified of echocardiogram results. Pt will continue current medication and f/u as planned.

## 2022-10-08 ENCOUNTER — Ambulatory Visit: Payer: Medicare Other | Admitting: Nurse Practitioner

## 2022-10-11 ENCOUNTER — Ambulatory Visit (HOSPITAL_COMMUNITY)
Admission: RE | Admit: 2022-10-11 | Discharge: 2022-10-11 | Disposition: A | Discharge status: Home / Self Care | Payer: Medicare Other | Source: Ambulatory Visit | Attending: Internal Medicine | Admitting: Internal Medicine

## 2022-10-11 VITALS — HR 53

## 2022-10-11 DIAGNOSIS — I480 Paroxysmal atrial fibrillation: Secondary | ICD-10-CM

## 2022-10-11 DIAGNOSIS — Z79899 Other long term (current) drug therapy: Secondary | ICD-10-CM | POA: Insufficient documentation

## 2022-10-11 DIAGNOSIS — R001 Bradycardia, unspecified: Secondary | ICD-10-CM | POA: Diagnosis not present

## 2022-10-11 LAB — COMPREHENSIVE METABOLIC PANEL
ALT: 21 U/L (ref 0–44)
AST: 24 U/L (ref 15–41)
Albumin: 3.7 g/dL (ref 3.5–5.0)
Alkaline Phosphatase: 66 U/L (ref 38–126)
Anion gap: 8 (ref 5–15)
BUN: 14 mg/dL (ref 8–23)
CO2: 25 mmol/L (ref 22–32)
Calcium: 8.9 mg/dL (ref 8.9–10.3)
Chloride: 107 mmol/L (ref 98–111)
Creatinine, Ser: 1.3 mg/dL — ABNORMAL HIGH (ref 0.61–1.24)
GFR, Estimated: 58 mL/min — ABNORMAL LOW (ref >60)
Glucose, Bld: 86 mg/dL (ref 70–99)
Potassium: 4.4 mmol/L (ref 3.5–5.1)
Sodium: 140 mmol/L (ref 135–145)
Total Bilirubin: 0.5 mg/dL (ref 0.3–1.2)
Total Protein: 6.5 g/dL (ref 6.5–8.1)

## 2022-10-11 LAB — CBC
HCT: 42.1 % (ref 39.0–52.0)
Hemoglobin: 14.6 g/dL (ref 13.0–17.0)
MCH: 32.4 pg (ref 26.0–34.0)
MCHC: 34.7 g/dL (ref 30.0–36.0)
MCV: 93.3 fL (ref 80.0–100.0)
Platelets: 133 10*3/uL — ABNORMAL LOW (ref 150–400)
RBC: 4.51 MIL/uL (ref 4.22–5.81)
RDW: 13.9 % (ref 11.5–15.5)
WBC: 4 10*3/uL (ref 4.0–10.5)
nRBC: 0 % (ref 0.0–0.2)

## 2022-10-11 LAB — TSH: TSH: 4.219 u[IU]/mL (ref 0.350–4.500)

## 2022-10-11 NOTE — Progress Notes (Signed)
Patient doing well with no concerns. He is now taking amiodarone once daily.   Labs drawn today.  ECG today shows sinus bradycardia at 53 bpm with stable QT. ECG is similar to previous.  Vent. rate 53 BPM PR interval 188 ms QRS duration 98 ms QT/QTcB 450/422 ms   Pt will follow up as scheduled with Dr. Lalla Brothers on Wednesday.

## 2022-10-12 NOTE — Progress Notes (Signed)
Electrophysiology Office Note:    Date:  10/13/2022   ID:  Steven Glover, DOB 05-19-1951, MRN [id number]  [facility] Cardiologist:  Dietrich Pates, MD  [facility] Electrophysiologist:  Lanier Prude, MD   Referring MD: Eustace Pen, PA-C   Chief Complaint: AF  History of Present Illness:    Steven Glover is a 72 y.o. male who I am seeing today for an evaluation of AF at the request of Steven Glover. He saw Steven Glover 09/27/2022. The patient has a history of pAF, hiatal hernia, CAD, GERD, RA. His AF was discovered when he presented for a urologic procedure in April 2024. A heart monitor was ordered that ultimately showed 16% AF. He was started on Eliquis and amiodarone in 09/2022 as a bridge to ablation.  He is with family today in clinic.  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  He is very concerned about the risk of bleeding associated with indefinite anticoagulation use.  He is highly symptomatic when in atrial fibrillation with shortness of breath and fatigue.  He does not really appreciate palpitations associate with atrial fibrillation, it is more the shortness of breath and fatigue.          Their past medical, social and family history was reveiwed.   ROS:   Please see the history of present illness.    All other systems reviewed and are negative.  EKGs/Labs/Other Studies Reviewed:    The following studies were reviewed today:  09/29/2022 echo EF 60 RV normal Trivial MR   09/27/2022 Zio AF burden 16%   10/11/2022 ECG shows sinus bradycardia        Physical Exam:    VS:  BP 132/84 (BP Location: Left Arm, Patient Position: Sitting, Cuff Size: Normal)   Pulse (!) 57   Ht 6\' 3"  (1.905 m)   Wt 198 lb 12.8 oz (90.2 kg)   SpO2 98%   BMI 24.85 kg/m     Wt Readings from Last 3 Encounters:  10/13/22 198 lb 12.8 oz (90.2 kg)  09/27/22 199 lb 9.6 oz (90.5 kg)  08/30/22 204 lb (92.5 kg)     GEN:  Well nourished, well  developed in no acute distress.  Appears younger than stated age CARDIAC: RRR, no murmurs, rubs, gallops RESPIRATORY:  Clear to auscultation without rales, wheezing or rhonchi       ASSESSMENT AND PLAN:    1. Paroxysmal atrial fibrillation (HCC)     #Paroxysmal AF #AFL #High risk drug monitoring - Amiodarone Maintaining sinus rhythm.   On eliquis for stroke ppx.  Discussed treatment options today for AF including antiarrhythmic drug therapy and ablation. Discussed risks, recovery and likelihood of success with each treatment strategy. Risk, benefits, and alternatives to EP study and radiofrequency ablation for afib were discussed. These risks include but are not limited to stroke, bleeding, vascular damage, tamponade, perforation, damage to the esophagus, lungs, and other structures, pulmonary vein stenosis, worsening renal function, and death.  Discussed potential need for repeat ablation procedures and antiarrhythmic drugs after an initial ablation. The patient understands these risk and wishes to proceed.  We will therefore proceed with catheter ablation at the next available time.  Carto, ICE, anesthesia are requested for the procedure.  Will also obtain CT PV protocol prior to the procedure to exclude LAA thrombus and further evaluate atrial anatomy.   -----------------------  I have seen Steven Glover in the office today who is being  considered for a Watchman left atrial appendage closure device. I believe they will benefit from this procedure given their history of atrial fibrillation, CHA2DS2-VASc score of 2 and unadjusted ischemic stroke rate of 2.2% per year.  The patient has a strong desire to avoid the long-term exposure anticoagulation given the associated increased bleeding risk given his active lifestyle.  The patient's chart has been reviewed and I feel that they would be a candidate for short term oral anticoagulation after Watchman implant.   It is my belief that  after undergoing a LAA closure procedure, Steven Glover will not need long term anticoagulation which eliminates anticoagulation side effects and major bleeding risk.   Procedural risks for the Watchman implant have been reviewed with the patient including a 0.5% risk of stroke, <1% risk of perforation and <1% risk of device embolization. Other risks include bleeding, vascular damage, tamponade, worsening renal function, and death. The patient understands these risk and wishes to proceed.     The published clinical data on the safety and effectiveness of WATCHMAN include but are not limited to the following: - Holmes DR, Everlene Farrier, Sick P et al. for the PROTECT AF Investigators. Percutaneous closure of the left atrial appendage versus warfarin therapy for prevention of stroke in patients with atrial fibrillation: a randomised non-inferiority trial. Lancet 2009; 374: 534-42. Everlene Farrier, Doshi SK, Isa Rankin D et al. on behalf of the PROTECT AF Investigators. Percutaneous Left Atrial Appendage Closure for Stroke Prophylaxis in Patients With Atrial Fibrillation 2.3-Year Follow-up of the PROTECT AF (Watchman Left Atrial Appendage System for Embolic Protection in Patients With Atrial Fibrillation) Trial. Circulation 2013; 127:720-729. - Alli O, Doshi S,  Kar S, Reddy VY, Sievert H et al. Quality of Life Assessment in the Randomized PROTECT AF (Percutaneous Closure of the Left Atrial Appendage Versus Warfarin Therapy for Prevention of Stroke in Patients With Atrial Fibrillation) Trial of Patients at Risk for Stroke With Nonvalvular Atrial Fibrillation. J Am Coll Cardiol 2013; 61:1790-8. Aline August DR, Mia [place], Price M, Whisenant B, Sievert H, Doshi S, Huber K, Reddy V. Prospective randomized evaluation of the Watchman left atrial appendage Device in patients with atrial fibrillation versus long-term warfarin therapy; the PREVAIL trial. Journal of the [employer] of Cardiology, Vol. 4, No. 1, 2014,  1-11. - Kar S, Doshi SK, Sadhu A, Horton R, Osorio J et al. Primary outcome evaluation of a next-generation left atrial appendage closure device: results from the PINNACLE FLX trial. Circulation 2021;143(18)1754-1762.    After today's visit with the patient which was dedicated solely for shared decision making visit regarding LAA closure device, the patient decided to proceed with the LAA appendage closure procedure scheduled to be done in the near future [facility]. Prior to the procedure, I would like to obtain a gated CT scan of the chest with contrast timed for PV/LA visualization.    HAS-BLED score 1 Hypertension No  Abnormal renal and liver function (Dialysis, transplant, Cr >2.26 mg/dL /Cirrhosis or Bilirubin >2x Normal or AST/ALT/AP >3x Normal) No  Stroke No  Bleeding No Labile INR (Unstable/high INR) No  Elderly (>65) Yes  Drugs or alcohol (? 8 drinks/week, anti-plt or NSAID) No   CHA2DS2-VASc Score = 2  The patient's score is based upon: CHF History: 0 HTN History: 0 Diabetes History: 0 Stroke History: 0 Vascular Disease History: 1 Age Score: 1 Gender Score: 0        Signed, Jester Klingberg T. Lalla Brothers, MD, [facility], [facility] 10/13/2022  1:54 PM    Electrophysiology [facility]

## 2022-10-13 ENCOUNTER — Encounter: Payer: Self-pay | Admitting: Cardiology

## 2022-10-13 ENCOUNTER — Ambulatory Visit: Payer: Medicare Other | Attending: Cardiology | Admitting: Cardiology

## 2022-10-13 VITALS — BP 132/84 | HR 57 | Ht 75.0 in | Wt 198.8 lb

## 2022-10-13 DIAGNOSIS — I480 Paroxysmal atrial fibrillation: Secondary | ICD-10-CM | POA: Diagnosis present

## 2022-10-13 NOTE — Patient Instructions (Addendum)
Medication Instructions:  Your physician recommends that you continue on your current medications as directed. Please refer to the Current Medication list given to you today.  *If you need a refill on your cardiac medications before your next appointment, please call your pharmacy*  Lab Work: BMET and CBC prior to ablation  Testing/Procedures: Your physician has requested that you have cardiac CT. Cardiac computed tomography (CT) is a painless test that uses an x-ray machine to take clear, detailed pictures of your heart. For further information please visit [email or web address] Please follow instruction sheet as given. This will be done one week prior to your ablation. We will call you to schedule.   Your physician has recommended that you have an ablation. Catheter ablation is a medical procedure used to treat some cardiac arrhythmias (irregular heartbeats). During catheter ablation, a long, thin, flexible tube is put into a blood vessel in your groin (upper thigh), or neck. This tube is called an ablation catheter. It is then guided to your heart through the blood vessel. Radio frequency waves destroy small areas of heart tissue where abnormal heartbeats may cause an arrhythmia to start. You are scheduled for Atrial Fibrillation Ablation on Tuesday, July 30 with Dr. Steffanie Dunn.Please arrive at the Main Entrance A at [facility] [place]: [address] [place], [place] [address] at 10:30 AM    Follow-Up: At [facility], you and your health needs are our priority.  As part of our continuing mission to provide you with exceptional heart care, we have created designated Provider Care Teams.  These Care Teams include your primary Cardiologist (physician) and Advanced Practice Providers (APPs -  Physician Assistants and Nurse Practitioners) who all work together to provide you with the care you need, when you need it.  Your next appointment:   We will call you to arrange your follow up  appointments.  Other Instructions You will be contacted by Nurse Navigator, Karsten Fells in regards to the [facility] Device. If you have any questions she can be reached at [phone].

## 2022-10-20 ENCOUNTER — Other Ambulatory Visit (HOSPITAL_COMMUNITY): Payer: Self-pay | Admitting: Internal Medicine

## 2022-11-24 ENCOUNTER — Other Ambulatory Visit: Payer: Self-pay | Admitting: Family Medicine

## 2022-11-29 ENCOUNTER — Other Ambulatory Visit (HOSPITAL_COMMUNITY)
Admission: RE | Admit: 2022-11-29 | Discharge: 2022-11-29 | Disposition: A | Discharge status: Home / Self Care | Payer: Medicare Other | Source: Ambulatory Visit | Attending: Cardiology | Admitting: Cardiology

## 2022-11-29 DIAGNOSIS — I480 Paroxysmal atrial fibrillation: Secondary | ICD-10-CM | POA: Insufficient documentation

## 2022-11-29 LAB — BASIC METABOLIC PANEL
Anion gap: 7 (ref 5–15)
BUN: 18 mg/dL (ref 8–23)
CO2: 24 mmol/L (ref 22–32)
Calcium: 8.8 mg/dL — ABNORMAL LOW (ref 8.9–10.3)
Chloride: 105 mmol/L (ref 98–111)
Creatinine, Ser: 1.38 mg/dL — ABNORMAL HIGH (ref 0.61–1.24)
GFR, Estimated: 54 mL/min — ABNORMAL LOW (ref >60)
Glucose, Bld: 93 mg/dL (ref 70–99)
Potassium: 4.1 mmol/L (ref 3.5–5.1)
Sodium: 136 mmol/L (ref 135–145)

## 2022-11-29 LAB — CBC WITH DIFFERENTIAL/PLATELET
Abs Immature Granulocytes: 0.01 10*3/uL (ref 0.00–0.07)
Basophils Absolute: 0.1 10*3/uL (ref 0.0–0.1)
Basophils Relative: 2 %
Eosinophils Absolute: 0.1 10*3/uL (ref 0.0–0.5)
Eosinophils Relative: 3 %
HCT: 42.5 % (ref 39.0–52.0)
Hemoglobin: 14.8 g/dL (ref 13.0–17.0)
Immature Granulocytes: 0 %
Lymphocytes Relative: 15 %
Lymphs Abs: 0.5 10*3/uL — ABNORMAL LOW (ref 0.7–4.0)
MCH: 33.2 pg (ref 26.0–34.0)
MCHC: 34.8 g/dL (ref 30.0–36.0)
MCV: 95.3 fL (ref 80.0–100.0)
Monocytes Absolute: 0.7 10*3/uL (ref 0.1–1.0)
Monocytes Relative: 20 %
Neutro Abs: 2.3 10*3/uL (ref 1.7–7.7)
Neutrophils Relative %: 60 %
Platelets: 119 10*3/uL — ABNORMAL LOW (ref 150–400)
RBC: 4.46 MIL/uL (ref 4.22–5.81)
RDW: 14.2 % (ref 11.5–15.5)
WBC: 3.7 10*3/uL — ABNORMAL LOW (ref 4.0–10.5)
nRBC: 0 % (ref 0.0–0.2)

## 2022-12-14 ENCOUNTER — Ambulatory Visit (HOSPITAL_COMMUNITY)
Admission: RE | Admit: 2022-12-14 | Discharge: 2022-12-14 | Disposition: A | Discharge status: Home / Self Care | Payer: Medicare Other | Source: Ambulatory Visit | Attending: Cardiology | Admitting: Cardiology

## 2022-12-14 DIAGNOSIS — I480 Paroxysmal atrial fibrillation: Secondary | ICD-10-CM | POA: Insufficient documentation

## 2022-12-14 MED ORDER — IOHEXOL 350 MG/ML SOLN
95.0000 mL | Freq: Once | INTRAVENOUS | Status: AC | PRN
  Administered 2022-12-14: 95 mL via INTRAVENOUS

## 2022-12-16 ENCOUNTER — Encounter: Payer: Self-pay | Admitting: Emergency Medicine

## 2022-12-16 ENCOUNTER — Ambulatory Visit
Admission: EM | Admit: 2022-12-16 | Discharge: 2022-12-16 | Disposition: A | Discharge status: Home / Self Care | Payer: Medicare Other | Attending: Internal Medicine | Admitting: Internal Medicine

## 2022-12-16 DIAGNOSIS — L02416 Cutaneous abscess of left lower limb: Secondary | ICD-10-CM

## 2022-12-16 MED ORDER — DOXYCYCLINE HYCLATE 100 MG PO CAPS: 100.0000 mg | ORAL_CAPSULE | Freq: Two times a day (BID) | ORAL | 0 refills | Status: DC

## 2022-12-16 NOTE — ED Provider Notes (Signed)
[facility]    CSN: [id number] Arrival date & time: 12/16/22  1602      History   Chief Complaint No chief complaint on file.   HPI Steven Glover is a 72 y.o. male presents with swelling and redness of L ball of his foot right above a callus area he has had for a few years and has had the foot doctor carver down the callus. His wife noticed a dark area that was right above it and has moved closer to the toe with cloudy matter under the skin. He was never told what this callus is from, but developed it after having foot surgery.     Past Medical History:  Diagnosis Date   Acid reflux disease    Allergy    Asthma    as child   BMI between 19-24,adult JUL 2012 191.8 LBS   Collagen vascular disease (HCC)    GERD (gastroesophageal reflux disease)    Internal hemorrhoids with other complication    TCS DEC 2013   Pulmonary nodule 2013   [facility] - Repeat 08/2012   Rheumatoid arthritis(714.0)     Patient Active Problem List   Diagnosis Date Noted   Paroxysmal atrial fibrillation (HCC) 09/27/2022   Hypercoagulable state due to paroxysmal atrial fibrillation (HCC) 09/27/2022   Panlobular emphysema (HCC) 08/30/2022   Aortic atherosclerosis (HCC) 08/30/2022   Hiatal hernia 03/01/2022   Abnormal CT scan, gastrointestinal tract 03/01/2022   Myalgia due to statin 06/12/2021   Hx of colonic polyps    Thrombocytopenia (HCC) 06/23/2016   BPH (benign prostatic hyperplasia) 01/28/2016   Dyslipidemia (high LDL; low HDL) 01/28/2016   Internal hemorrhoids with complication 12/14/2012   GERD (gastroesophageal reflux disease) 04/21/2011   Colon cancer screening 04/21/2011   Dysphagia 12/30/2010    Past Surgical History:  Procedure Laterality Date   COLONOSCOPY  2009 NUR [facility]   MULTIPLE SIMPLE ADENOMAS   COLONOSCOPY N/A 05/02/2017   Dr. Darrick Penna: 4 simple adenomas removed, diverticulosis.  Next colonoscopy in 3 years.   COLONOSCOPY W/ POLYPECTOMY  2010 NUR   COLONOSCOPY  WITH PROPOFOL  05/15/2012   SLF:Two sessile polyps ranging between 3-4mm in size were found in the ascending colon and rectum; multiple biopsies were performed/Mild diverticulosis was noted in the sigmoid colon/The colon mucosa was otherwise normal/ Moderate sized internal hemorrhoids. TCS 04/2017.   COLONOSCOPY WITH PROPOFOL N/A 04/29/2020   Procedure: COLONOSCOPY WITH PROPOFOL;  Surgeon: Lanelle Bal, DO;  [facility];  Service: Endoscopy;  Laterality: N/A;  7:30am   ESOPHAGOGASTRODUODENOSCOPY  01/01/2011 [id number] CHRONIC GASTRITIS   SLF: Hiatal Hernia/mild gastritis/stricture in the distal esophagus   FOOT SURGERY     POLYPECTOMY  05/15/2012   SIMPLE ADENOMA(2)   POLYPECTOMY  04/29/2020   Procedure: POLYPECTOMY;  Surgeon: Lanelle Bal, DO;  [facility]: [facility];  Service: Endoscopy;;   SAVORY DILATION  01/01/2011   Procedure: SAVORY DILATION;  Surgeon: Arlyce Harman, MD;  Location: [facility];  Service: Endoscopy;  Laterality: N/A;   SHOULDER OPEN ROTATOR CUFF REPAIR  2005   WRIST FUSION         Home Medications    Prior to Admission medications   Medication Sig Start Date End Date Taking? Authorizing Provider  doxycycline (VIBRAMYCIN) 100 MG capsule Take 1 capsule (100 mg total) by mouth 2 (two) times daily. 12/16/22  Yes Rodriguez-Southworth, Nettie Elm, PA-C  acetaminophen (TYLENOL) 325 MG tablet Take 2 tablets (650 mg total) by mouth  every 6 (six) hours as needed. 07/06/21   Wallis Bamberg, PA-C  amiodarone (PACERONE) 200 MG tablet Take 1 tablet (200 mg total) by mouth daily. 10/20/22   Eustace Pen, PA-C  apixaban (ELIQUIS) 5 MG TABS tablet Take 1 tablet (5 mg total) by mouth 2 (two) times daily. 09/27/22   Eustace Pen, PA-C  ezetimibe (ZETIA) 10 MG tablet Take 1 tablet by mouth once daily 11/24/22   Babs Sciara, MD  fexofenadine (ALLEGRA) 180 MG tablet Take 180 mg by mouth daily.    [provider]  folic acid (FOLVITE) 1 MG tablet Take 1 mg by  mouth daily.      [provider]  Golimumab (SIMPONI ARIA IV) Inject into the vein. Every 8 weeks    [provider]  methotrexate 2.5 MG tablet Take 17.5 mg by mouth every Monday. 7 tablets-Monday    [provider]  pantoprazole (PROTONIX) 40 MG tablet Take 1 tablet (40 mg total) by mouth 2 (two) times daily. 06/30/22 06/30/23  Lanelle Bal, DO    Family History Family History  Problem Relation Age of Onset   Diabetes Father    Cancer Sister        breast   Cancer Brother        "intestines"   Congestive Heart Failure Brother    Colon cancer Neg Hx    Colon polyps Neg Hx     Social History Social History   Tobacco Use   Smoking status: Never    Passive exposure: Never   Smokeless tobacco: Never  Vaping Use   Vaping status: Never Used  Substance Use Topics   Alcohol use: No   Drug use: No     Allergies   Cefprozil and Statins   Review of Systems Review of Systems  As noted in HPI Physical Exam Triage Vital Signs ED Triage Vitals  Encounter Vitals Group     BP 12/16/22 1606 117/71     Systolic BP Percentile --      Diastolic BP Percentile --      Pulse Rate 12/16/22 1606 (!) 59     Resp 12/16/22 1606 18     Temp 12/16/22 1606 97.8 F (36.6 C)     Temp Source 12/16/22 1606 Oral     SpO2 12/16/22 1606 96 %     Weight --      Height --      Head Circumference --      Peak Flow --      Pain Score 12/16/22 1609 8     Pain Loc --      Pain Education --      Exclude from Growth Chart --    No data found.  Updated Vital Signs BP 117/71 (BP Location: Right Arm)   Pulse (!) 59   Temp 97.8 F (36.6 C) (Oral)   Resp 18   SpO2 96%   Visual Acuity Right Eye Distance:   Left Eye Distance:   Bilateral Distance:    Right Eye Near:   Left Eye Near:    Bilateral Near:     Physical Exam Vitals and nursing note reviewed.  Constitutional:      General: He is not in acute distress.    Appearance: He is not toxic-appearing.   Eyes:     General: No scleral icterus.    Conjunctiva/sclera: Conjunctivae normal.  Musculoskeletal:     Cervical back: Neck supple.  Skin:  Comments: L FOOT- the ball of his foot has a  1 cm x 1 cm plantar wart with callus around it, and above it a fluctuant area with blood and pus and erythema around it, but does not go to the front of the foot.   Neurological:     Mental Status: He is alert and oriented to person, place, and time.     Comments: Has slight limp  Psychiatric:        Mood and Affect: Mood normal.        Behavior: Behavior normal.        Thought Content: Thought content normal.        Judgment: Judgment normal.      UC Treatments / Results  Labs (all labs ordered are listed, but only abnormal results are displayed) Labs Reviewed - No data to display  EKG   Radiology No results found.  Procedures Incision and Drainage  Date/Time: 12/16/2022 5:26 PM  Performed by: Garey Ham, PA-C Authorized by: Garey Ham, PA-C   Consent:    Consent obtained:  Verbal   Consent given by:  Patient   Risks discussed:  Pain and infection Universal protocol:    Patient identity confirmed:  Verbally with patient Location:    Type:  Abscess   Size:  0.5   Location: L foot. Pre-procedure details:    Procedure prep: alcohol pad. Sedation:    Sedation type:  None Anesthesia:    Anesthesia method:  None Procedure type:    Complexity:  Simple Procedure details:    Needle aspiration: yes     Needle size:  18 G   Incision types:  Stab incision   Incision depth:  Dermal   Drainage:  Purulent and bloody   Drainage amount:  Scant   Wound treatment:  Wound left open   Packing materials:  None Post-procedure details:    Procedure completion:  Tolerated well, no immediate complications Comments:     A band aid applied over the 3 mm incision I made  (including critical care time)  Medications Ordered in UC Medications - No data to  display  Initial Impression / Assessment and Plan / UC Course  I have reviewed the triage vital signs and the nursing notes.  Abscess L foot  He was placed on Doxy as noted. See instructions.   Final Clinical Impressions(s) / UC Diagnoses   Final diagnoses:  Abscess of leg without foot, left     Discharge Instructions      Soak your foot in Epson salts for 15 minutes 3-4 times a day for 4 days Follow up with your foot doctor when the infection has healed.      ED Prescriptions     Medication Sig Dispense Auth. Provider   doxycycline (VIBRAMYCIN) 100 MG capsule Take 1 capsule (100 mg total) by mouth 2 (two) times daily. 20 capsule Rodriguez-Southworth, Nettie Elm, PA-C      PDMP not reviewed this encounter.   Garey Ham, PA-C 12/16/22 1728

## 2022-12-16 NOTE — ED Triage Notes (Signed)
Sore on bottom of left foot.  Noticed area yesterday.  Has been soaking foot in Hibiclens and using neosporin on area.  States area is getting worse.

## 2022-12-16 NOTE — Discharge Instructions (Signed)
Soak your foot in Epson salts for 15 minutes 3-4 times a day for 4 days Follow up with your foot doctor when the infection has healed.

## 2022-12-17 ENCOUNTER — Telehealth: Payer: Self-pay | Admitting: Cardiology

## 2022-12-17 NOTE — Telephone Encounter (Signed)
I spoke with pt and he stated that he has a Writer on the heel of his foot that has got infected. He went to Urgent Care yesterday 7/25 and they put him on Doxycycline 100 mg/BID.   He is scheduled for an Afib Ablation on 7/30 and wanted to make sure this would not interfere with his procedure.   I spoke with Dr. Elberta Fortis (DOD) and he advised me to have the pt show up for his procedure and to notify Dr. Lalla Brothers so that he is aware and can look at it before the procedure to make sure he feels it is safe to proceed.

## 2022-12-17 NOTE — Telephone Encounter (Signed)
Left message for patient to call back  

## 2022-12-17 NOTE — Telephone Encounter (Signed)
New Message:     Patients says he is having a procedure on Tuesday. He needs to talk to April about his medicine.

## 2022-12-20 NOTE — Pre-Procedure Instructions (Signed)
Instructed patient on the following items: Arrival time 1000 Nothing to eat or drink after midnight No meds AM of procedure Responsible person to drive you home and stay with you for 24 hrs  Have you missed any doses of anti-coagulant Eliquis- takes twice a day, hasn't missed any doses.  Don't take dose in the morning.

## 2022-12-21 ENCOUNTER — Other Ambulatory Visit: Payer: Self-pay

## 2022-12-21 ENCOUNTER — Other Ambulatory Visit (HOSPITAL_COMMUNITY): Payer: Self-pay

## 2022-12-21 ENCOUNTER — Ambulatory Visit (HOSPITAL_COMMUNITY): Payer: Medicare Other | Admitting: Anesthesiology

## 2022-12-21 ENCOUNTER — Ambulatory Visit (HOSPITAL_BASED_OUTPATIENT_CLINIC_OR_DEPARTMENT_OTHER): Payer: Medicare Other | Admitting: Anesthesiology

## 2022-12-21 ENCOUNTER — Ambulatory Visit (HOSPITAL_COMMUNITY)
Admission: RE | Admit: 2022-12-21 | Discharge: 2022-12-21 | Disposition: A | Discharge status: Home / Self Care | Payer: Medicare Other | Attending: Cardiology | Admitting: Cardiology

## 2022-12-21 ENCOUNTER — Encounter (HOSPITAL_COMMUNITY)
Admission: RE | Disposition: A | Discharge status: Home / Self Care | Payer: Self-pay | Source: Home / Self Care | Attending: Cardiology

## 2022-12-21 DIAGNOSIS — J449 Chronic obstructive pulmonary disease, unspecified: Secondary | ICD-10-CM

## 2022-12-21 DIAGNOSIS — E785 Hyperlipidemia, unspecified: Secondary | ICD-10-CM | POA: Diagnosis not present

## 2022-12-21 DIAGNOSIS — I4819 Other persistent atrial fibrillation: Secondary | ICD-10-CM | POA: Diagnosis not present

## 2022-12-21 DIAGNOSIS — I480 Paroxysmal atrial fibrillation: Secondary | ICD-10-CM

## 2022-12-21 DIAGNOSIS — Z7901 Long term (current) use of anticoagulants: Secondary | ICD-10-CM | POA: Insufficient documentation

## 2022-12-21 HISTORY — PX: ATRIAL FIBRILLATION ABLATION: EP1191

## 2022-12-21 LAB — POCT ACTIVATED CLOTTING TIME: Activated Clotting Time: 324 seconds

## 2022-12-21 SURGERY — ATRIAL FIBRILLATION ABLATION
Anesthesia: General

## 2022-12-21 MED ORDER — SODIUM CHLORIDE 0.9 % IV SOLN: 250.0000 mL | INTRAVENOUS | Status: DC | PRN

## 2022-12-21 MED ORDER — ONDANSETRON HCL 4 MG/2ML IJ SOLN: 4.0000 mg | Freq: Four times a day (QID) | INTRAMUSCULAR | Status: DC | PRN

## 2022-12-21 MED ORDER — MIDAZOLAM HCL 2 MG/2ML IJ SOLN
INTRAMUSCULAR | Status: DC | PRN
  Administered 2022-12-21: .5 mg via INTRAVENOUS

## 2022-12-21 MED ORDER — ONDANSETRON HCL 4 MG/2ML IJ SOLN
INTRAMUSCULAR | Status: DC | PRN
  Administered 2022-12-21: 4 mg via INTRAVENOUS

## 2022-12-21 MED ORDER — HEPARIN SODIUM (PORCINE) 1000 UNIT/ML IJ SOLN
INTRAMUSCULAR | Status: DC | PRN
  Administered 2022-12-21: 2000 [IU] via INTRAVENOUS
  Administered 2022-12-21: 14000 [IU] via INTRAVENOUS

## 2022-12-21 MED ORDER — SODIUM CHLORIDE 0.9% FLUSH: 3.0000 mL | INTRAVENOUS | Status: DC | PRN

## 2022-12-21 MED ORDER — PROTAMINE SULFATE 10 MG/ML IV SOLN
INTRAVENOUS | Status: DC | PRN
Start: 2022-12-21 — End: 2022-12-21
  Administered 2022-12-21 (×2): 10 mg via INTRAVENOUS
  Administered 2022-12-21: 15 mg via INTRAVENOUS

## 2022-12-21 MED ORDER — SODIUM CHLORIDE 0.9% FLUSH: 3.0000 mL | Freq: Two times a day (BID) | INTRAVENOUS | Status: DC

## 2022-12-21 MED ORDER — DEXAMETHASONE SODIUM PHOSPHATE 10 MG/ML IJ SOLN
INTRAMUSCULAR | Status: DC | PRN
Start: 2022-12-21 — End: 2022-12-21
  Administered 2022-12-21: 10 mg via INTRAVENOUS

## 2022-12-21 MED ORDER — PANTOPRAZOLE SODIUM 40 MG PO TBEC
40.0000 mg | DELAYED_RELEASE_TABLET | Freq: Two times a day (BID) | ORAL | Status: DC
  Administered 2022-12-21: 40 mg via ORAL
  Filled 2022-12-21: qty 1

## 2022-12-21 MED ORDER — SODIUM CHLORIDE 0.9 % IV SOLN: INTRAVENOUS | Status: DC

## 2022-12-21 MED ORDER — ROCURONIUM BROMIDE 10 MG/ML (PF) SYRINGE
PREFILLED_SYRINGE | INTRAVENOUS | Status: DC | PRN
  Administered 2022-12-21: 60 mg via INTRAVENOUS
  Administered 2022-12-21: 20 mg via INTRAVENOUS

## 2022-12-21 MED ORDER — LIDOCAINE 2% (20 MG/ML) 5 ML SYRINGE
INTRAMUSCULAR | Status: DC | PRN
  Administered 2022-12-21: 80 mg via INTRAVENOUS

## 2022-12-21 MED ORDER — PHENYLEPHRINE HCL-NACL 20-0.9 MG/250ML-% IV SOLN
INTRAVENOUS | Status: DC | PRN
  Administered 2022-12-21: 25 ug/min via INTRAVENOUS

## 2022-12-21 MED ORDER — PHENYLEPHRINE 80 MCG/ML (10ML) SYRINGE FOR IV PUSH (FOR BLOOD PRESSURE SUPPORT)
PREFILLED_SYRINGE | INTRAVENOUS | Status: DC | PRN
Start: 2022-12-21 — End: 2022-12-21
  Administered 2022-12-21: 80 ug via INTRAVENOUS

## 2022-12-21 MED ORDER — COLCHICINE 0.6 MG PO TABS
0.6000 mg | ORAL_TABLET | Freq: Two times a day (BID) | ORAL | Status: DC
  Administered 2022-12-21: 0.6 mg via ORAL
  Filled 2022-12-21: qty 1

## 2022-12-21 MED ORDER — FENTANYL CITRATE (PF) 100 MCG/2ML IJ SOLN
INTRAMUSCULAR | Status: DC | PRN
  Administered 2022-12-21 (×2): 50 ug via INTRAVENOUS

## 2022-12-21 MED ORDER — SUGAMMADEX SODIUM 200 MG/2ML IV SOLN
INTRAVENOUS | Status: DC | PRN
  Administered 2022-12-21: 181.4 mg via INTRAVENOUS

## 2022-12-21 MED ORDER — APIXABAN 5 MG PO TABS
5.0000 mg | ORAL_TABLET | Freq: Two times a day (BID) | ORAL | Status: DC
  Administered 2022-12-21: 5 mg via ORAL
  Filled 2022-12-21: qty 1

## 2022-12-21 MED ORDER — PROPOFOL 10 MG/ML IV BOLUS
INTRAVENOUS | Status: DC | PRN
  Administered 2022-12-21: 150 mg via INTRAVENOUS

## 2022-12-21 MED ORDER — HEPARIN (PORCINE) IN NACL 1000-0.9 UT/500ML-% IV SOLN
INTRAVENOUS | Status: DC | PRN
  Administered 2022-12-21 (×3): 500 mL

## 2022-12-21 MED ORDER — HEPARIN SODIUM (PORCINE) 1000 UNIT/ML IJ SOLN
INTRAMUSCULAR | Status: DC | PRN
  Administered 2022-12-21: 1000 [IU] via INTRAVENOUS

## 2022-12-21 MED ORDER — HEPARIN SODIUM (PORCINE) 1000 UNIT/ML IJ SOLN
INTRAMUSCULAR | Status: AC
  Filled 2022-12-21: qty 10

## 2022-12-21 MED ORDER — COLCHICINE 0.6 MG PO TABS
0.6000 mg | ORAL_TABLET | Freq: Two times a day (BID) | ORAL | 0 refills | Status: DC
  Filled 2022-12-21: qty 10, 5d supply, fill #0

## 2022-12-21 MED ORDER — ACETAMINOPHEN 325 MG PO TABS: 650.0000 mg | ORAL_TABLET | ORAL | Status: DC | PRN

## 2022-12-21 SURGICAL SUPPLY — 20 items
BAG SNAP BAND KOVER 36X36 (MISCELLANEOUS) IMPLANT
BLANKET WARM UNDERBOD FULL ACC (MISCELLANEOUS) ×1 IMPLANT
CATH 8FR REPROCESSED SOUNDSTAR (CATHETERS) ×1 IMPLANT
CATH 8FR SOUNDSTAR REPROCESSED (CATHETERS) IMPLANT
CATH ABLAT QDOT MICRO BI TC DF (CATHETERS) IMPLANT
CATH OCTARAY 2.0 F 3-3-3-3-3 (CATHETERS) IMPLANT
CATH S-M CIRCA TEMP PROBE (CATHETERS) IMPLANT
CATH WEB BI DIR CSDF CRV REPRO (CATHETERS) IMPLANT
CLOSURE PERCLOSE PROSTYLE (VASCULAR PRODUCTS) IMPLANT
COVER SWIFTLINK CONNECTOR (BAG) ×1 IMPLANT
PACK EP LATEX FREE (CUSTOM PROCEDURE TRAY) ×1
PACK EP LF (CUSTOM PROCEDURE TRAY) ×1 IMPLANT
PAD DEFIB RADIO PHYSIO CONN (PAD) ×1 IMPLANT
PATCH CARTO3 (PAD) IMPLANT
SHEATH BAYLIS TRANSSEPTAL 98CM (NEEDLE) IMPLANT
SHEATH CARTO VIZIGO SM CVD (SHEATH) IMPLANT
SHEATH PINNACLE 8F 10CM (SHEATH) IMPLANT
SHEATH PINNACLE 9F 10CM (SHEATH) IMPLANT
SHEATH PROBE COVER 6X72 (BAG) IMPLANT
TUBING SMART ABLATE COOLFLOW (TUBING) IMPLANT

## 2022-12-21 NOTE — Anesthesia Preprocedure Evaluation (Addendum)
Anesthesia Evaluation  Patient identified by MRN, date of birth, ID band Patient awake    Reviewed: Allergy & Precautions, NPO status , Patient's Chart, lab work & pertinent test results  Airway Mallampati: II  TM Distance: >3 FB Neck ROM: Full    Dental  (+) Lower Dentures, Upper Dentures   Pulmonary asthma , COPD   Pulmonary exam normal        Cardiovascular Normal cardiovascular exam+ dysrhythmias Atrial Fibrillation      Neuro/Psych negative neurological ROS  negative psych ROS   GI/Hepatic Neg liver ROS, hiatal hernia,GERD  Medicated and Controlled,,  Endo/Other  negative endocrine ROS    Renal/GU negative Renal ROS     Musculoskeletal negative musculoskeletal ROS (+)    Abdominal   Peds  Hematology  (+) Blood dyscrasia (Eliquis)   Anesthesia Other Findings A-fib  Reproductive/Obstetrics                              Anesthesia Physical Anesthesia Plan  ASA: 3  Anesthesia Plan: General   Post-op Pain Management:    Induction: Intravenous  PONV Risk Score and Plan: 2 and Ondansetron, Dexamethasone and Treatment may vary due to age or medical condition  Airway Management Planned: Oral ETT  Additional Equipment:   Intra-op Plan:   Post-operative Plan: Extubation in OR  Informed Consent: I have reviewed the patients History and Physical, chart, labs and discussed the procedure including the risks, benefits and alternatives for the proposed anesthesia with the patient or authorized representative who has indicated his/her understanding and acceptance.     Dental advisory given  Plan Discussed with: CRNA  Anesthesia Plan Comments:          Anesthesia Quick Evaluation

## 2022-12-21 NOTE — H&P (Signed)
Electrophysiology Office Note:     Date:  12/21/2022    ID:  Steven Glover, DOB March 22, 1951, MRN [id number]   [facility] Cardiologist:  Dietrich Pates, MD  [facility] Electrophysiologist:  Lanier Prude, MD    Referring MD: Eustace Pen, PA-C    Chief Complaint: AF   History of Present Illness:     Steven Glover is a 72 y.o. male who I am seeing today for an evaluation of AF at the request of [name]. He saw [name] 09/27/2022. The patient has a history of pAF, hiatal hernia, CAD, GERD, RA. His AF was discovered when he presented for a urologic procedure in April 2024. A heart monitor was ordered that ultimately showed 16% AF. He was started on Eliquis and amiodarone in 09/2022 as a bridge to ablation.  He is with family today in clinic.  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  He is very concerned about the risk of bleeding associated with indefinite anticoagulation use.  He is highly symptomatic when in atrial fibrillation with shortness of breath and fatigue.  He does not really appreciate palpitations associate with atrial fibrillation, it is more the shortness of breath and fatigue.      Presents for PVI today. Procedure reviewed.         Objective Their past medical, social and family history was reveiwed.     ROS:   Please see the history of present illness.    All other systems reviewed and are negative.   EKGs/Labs/Other Studies Reviewed:     The following studies were reviewed today:   09/29/2022 echo EF 60 RV normal Trivial MR     09/27/2022 Zio AF burden 16%     10/11/2022 ECG shows sinus bradycardia               Physical Exam:     VS:  BP 150/74 (BP Location: Left Arm, Patient Position: Sitting, Cuff Size: Normal)   Pulse 55   Ht 6\' 3"  (1.905 m)   Wt 198 lb 12.8 oz (90.2 kg)   SpO2 98%   BMI 24.85 kg/m         Wt Readings from Last 3 Encounters:  10/13/22 198 lb 12.8 oz (90.2 kg)  09/27/22 199 lb 9.6  oz (90.5 kg)  08/30/22 204 lb (92.5 kg)      GEN:  Well nourished, well developed in no acute distress.  Appears younger than stated age CARDIAC: RRR, no murmurs, rubs, gallops RESPIRATORY:  Clear to auscultation without rales, wheezing or rhonchi          Assessment ASSESSMENT AND PLAN:     1. Paroxysmal atrial fibrillation (HCC)       #Paroxysmal AF #AFL #High risk drug monitoring - Amiodarone Maintaining sinus rhythm.    On eliquis for stroke ppx.   Discussed treatment options today for AF including antiarrhythmic drug therapy and ablation. Discussed risks, recovery and likelihood of success with each treatment strategy. Risk, benefits, and alternatives to EP study and radiofrequency ablation for afib were discussed. These risks include but are not limited to stroke, bleeding, vascular damage, tamponade, perforation, damage to the esophagus, lungs, and other structures, pulmonary vein stenosis, worsening renal function, and death.  Discussed potential need for repeat ablation procedures and antiarrhythmic drugs after an initial ablation. The patient understands these risk and wishes to proceed.  We will therefore proceed with catheter ablation at the next  available time.  Carto, ICE, anesthesia are requested for the procedure.  Will also obtain CT PV protocol prior to the procedure to exclude LAA thrombus and further evaluate atrial anatomy.    Presents for PVI today. Procedure reviewed.             Signed, Rossie Muskrat. Lalla Brothers, MD, [facility], [facility] 12/21/2022 Electrophysiology [facility]

## 2022-12-21 NOTE — Anesthesia Postprocedure Evaluation (Signed)
Anesthesia Post Note  Patient: Steven Glover  Procedure(s) Performed: ATRIAL FIBRILLATION ABLATION     Patient location during evaluation: PACU Anesthesia Type: General Level of consciousness: awake Pain management: pain level controlled Vital Signs Assessment: post-procedure vital signs reviewed and stable Respiratory status: spontaneous breathing, nonlabored ventilation and respiratory function stable Cardiovascular status: blood pressure returned to baseline and stable Postop Assessment: no apparent nausea or vomiting Anesthetic complications: no   There were no known notable events for this encounter.  Last Vitals:  Vitals:   12/21/22 1510 12/21/22 1515  BP: 116/69   Pulse: (!) 57   Resp: 15   Temp:  36.8 C  SpO2: 93%     Last Pain:  Vitals:   12/21/22 1530  TempSrc:   PainSc: 0-No pain                 Balen Woolum P Traeger Sultana

## 2022-12-21 NOTE — Transfer of Care (Signed)
Immediate Anesthesia Transfer of Care Note  Patient: Steven Glover  Procedure(s) Performed: ATRIAL FIBRILLATION ABLATION  Patient Location: Cath Lab  Anesthesia Type:General  Level of Consciousness: awake, alert , and oriented  Airway & Oxygen Therapy: Patient Spontanous Breathing and Patient connected to nasal cannula oxygen  Post-op Assessment: Report given to RN, Post -op Vital signs reviewed and stable, and Patient moving all extremities X 4  Post vital signs: Reviewed and stable  Last Vitals:  Vitals Value Taken Time  BP 115/68 12/21/22 1445  Temp 36.4 C 12/21/22 1445  Pulse 59 12/21/22 1446  Resp 16 12/21/22 1446  SpO2 94 % 12/21/22 1446  Vitals shown include unfiled device data.  Last Pain:  Vitals:   12/21/22 1445  TempSrc: Temporal  PainSc: Asleep         Complications: There were no known notable events for this encounter.

## 2022-12-21 NOTE — Progress Notes (Signed)
Pt ambulated to and from bathroom to void with no new signs of oozing from right groin site

## 2022-12-21 NOTE — Discharge Instructions (Signed)

## 2022-12-22 ENCOUNTER — Encounter (HOSPITAL_COMMUNITY): Payer: Self-pay | Admitting: Cardiology

## 2022-12-23 ENCOUNTER — Telehealth: Payer: Self-pay

## 2022-12-29 ENCOUNTER — Ambulatory Visit (INDEPENDENT_AMBULATORY_CARE_PROVIDER_SITE_OTHER): Payer: Medicare Other | Admitting: Internal Medicine

## 2022-12-29 ENCOUNTER — Encounter: Payer: Self-pay | Admitting: Internal Medicine

## 2022-12-29 VITALS — BP 111/71 | HR 70 | Temp 97.1°F | Ht 75.0 in | Wt 192.2 lb

## 2022-12-29 DIAGNOSIS — K449 Diaphragmatic hernia without obstruction or gangrene: Secondary | ICD-10-CM | POA: Diagnosis not present

## 2022-12-29 DIAGNOSIS — R933 Abnormal findings on diagnostic imaging of other parts of digestive tract: Secondary | ICD-10-CM

## 2022-12-29 DIAGNOSIS — K219 Gastro-esophageal reflux disease without esophagitis: Secondary | ICD-10-CM

## 2022-12-29 DIAGNOSIS — Q430 Meckel's diverticulum (displaced) (hypertrophic): Secondary | ICD-10-CM | POA: Diagnosis not present

## 2022-12-29 NOTE — Patient Instructions (Signed)
I am happy to hear that you are doing well.  Continue on pantoprazole twice daily.  I will discuss your case again with general surgery Dr. Henreitta Leber to see if we need to repeat CT imaging now.  If she thinks we do then I will order this.  We will call with results.  Otherwise follow-up in 6 months.  It is always a pleasure seeing you.  Dr. Marletta Lor

## 2022-12-29 NOTE — Progress Notes (Signed)
Referring Provider: Babs Sciara, MD Primary Care Physician:  Babs Sciara, MD Primary GI:  Dr. Marletta Lor  Chief Complaint  Patient presents with   Follow-up    Patient here today for a follow up visit. Patient denies any current gi issues. He is taking pantoprazole 40 mg bid, which is controlling his gerd symptoms.    HPI:   Steven Glover is a 72 y.o. male who presents to clinic today for follow-up visit.  Patient was seen in the ED on 02/07/22 after waking up chest pain radiating into both arms and into his back.  Also noted to have a lot of belching, increased from baseline.  Completed CTA chest/abdomen/pelvis which ruled out aortic dissection but several incidental findings inclduing several loops of distal ileum terminal closely opposed and suggested that either inflamed Meckel's diverticulum or enteroenteric fistula.  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  Moderately large sliding hiatal hernia noted as well.   I reviewed scans personally with Dr. Henreitta Leber of general surgery with plans to repeat CT scan in 2 months.  Repeat CT November 2023 showed slight improvement and fat stranding in the right lower quadrant small bowel mesentery consistent with resolving nonspecific infection or inflammation.  No visualized Meckel's diverticulum or enteroenteric fistula. Surgery recommended repeat CT for surveillance.   CT 07/02/22 with similar.  Notes of mesenteric stranding in the right lower quadrant involving the distal small bowel, nonspecific, no associated mass or adenopathy.  Patient is completely asymptomatic.  Denies any abdominal pain.  No blood or mucus in his stools.  Normal bowel movements.  Does have chronic GERD for which he is doing well on pantoprazole twice daily.   No dysphagia odynophagia.  No epigastric or chest pain.  Avoids foods which trigger his symptoms. Has a large hiatal hernia  Past Medical History:   Diagnosis Date   Acid reflux disease    Allergy    Asthma    as child   BMI between 19-24,adult JUL 2012 191.8 LBS   Collagen vascular disease (HCC)    GERD (gastroesophageal reflux disease)    Internal hemorrhoids with other complication    TCS DEC 2013   Pulmonary nodule 2013   [facility] - Repeat 08/2012   Rheumatoid arthritis(714.0)     Past Surgical History:  Procedure Laterality Date   ATRIAL FIBRILLATION ABLATION N/A 12/21/2022   Procedure: ATRIAL FIBRILLATION ABLATION;  Surgeon: Lanier Prude, MD;  Location: [facility];  Service: Cardiovascular;  Laterality: N/A;   COLONOSCOPY  2009 NUR [facility]   MULTIPLE SIMPLE ADENOMAS   COLONOSCOPY N/A 05/02/2017   Dr. Darrick Penna: 4 simple adenomas removed, diverticulosis.  Next colonoscopy in 3 years.   COLONOSCOPY W/ POLYPECTOMY  2010 NUR   COLONOSCOPY WITH PROPOFOL  05/15/2012   SLF:Two sessile polyps ranging between 3-18mm in size were found in the ascending colon and rectum; multiple biopsies were performed/Mild diverticulosis was noted in the sigmoid colon/The colon mucosa was otherwise normal/ Moderate sized internal hemorrhoids. TCS 04/2017.   COLONOSCOPY WITH PROPOFOL N/A 04/29/2020   Procedure: COLONOSCOPY WITH PROPOFOL;  Surgeon: Lanelle Bal, DO;  Location: [facility];  Service: Endoscopy;  Laterality: N/A;  7:30am   ESOPHAGOGASTRODUODENOSCOPY  01/01/2011 [id number] CHRONIC GASTRITIS   SLF: Hiatal Hernia/mild gastritis/stricture in the distal esophagus   FOOT SURGERY     POLYPECTOMY  05/15/2012   SIMPLE ADENOMA(2)   POLYPECTOMY  04/29/2020  Procedure: POLYPECTOMY;  Surgeon: Lanelle Bal, DO;  Location: [facility];  Service: Endoscopy;;   SAVORY DILATION  01/01/2011   Procedure: SAVORY DILATION;  Surgeon: Arlyce Harman, MD;  Location: [facility];  Service: Endoscopy;  Laterality: N/A;   SHOULDER OPEN ROTATOR CUFF REPAIR  2005   WRIST FUSION      Current Outpatient Medications  Medication Sig Dispense Refill    amiodarone (PACERONE) 200 MG tablet Take 1 tablet (200 mg total) by mouth daily. 30 tablet 6   apixaban (ELIQUIS) 5 MG TABS tablet Take 1 tablet (5 mg total) by mouth 2 (two) times daily. 60 tablet 3   ezetimibe (ZETIA) 10 MG tablet Take 1 tablet by mouth once daily 90 tablet 0   fexofenadine (ALLEGRA) 180 MG tablet Take 180 mg by mouth daily.     folic acid (FOLVITE) 1 MG tablet Take 1 mg by mouth daily.       Golimumab (SIMPONI ARIA IV) Inject into the vein. Every 8 weeks     methotrexate 2.5 MG tablet Take 17.5 mg by mouth every Monday. 7 tablets-Monday     pantoprazole (PROTONIX) 40 MG tablet Take 1 tablet (40 mg total) by mouth 2 (two) times daily. 180 tablet 3   No current facility-administered medications for this visit.    Allergies as of 12/29/2022 - Review Complete 12/29/2022  Allergen Reaction Noted   Cefprozil  02/21/2015   Statins  11/30/2018    Family History  Problem Relation Age of Onset   Diabetes Father    Cancer Sister        breast   Cancer Brother        "intestines"   Congestive Heart Failure Brother    Colon cancer Neg Hx    Colon polyps Neg Hx     Social History   Socioeconomic History   Marital status: Married    Spouse name: Not on file   Number of children: Not on file   Years of education: Not on file   Highest education level: Not on file  Occupational History   Not on file  Tobacco Use   Smoking status: Never    Passive exposure: Never   Smokeless tobacco: Never  Vaping Use   Vaping status: Never Used  Substance and Sexual Activity   Alcohol use: No   Drug use: No   Sexual activity: Yes    Birth control/protection: None  Other Topics Concern   Not on file  Social History Narrative   MARRIED-Daughter, [name], nurse ([employer]), 2 CHILDREN   Social Determinants of Health   Financial Resource Strain: Not on file  Food Insecurity: Not on file  Transportation Needs: Not on file  Physical Activity: Not on file  Stress: Not on  file  Social Connections: Not on file    Subjective: Review of Systems  Constitutional:  Negative for chills and fever.  HENT:  Negative for congestion and hearing loss.   Eyes:  Negative for blurred vision and double vision.  Respiratory:  Negative for cough and shortness of breath.   Cardiovascular:  Negative for chest pain and palpitations.  Gastrointestinal:  Positive for heartburn. Negative for abdominal pain, blood in stool, constipation, diarrhea, melena and vomiting.  Genitourinary:  Negative for dysuria and urgency.  Musculoskeletal:  Negative for joint pain and myalgias.  Skin:  Negative for itching and rash.  Neurological:  Negative for dizziness and headaches.  Psychiatric/Behavioral:  Negative for depression. The  patient is not nervous/anxious.      Objective: BP 111/71 (BP Location: Left Arm, Patient Position: Sitting, Cuff Size: Normal)   Pulse 70   Temp (!) 97.1 F (36.2 C) (Temporal)   Ht 6\' 3"  (1.905 m)   Wt 192 lb 3.2 oz (87.2 kg)   BMI 24.02 kg/m  Physical Exam Constitutional:      Appearance: Normal appearance.  HENT:     Head: Normocephalic and atraumatic.  Eyes:     Extraocular Movements: Extraocular movements intact.     Conjunctiva/sclera: Conjunctivae normal.  Cardiovascular:     Rate and Rhythm: Normal rate and regular rhythm.  Pulmonary:     Effort: Pulmonary effort is normal.     Breath sounds: Normal breath sounds.  Abdominal:     General: Bowel sounds are normal.     Palpations: Abdomen is soft.  Musculoskeletal:        General: Normal range of motion.     Cervical back: Normal range of motion and neck supple.  Skin:    General: Skin is warm.  Neurological:     General: No focal deficit present.     Mental Status: He is alert and oriented to person, place, and time.  Psychiatric:        Mood and Affect: Mood normal.        Behavior: Behavior normal.      Assessment: *Chronic GERD-well-controlled *Abnormal CT of the  abdomen-mesenteric inflammation, questionable Meckel's diverticulum versus  Plan: Patient's chronic GERD well-controlled on pantoprazole twice daily.  We will continue  In regards to his abnormal CT findings, I once again discussed case with general surgery. No evidence of mass or concerning area, just fat inflammation.  Given he is asymptomatic, I do not think we need to pursue further surveillance imaging.    Follow-up in 6 months.  12/29/2022 11:00 AM   Disclaimer: This note was dictated with voice recognition software. Similar sounding words can inadvertently be transcribed and may not be corrected upon review.

## 2023-01-03 ENCOUNTER — Ambulatory Visit: Payer: Medicare Other | Attending: Internal Medicine | Admitting: Cardiovascular Disease

## 2023-01-03 DIAGNOSIS — G4733 Obstructive sleep apnea (adult) (pediatric): Secondary | ICD-10-CM | POA: Insufficient documentation

## 2023-01-03 DIAGNOSIS — I480 Paroxysmal atrial fibrillation: Secondary | ICD-10-CM | POA: Insufficient documentation

## 2023-01-09 ENCOUNTER — Other Ambulatory Visit (HOSPITAL_COMMUNITY): Payer: Self-pay | Admitting: Internal Medicine

## 2023-01-12 NOTE — Procedures (Signed)
    [facility]           Patient Name: Steven Glover, Steven Glover Date: 01/03/2023 Gender: Male D.O.B: 1950/10/06 Age (years): 72 Referring Provider: Eustace Pen PA-C Height (inches): 75 Interpreting Physician: Nicki Guadalajara MD, ABSM Weight (lbs): 192 RPSGT: Alfonso Ellis BMI: 24 MRN: [id number] Neck Size: <br>  CLINICAL INFORMATION Sleep Study Type: HST  Indication for sleep study: PAF, Snoring  Epworth Sleepiness Score: 5  SLEEP STUDY TECHNIQUE A multi-channel overnight portable sleep study was performed. The channels recorded were: nasal airflow, thoracic respiratory movement, and oxygen saturation with a pulse oximetry. Snoring was also monitored.  MEDICATIONS amiodarone (PACERONE) 200 MG tablet apixaban (ELIQUIS) 5 MG TABS tablet ezetimibe (ZETIA) 10 MG tablet fexofenadine (ALLEGRA) 180 MG tablet folic acid (FOLVITE) 1 MG tablet Golimumab (SIMPONI ARIA IV) methotrexate 2.5 MG tablet pantoprazole (PROTONIX) 40 MG tablet Patient self administered medications include: N/A.  SLEEP ARCHITECTURE Patient was studied for 163 minutes. The sleep efficiency was 33.0 % and the patient was supine for 0%. The arousal index was 0.0 per hour.  RESPIRATORY PARAMETERS The overall AHI was 14.0 per hour, with a central apnea index of 0 per hour. There is a positional component with moderate OSA with supine sleep (AHI 23.1/h) versus non-supine sleep (AHI 6.2/h).  The oxygen nadir was 90% during sleep.  CARDIAC DATA Mean heart rate during sleep was 58.8 bpm. Slowest HR was 52. Reported maximal rate 165, not verified.  IMPRESSIONS - Mild obstructive sleep apnea occurred during this study (AHI 14.0/h); however, modrate OSA with supine sleep (AHI 23.1/h). - The patient had no oxygen desaturation during the study (Min O2 90%) - Minimal non audible snoring.  DIAGNOSIS - Obstructive Sleep Apnea (G47.33)  RECOMMENDATIONS - In this patient with cardiovascular  co-morbidities recommend therapeutic CPAP titration to determine optimal pressure required to alleviate sleep disordered breathing. If unable for an in-lab study, initiate Auto-PAP with EPR of 3 at 6 - 16 cm of water. - Effort should be made to optimize nasal and oropharyngeal patency. - Positional therapy avoiding supine position during sleep. - If patient is against CPAP initiation, a customized oral appliance may be considered. - Avoid alcohol, sedatives and other CNS depressants that may worsen sleep apnea and disrupt normal sleep architecture. - Sleep hygiene should be reviewed to assess factors that may improve sleep quality. - Weight management and regular exercise should be initiated or continued. - Recommend a dowload and sleep clinic evaluation after 4 - 6 weeks of therapy.   [Electronically signed] 01/12/2023 05:16 PM  Nicki Guadalajara MD, [facility], ABSM Diplomate, American Board of Sleep Medicine  NPI: [id number]   SLE[facility] PH: [phone]   FX: [phone] ACCREDITED BY THE AMERICAN ACADEMY OF SLEEP MEDICINE

## 2023-01-17 ENCOUNTER — Other Ambulatory Visit: Payer: Self-pay

## 2023-01-17 DIAGNOSIS — I480 Paroxysmal atrial fibrillation: Secondary | ICD-10-CM

## 2023-01-18 ENCOUNTER — Ambulatory Visit (HOSPITAL_COMMUNITY): Admission: RE | Admit: 2023-01-18 | Payer: Medicare Other | Source: Ambulatory Visit | Admitting: Physician Assistant

## 2023-01-18 ENCOUNTER — Encounter (HOSPITAL_COMMUNITY): Payer: Self-pay | Admitting: Physician Assistant

## 2023-01-18 VITALS — BP 114/74 | HR 54 | Ht 75.0 in | Wt 194.0 lb

## 2023-01-18 DIAGNOSIS — D6869 Other thrombophilia: Secondary | ICD-10-CM | POA: Insufficient documentation

## 2023-01-18 DIAGNOSIS — Z7901 Long term (current) use of anticoagulants: Secondary | ICD-10-CM | POA: Insufficient documentation

## 2023-01-18 DIAGNOSIS — I2510 Atherosclerotic heart disease of native coronary artery without angina pectoris: Secondary | ICD-10-CM | POA: Diagnosis not present

## 2023-01-18 DIAGNOSIS — K449 Diaphragmatic hernia without obstruction or gangrene: Secondary | ICD-10-CM | POA: Diagnosis not present

## 2023-01-18 DIAGNOSIS — I480 Paroxysmal atrial fibrillation: Secondary | ICD-10-CM | POA: Diagnosis present

## 2023-01-18 DIAGNOSIS — K219 Gastro-esophageal reflux disease without esophagitis: Secondary | ICD-10-CM | POA: Diagnosis not present

## 2023-01-18 DIAGNOSIS — Z79899 Other long term (current) drug therapy: Secondary | ICD-10-CM

## 2023-01-18 DIAGNOSIS — Z5181 Encounter for therapeutic drug level monitoring: Secondary | ICD-10-CM

## 2023-01-18 DIAGNOSIS — E785 Hyperlipidemia, unspecified: Secondary | ICD-10-CM | POA: Diagnosis not present

## 2023-01-18 DIAGNOSIS — G4733 Obstructive sleep apnea (adult) (pediatric): Secondary | ICD-10-CM | POA: Insufficient documentation

## 2023-01-18 LAB — CBC
HCT: 41.2 % (ref 39.0–52.0)
Hemoglobin: 13.9 g/dL (ref 13.0–17.0)
MCH: 32.4 pg (ref 26.0–34.0)
MCHC: 33.7 g/dL (ref 30.0–36.0)
MCV: 96 fL (ref 80.0–100.0)
Platelets: 134 10*3/uL — ABNORMAL LOW (ref 150–400)
RBC: 4.29 MIL/uL (ref 4.22–5.81)
RDW: 13.8 % (ref 11.5–15.5)
WBC: 4.9 10*3/uL (ref 4.0–10.5)
nRBC: 0 % (ref 0.0–0.2)

## 2023-01-18 LAB — BASIC METABOLIC PANEL
Anion gap: 14 (ref 5–15)
BUN: 13 mg/dL (ref 8–23)
CO2: 21 mmol/L — ABNORMAL LOW (ref 22–32)
Calcium: 9 mg/dL (ref 8.9–10.3)
Chloride: 106 mmol/L (ref 98–111)
Creatinine, Ser: 1.26 mg/dL — ABNORMAL HIGH (ref 0.61–1.24)
GFR, Estimated: 60 mL/min — ABNORMAL LOW (ref >60)
Glucose, Bld: 111 mg/dL — ABNORMAL HIGH (ref 70–99)
Potassium: 3.9 mmol/L (ref 3.5–5.1)
Sodium: 141 mmol/L (ref 135–145)

## 2023-01-18 NOTE — Progress Notes (Signed)
Primary Care Physician: Babs Sciara, MD Primary Cardiologist: Dr. Tenny Craw Primary Electrophysiologist: Dr Lalla Brothers Referring Physician: Dr. Edison Nasuti [name] is a 72 y.o. male with a history of mild nonobstructive CAD, HLD, hiatal hernia, GERD, rheumatoid arthritis, bladder tumor, and paroxysmal atrial fibrillation who presents for follow up in the [facility]. His ureteroscopy was canceled by Urology due to HR in the 150s. Saw cardiology 4/8 and cardiac monitor placed and echo ordered. Cardiac monitor showed SVT run of 203 bpm that lasted 60 seconds and also showed 16% Afib burden. He has a CHADS2VASC score of 2. He was started on amiodarone as a bridge to ablation. He is s/p afib ablation 12/21/22 with Dr Lalla Brothers.  On follow up today, patient reports that he has done well since his ablation. He has not had any intermittent dizziness like he did before the ablation. He remains in SR. He denies chest pain, swallowing pain, or groin issues.   Today, he denies symptoms of palpitations, chest pain, shortness of breath, orthopnea, PND, lower extremity edema, dizziness, presyncope, syncope, snoring, daytime somnolence, bleeding, or neurologic sequela. The patient is tolerating medications without difficulties and is otherwise without complaint today.   Atrial Fibrillation Risk Factors:  he does have symptoms or diagnosis of sleep apnea. he does not have a history of rheumatic fever. he does not have a history of alcohol use. The patient does not have a history of early familial atrial fibrillation or other arrhythmias.   Atrial Fibrillation Management history:  Previous antiarrhythmic drugs: amiodarone  Previous cardioversions: None Previous ablations: None Anticoagulation history: Eliquis   Past Medical History:  Diagnosis Date   Acid reflux disease    Allergy    Asthma    as child   BMI between 19-24,adult JUL 2012 191.8 LBS   Collagen  vascular disease (HCC)    GERD (gastroesophageal reflux disease)    Internal hemorrhoids with other complication    TCS DEC 2013   Pulmonary nodule 2013   [facility] - Repeat 08/2012   Rheumatoid arthritis(714.0)     Current Outpatient Medications  Medication Sig Dispense Refill   amiodarone (PACERONE) 200 MG tablet Take 1 tablet (200 mg total) by mouth daily. 30 tablet 6   apixaban (ELIQUIS) 5 MG TABS tablet Take 1 tablet by mouth twice daily 60 tablet 6   ezetimibe (ZETIA) 10 MG tablet Take 1 tablet by mouth once daily 90 tablet 0   fexofenadine (ALLEGRA) 180 MG tablet Take 180 mg by mouth daily.     folic acid (FOLVITE) 1 MG tablet Take 1 mg by mouth daily.       Golimumab (SIMPONI ARIA IV) Inject into the vein. Every 8 weeks     methotrexate 2.5 MG tablet Take 17.5 mg by mouth every Monday. 7 tablets-Monday     pantoprazole (PROTONIX) 40 MG tablet Take 1 tablet (40 mg total) by mouth 2 (two) times daily. 180 tablet 3   No current facility-administered medications for this encounter.    ROS- All systems are reviewed and negative except as per the HPI above.  Physical Exam: Vitals:   01/18/23 1349  BP: 114/74  Pulse: (!) 54  Weight: 88 kg  Height: 6\' 3"  (1.905 m)    GEN: Well nourished, well developed in no acute distress NECK: No JVD; No carotid bruits CARDIAC: Regular rate and rhythm, no murmurs, rubs, gallops RESPIRATORY:  Clear to auscultation without rales, wheezing or rhonchi  ABDOMEN: Soft, non-tender, non-distended EXTREMITIES:  No edema; No deformity    Wt Readings from Last 3 Encounters:  01/18/23 88 kg  12/29/22 87.2 kg  12/21/22 90.7 kg    EKG today demonstrates  SB Vent. rate 54 BPM PR interval 188 ms QRS duration 94 ms QT/QTcB 468/443 ms   Echo 09/29/22  1. Left ventricular ejection fraction, by estimation, is 60 to 65%. The  left ventricle has normal function. The left ventricle has no regional  wall motion abnormalities. Left ventricular diastolic  parameters were  normal.   2. Right ventricular systolic function is normal. The right ventricular  size is normal. Tricuspid regurgitation signal is inadequate for assessing  PA pressure.   3. The mitral valve is grossly normal. Trivial mitral valve  regurgitation.   4. The aortic valve is tricuspid. Aortic valve regurgitation is trivial.  Aortic valve sclerosis is present, with no evidence of aortic valve  stenosis. Aortic valve mean gradient measures 7.0 mmHg.   5. The inferior vena cava is normal in size with greater than 50%  respiratory variability, suggesting right atrial pressure of 3 mmHg.    Epic records are reviewed at length today.  HAS-BLED score 1 Hypertension No  Abnormal renal and liver function (Dialysis, transplant, Cr >2.26 mg/dL /Cirrhosis or Bilirubin >2x Normal or AST/ALT/AP >3x Normal) No  Stroke No  Bleeding No Labile INR (Unstable/high INR) No  Elderly (>65) Yes  Drugs or alcohol (? 8 drinks/week, anti-plt or NSAID) No  CHA2DS2-VASc Score = 2  The patient's score is based upon: CHF History: 0 HTN History: 0 Diabetes History: 0 Stroke History: 0 Vascular Disease History: 1 (coronary calcifications on CT) Age Score: 1 Gender Score: 0        ASSESSMENT AND PLAN: Paroxysmal Atrial Fibrillation (ICD10:  I48.0) The patient's CHA2DS2-VASc score is 2, indicating a 2.2% annual risk of stroke.   S/p afib ablation 12/21/22 Patient appears to be maintaining SR Continue Eliquis 5 mg BID  Continue amiodarone 200 mg daily for now  Secondary Hypercoagulable State (ICD10:  D68.69) The patient is at significant risk for stroke/thromboembolism based upon his CHA2DS2-VASc Score of 2.  Continue Apixaban (Eliquis). Currently scheduled for Watchman implant 02/17/23. Instructions and presurgical scrub given today.  OSA  Sleep study completed 01/03/23 showing mild OSA   Follow up with Dr Lalla Brothers for [facility] implant as scheduled.    Jorja Loa PA-C Afib  [facility] [address] [place], [place] [address] [phone] 01/18/2023 2:22 PM

## 2023-01-26 ENCOUNTER — Telehealth: Payer: Self-pay

## 2023-01-26 DIAGNOSIS — G4733 Obstructive sleep apnea (adult) (pediatric): Secondary | ICD-10-CM

## 2023-01-26 NOTE — Telephone Encounter (Signed)
Notified patient of sleep study and results. All questions were answered and patient verbalized understanding. CPAP Titration ordered today 01/26/23.

## 2023-01-26 NOTE — Telephone Encounter (Signed)
-----   Message from Nicki Guadalajara sent at 01/12/2023  5:27 PM EDT ----- Eliseo Gum, please notify pt of results; schedule for CPAP titration, if unable then set up Auto-PAP

## 2023-02-01 ENCOUNTER — Telehealth: Payer: Self-pay

## 2023-02-15 ENCOUNTER — Telehealth: Payer: Self-pay

## 2023-02-15 NOTE — Telephone Encounter (Signed)
Confirmed procedure date of 02/17/2023. Confirmed arrival time of 0530 for procedure time at 0730. Reviewed pre-procedure instructions with patient. He understands to take all medications (including Eliquis) as directed through tomorrow. He will only take amiodarone the morning of his procedure. The patient understands to call if questions/concerns arise prior to procedure.

## 2023-02-16 NOTE — Progress Notes (Signed)
PCP -  Cardiologist -Pricilla Riffle, MD  Electrophysiology - Cardiology -Lanier Prude, MD   PPM/ICD -  Device Orders -  Rep Notified -   Chest x-ray - 02-07-22 EKG - 01-18-23 Stress Test -  ECHO - 09-29-22 Cardiac Cath -   CPAP -  Sleep test 01-03-23 GLP-1 -  Fasting Blood Sugar -  Checks Blood Sugar /day  Blood Thinner Instructions: As directed by cardiology Aspirin Instructions:   ERAS Protcol - NPO  COVID TEST-   Anesthesia review: y  Patient verbally denies any shortness of breath, fever, cough and chest pain during phone call   -------------  SDW INSTRUCTIONS given:  Your procedure is scheduled on Sept. 26-2024.  Report to [facility] Main Entrance "A" at 5:30 A.M., and check in at the Admitting office.  Call this number if you have problems the morning of surgery:  [phone]   Remember:  Do not eat or drink after midnight the night before your surgery      Take these medicines the morning of surgery with A SIP OF WATER  As directed by Cardiology  As of today, STOP taking any Aspirin (unless otherwise instructed by your surgeon) Aleve, Naproxen, Ibuprofen, Motrin, Advil, Goody's, BC's, all herbal medications, fish oil, and all vitamins.                      Do not wear jewelry, make up, or nail polish            Do not wear lotions, powders, perfumes/colognes, or deodorant.            Do not shave 48 hours prior to surgery.  Men may shave face and neck.            Do not bring valuables to the hospital.            [facility] is not responsible for any belongings or valuables.  Do NOT Smoke (Tobacco/Vaping) 24 hours prior to your procedure If you use a CPAP at night, you may bring all equipment for your overnight stay.   Contacts, glasses, dentures or bridgework may not be worn into surgery.      For patients admitted to the hospital, discharge time will be determined by your treatment team.   Patients discharged the day of surgery will not be  allowed to drive home, and someone needs to stay with them for 24 hours.    Special instructions:   [place]- Preparing For Surgery  Before surgery, you can play an important role. Because skin is not sterile, your skin needs to be as free of germs as possible. You can reduce the number of germs on your skin by washing with CHG (chlorahexidine gluconate) Soap before surgery.  CHG is an antiseptic cleaner which kills germs and bonds with the skin to continue killing germs even after washing.    Oral Hygiene is also important to reduce your risk of infection.  Remember - BRUSH YOUR TEETH THE MORNING OF SURGERY WITH YOUR REGULAR TOOTHPASTE  Please do not use if you have an allergy to CHG or antibacterial soaps. If your skin becomes reddened/irritated stop using the CHG.  Do not shave (including legs and underarms) for at least 48 hours prior to first CHG shower. It is OK to shave your face.  Please follow these instructions carefully.   Shower the NIGHT BEFORE SURGERY and the MORNING OF SURGERY with DIAL Soap.   Pat yourself  dry with a CLEAN TOWEL.  Wear CLEAN PAJAMAS to bed the night before surgery  Place CLEAN SHEETS on your bed the night of your first shower and DO NOT SLEEP WITH PETS.   Day of Surgery: Please shower morning of surgery  Wear Clean/Comfortable clothing the morning of surgery Do not apply any deodorants/lotions.   Remember to brush your teeth WITH YOUR REGULAR TOOTHPASTE.   Questions were answered. Patient verbalized understanding of instructions.

## 2023-02-16 NOTE — Anesthesia Preprocedure Evaluation (Signed)
Anesthesia Evaluation  Patient identified by MRN, date of birth, ID band Patient awake    Reviewed: Allergy & Precautions, NPO status , Patient's Chart, lab work & pertinent test results  History of Anesthesia Complications Negative for: history of anesthetic complications  Airway Mallampati: II  TM Distance: >3 FB Neck ROM: Full    Dental  (+) Dental Advisory Given, Caps   Pulmonary sleep apnea and Continuous Positive Airway Pressure Ventilation , COPD   breath sounds clear to auscultation       Cardiovascular (-) hypertension(-) angina + dysrhythmias Atrial Fibrillation  Rhythm:Regular Rate:Normal  [address] ECHO :EF 60-65%, normal LVF, normal RVF, trivial MR   Neuro/Psych negative neurological ROS     GI/Hepatic Neg liver ROS, hiatal hernia,GERD  Medicated and Controlled,,  Endo/Other  negative endocrine ROS    Renal/GU negative Renal ROS     Musculoskeletal   Abdominal   Peds  Hematology negative hematology ROS (+)   Anesthesia Other Findings   Reproductive/Obstetrics                              Anesthesia Physical Anesthesia Plan  ASA: 3  Anesthesia Plan: General   Post-op Pain Management: Tylenol PO (pre-op)*   Induction: Intravenous  PONV Risk Score and Plan: 2 and Ondansetron and Dexamethasone  Airway Management Planned: Oral ETT  Additional Equipment: ClearSight  Intra-op Plan:   Post-operative Plan: Extubation in OR  Informed Consent: I have reviewed the patients History and Physical, chart, labs and discussed the procedure including the risks, benefits and alternatives for the proposed anesthesia with the patient or authorized representative who has indicated his/her understanding and acceptance.     Dental advisory given  Plan Discussed with: CRNA and Surgeon  Anesthesia Plan Comments:          Anesthesia Quick Evaluation

## 2023-02-17 ENCOUNTER — Other Ambulatory Visit: Payer: Self-pay | Admitting: Cardiology

## 2023-02-17 ENCOUNTER — Inpatient Hospital Stay (HOSPITAL_COMMUNITY): Payer: Medicare Other

## 2023-02-17 ENCOUNTER — Inpatient Hospital Stay (HOSPITAL_COMMUNITY)
Admission: RE | Disposition: A | Discharge status: Home / Self Care | Payer: Self-pay | Source: Home / Self Care | Attending: Cardiology

## 2023-02-17 ENCOUNTER — Other Ambulatory Visit: Payer: Self-pay

## 2023-02-17 ENCOUNTER — Encounter (HOSPITAL_COMMUNITY): Payer: Self-pay | Admitting: Cardiology

## 2023-02-17 ENCOUNTER — Inpatient Hospital Stay (HOSPITAL_COMMUNITY): Payer: Self-pay | Admitting: Anesthesiology

## 2023-02-17 ENCOUNTER — Encounter: Payer: Self-pay | Admitting: Cardiology

## 2023-02-17 ENCOUNTER — Inpatient Hospital Stay (HOSPITAL_COMMUNITY)
Admission: RE | Admit: 2023-02-17 | Discharge: 2023-02-17 | DRG: 274 | Disposition: A | Discharge status: Home / Self Care | Payer: Medicare Other | Attending: Cardiology | Admitting: Cardiology

## 2023-02-17 ENCOUNTER — Inpatient Hospital Stay (HOSPITAL_COMMUNITY): Payer: Medicare Other | Admitting: Anesthesiology

## 2023-02-17 DIAGNOSIS — E785 Hyperlipidemia, unspecified: Secondary | ICD-10-CM | POA: Diagnosis present

## 2023-02-17 DIAGNOSIS — Z95818 Presence of other cardiac implants and grafts: Secondary | ICD-10-CM

## 2023-02-17 DIAGNOSIS — I2510 Atherosclerotic heart disease of native coronary artery without angina pectoris: Secondary | ICD-10-CM | POA: Diagnosis present

## 2023-02-17 DIAGNOSIS — I4891 Unspecified atrial fibrillation: Secondary | ICD-10-CM

## 2023-02-17 DIAGNOSIS — Z006 Encounter for examination for normal comparison and control in clinical research program: Secondary | ICD-10-CM

## 2023-02-17 DIAGNOSIS — I480 Paroxysmal atrial fibrillation: Principal | ICD-10-CM | POA: Diagnosis present

## 2023-02-17 DIAGNOSIS — M069 Rheumatoid arthritis, unspecified: Secondary | ICD-10-CM | POA: Diagnosis present

## 2023-02-17 DIAGNOSIS — K219 Gastro-esophageal reflux disease without esophagitis: Secondary | ICD-10-CM | POA: Diagnosis present

## 2023-02-17 HISTORY — PX: TEE WITHOUT CARDIOVERSION: SHX5443

## 2023-02-17 HISTORY — PX: LEFT ATRIAL APPENDAGE OCCLUSION: EP1229

## 2023-02-17 HISTORY — DX: Presence of other cardiac implants and grafts: Z95.818

## 2023-02-17 LAB — ABO/RH: ABO/RH(D): A POS

## 2023-02-17 LAB — TYPE AND SCREEN
ABO/RH(D): A POS
Antibody Screen: NEGATIVE

## 2023-02-17 LAB — POCT ACTIVATED CLOTTING TIME: Activated Clotting Time: 391 seconds

## 2023-02-17 LAB — SURGICAL PCR SCREEN
MRSA, PCR: NEGATIVE
Staphylococcus aureus: POSITIVE — AB

## 2023-02-17 LAB — ECHO TEE

## 2023-02-17 SURGERY — LEFT ATRIAL APPENDAGE OCCLUSION
Anesthesia: General

## 2023-02-17 MED ORDER — SODIUM CHLORIDE 0.9 % IV SOLN: INTRAVENOUS | Status: DC

## 2023-02-17 MED ORDER — PHENYLEPHRINE HCL-NACL 20-0.9 MG/250ML-% IV SOLN
INTRAVENOUS | Status: DC | PRN
  Administered 2023-02-17: 50 ug/min via INTRAVENOUS

## 2023-02-17 MED ORDER — PHENYLEPHRINE 80 MCG/ML (10ML) SYRINGE FOR IV PUSH (FOR BLOOD PRESSURE SUPPORT)
PREFILLED_SYRINGE | INTRAVENOUS | Status: DC | PRN
  Administered 2023-02-17: 80 ug via INTRAVENOUS

## 2023-02-17 MED ORDER — PROPOFOL 10 MG/ML IV BOLUS
INTRAVENOUS | Status: DC | PRN
  Administered 2023-02-17: 100 mg via INTRAVENOUS

## 2023-02-17 MED ORDER — CHLORHEXIDINE GLUCONATE 4 % EX SOLN
Freq: Once | CUTANEOUS | Status: DC
  Filled 2023-02-17: qty 15

## 2023-02-17 MED ORDER — SUGAMMADEX SODIUM 200 MG/2ML IV SOLN
INTRAVENOUS | Status: DC | PRN
  Administered 2023-02-17: 170 mg via INTRAVENOUS

## 2023-02-17 MED ORDER — HEPARIN (PORCINE) IN NACL 1000-0.9 UT/500ML-% IV SOLN
INTRAVENOUS | Status: DC | PRN
  Administered 2023-02-17: 500 mL

## 2023-02-17 MED ORDER — ROCURONIUM BROMIDE 10 MG/ML (PF) SYRINGE
PREFILLED_SYRINGE | INTRAVENOUS | Status: DC | PRN
  Administered 2023-02-17: 20 mg via INTRAVENOUS
  Administered 2023-02-17: 60 mg via INTRAVENOUS

## 2023-02-17 MED ORDER — SODIUM CHLORIDE 0.9% FLUSH
3.0000 mL | Freq: Two times a day (BID) | INTRAVENOUS | Status: DC
  Administered 2023-02-17: 3 mL via INTRAVENOUS

## 2023-02-17 MED ORDER — FENTANYL CITRATE (PF) 100 MCG/2ML IJ SOLN
INTRAMUSCULAR | Status: AC
  Filled 2023-02-17: qty 2

## 2023-02-17 MED ORDER — IOHEXOL 350 MG/ML SOLN
INTRAVENOUS | Status: DC | PRN
  Administered 2023-02-17: 10 mL

## 2023-02-17 MED ORDER — ACETAMINOPHEN 500 MG PO TABS
1000.0000 mg | ORAL_TABLET | Freq: Once | ORAL | Status: AC
  Administered 2023-02-17: 1000 mg via ORAL

## 2023-02-17 MED ORDER — LACTATED RINGERS IV SOLN: INTRAVENOUS | Status: DC

## 2023-02-17 MED ORDER — SODIUM CHLORIDE 0.9 % IV SOLN: 250.0000 mL | INTRAVENOUS | Status: DC | PRN

## 2023-02-17 MED ORDER — SODIUM CHLORIDE 0.9% FLUSH: 3.0000 mL | INTRAVENOUS | Status: DC | PRN

## 2023-02-17 MED ORDER — FENTANYL CITRATE (PF) 250 MCG/5ML IJ SOLN
INTRAMUSCULAR | Status: DC | PRN
  Administered 2023-02-17: 100 ug via INTRAVENOUS

## 2023-02-17 MED ORDER — PROTAMINE SULFATE 10 MG/ML IV SOLN
INTRAVENOUS | Status: DC | PRN
Start: 2023-02-17 — End: 2023-02-17
  Administered 2023-02-17: 30 mg via INTRAVENOUS
  Administered 2023-02-17: 10 mg via INTRAVENOUS

## 2023-02-17 MED ORDER — LIDOCAINE 2% (20 MG/ML) 5 ML SYRINGE
INTRAMUSCULAR | Status: DC | PRN
  Administered 2023-02-17: 40 mg via INTRAVENOUS

## 2023-02-17 MED ORDER — ACETAMINOPHEN 325 MG PO TABS: 650.0000 mg | ORAL_TABLET | ORAL | Status: DC | PRN

## 2023-02-17 MED ORDER — VANCOMYCIN HCL IN DEXTROSE 1-5 GM/200ML-% IV SOLN: 1000.0000 mg | INTRAVENOUS | Status: DC

## 2023-02-17 MED ORDER — HEPARIN SODIUM (PORCINE) 1000 UNIT/ML IJ SOLN
INTRAMUSCULAR | Status: DC | PRN
Start: 2023-02-17 — End: 2023-02-17
  Administered 2023-02-17: 12000 [IU] via INTRAVENOUS

## 2023-02-17 MED ORDER — ONDANSETRON HCL 4 MG/2ML IJ SOLN
INTRAMUSCULAR | Status: DC | PRN
  Administered 2023-02-17: 4 mg via INTRAVENOUS

## 2023-02-17 MED ORDER — ONDANSETRON HCL 4 MG/2ML IJ SOLN: 4.0000 mg | Freq: Four times a day (QID) | INTRAMUSCULAR | Status: DC | PRN

## 2023-02-17 MED ORDER — HEPARIN (PORCINE) IN NACL 2000-0.9 UNIT/L-% IV SOLN
INTRAVENOUS | Status: DC | PRN
  Administered 2023-02-17: 1000 mL

## 2023-02-17 MED ORDER — CHLORHEXIDINE GLUCONATE 0.12 % MT SOLN
15.0000 mL | Freq: Once | OROMUCOSAL | Status: AC
  Administered 2023-02-17: 15 mL via OROMUCOSAL

## 2023-02-17 SURGICAL SUPPLY — 15 items
CATH INFINITI 5FR ANG PIGTAIL (CATHETERS) ×1
CLOSURE PERCLOSE PROSTYLE (VASCULAR PRODUCTS) ×2
DILATOR VESSEL 38 20CM 12FR (INTRODUCER) ×1
KIT SHEA VERSACROSS LAAC CONNE (KITS) ×1
PACK CARDIAC CATHETERIZATION (CUSTOM PROCEDURE TRAY) ×1
PAD DEFIB RADIO PHYSIO CONN (PAD) ×1
SHEATH PERFORMER 16FR 30 (SHEATH) ×1
SHEATH PINNACLE 8F 10CM (SHEATH) ×1
SHEATH PROBE COVER 6X72 (BAG) ×1
SYS WATCHMAN FXD DBL (SHEATH) ×1
TRANSDUCER W/STOPCOCK (MISCELLANEOUS) ×1
TUBING CIL FLEX 10 FLL-RA (TUBING) ×1
WATCHMAN FLX PRO 31 (Prosthesis & Implant Heart) ×1 IMPLANT
WATCHMAN FLX PRO PROCEDURE (KITS) ×1
WATCHMAN FXD CRV SYS PROCEDURE (KITS) ×1

## 2023-02-17 NOTE — Plan of Care (Signed)
Problem: Education: Goal: Knowledge of cardiac device and self-care will improve Outcome: Progressing Goal: Ability to safely manage health related needs after discharge will improve Outcome: Progressing Goal: Individualized Educational Video(s) Outcome: Progressing   Problem: Cardiac: Goal: Ability to achieve and maintain adequate cardiopulmonary perfusion will improve Outcome: Progressing   Problem: Education: Goal: Understanding of disease, treatment, and recovery process will improve Outcome: Progressing   Problem: Activity: Goal: Ability to return to baseline activity level will improve Outcome: Progressing   Problem: Cardiac: Goal: Ability to maintain adequate cardiovascular perfusion will improve Outcome: Progressing Goal: Vascular access site(s) Level 0-1 will be maintained Outcome: Progressing   Problem: Health Behavior/ Discharge Planning: Goal: Ability to safely manage health related needs after discharge Outcome: Progressing   Problem: Education: Goal: Knowledge of General Education information will improve Description: Including pain rating scale, medication(s)/side effects and non-pharmacologic comfort measures Outcome: Progressing   Problem: Health Behavior/Discharge Planning: Goal: Ability to manage health-related needs will improve Outcome: Progressing   Problem: Clinical Measurements: Goal: Ability to maintain clinical measurements within normal limits will improve Outcome: Progressing Goal: Will remain free from infection Outcome: Progressing Goal: Diagnostic test results will improve Outcome: Progressing Goal: Respiratory complications will improve Outcome: Progressing Goal: Cardiovascular complication will be avoided Outcome: Progressing   Problem: Activity: Goal: Risk for activity intolerance will decrease Outcome: Progressing   Problem: Nutrition: Goal: Adequate nutrition will be maintained Outcome: Progressing   Problem:  Coping: Goal: Level of anxiety will decrease Outcome: Progressing   Problem: Elimination: Goal: Will not experience complications related to bowel motility Outcome: Progressing Goal: Will not experience complications related to urinary retention Outcome: Progressing   Problem: Pain Managment: Goal: General experience of comfort will improve Outcome: Progressing   Problem: Safety: Goal: Ability to remain free from injury will improve Outcome: Progressing   Problem: Skin Integrity: Goal: Risk for impaired skin integrity will decrease Outcome: Progressing

## 2023-02-17 NOTE — Discharge Instructions (Signed)

## 2023-02-17 NOTE — Anesthesia Postprocedure Evaluation (Signed)
Anesthesia Post Note  Patient: Steven Glover  Procedure(s) Performed: LEFT ATRIAL APPENDAGE OCCLUSION TRANSESOPHAGEAL ECHOCARDIOGRAM     Patient location during evaluation: Cath Lab Anesthesia Type: General Level of consciousness: awake and alert, patient cooperative and oriented Pain management: pain level controlled Vital Signs Assessment: vitals unstable and post-procedure vital signs reviewed and stable Respiratory status: nonlabored ventilation, spontaneous breathing and respiratory function stable Postop Assessment: no apparent nausea or vomiting Anesthetic complications: no   There were no known notable events for this encounter.  Last Vitals:  Vitals:   02/17/23 1030 02/17/23 1045  BP: 120/82 113/82  Pulse: (!) 54 (!) 56  Resp: (!) 6 12  Temp:    SpO2: 95% 93%    Last Pain:  Vitals:   02/17/23 0933  TempSrc: Temporal  PainSc: 0-No pain                 Dallas Scorsone,E. Tramaine Snell

## 2023-02-17 NOTE — H&P (Signed)
Electrophysiology Office Note:     Date:  02/17/2023    ID:  Steven Glover, DOB 1951-05-03, MRN [id number]   [facility] Cardiologist:  Dietrich Pates, MD  [facility] Electrophysiologist:  Lanier Prude, MD    Referring MD: Eustace Pen, PA-C    Chief Complaint: AF   History of Present Illness:     Steven Glover is a 72 y.o. male who I am seeing today for an evaluation of AF at the request of [name]. He saw [name] 09/27/2022. The patient has a history of pAF, hiatal hernia, CAD, GERD, RA. His AF was discovered when he presented for a urologic procedure in April 2024. A heart monitor was ordered that ultimately showed 16% AF. He was started on Eliquis and amiodarone in 09/2022 as a bridge to ablation.  He is with family today in clinic.  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  He is very concerned about the risk of bleeding associated with indefinite anticoagulation use.  He is highly symptomatic when in atrial fibrillation with shortness of breath and fatigue.  He does not really appreciate palpitations associate with atrial fibrillation, it is more the shortness of breath and fatigue.      Presents for [employer] today. He has done well after his recent PVI. Procedure reviewed.         Objective Their past medical, social and family history was reveiwed.     ROS:   Please see the history of present illness.    All other systems reviewed and are negative.   EKGs/Labs/Other Studies Reviewed:     The following studies were reviewed today:   09/29/2022 echo EF 60 RV normal Trivial MR     09/27/2022 Zio AF burden 16%     10/11/2022 ECG shows sinus bradycardia               Physical Exam:     VS:  BP 145/76 (BP Location: Left Arm, Patient Position: Sitting, Cuff Size: Normal)   Pulse 58   Ht 6\' 3"  (1.905 m)   Wt 198 lb 12.8 oz (90.2 kg)   SpO2 98%   BMI 24.85 kg/m         Wt Readings from Last 3 Encounters:  10/13/22  198 lb 12.8 oz (90.2 kg)  09/27/22 199 lb 9.6 oz (90.5 kg)  08/30/22 204 lb (92.5 kg)      GEN:  Well nourished, well developed in no acute distress.  Appears younger than stated age CARDIAC: RRR, no murmurs, rubs, gallops RESPIRATORY:  Clear to auscultation without rales, wheezing or rhonchi          Assessment ASSESSMENT AND PLAN:     1. Paroxysmal atrial fibrillation (HCC)       #Paroxysmal AF #AFL #High risk drug monitoring - Amiodarone Maintaining sinus rhythm.    On eliquis for stroke ppx.   -----------------------   I have seen Steven Glover in the office today who is being considered for a Watchman left atrial appendage closure device. I believe they will benefit from this procedure given their history of atrial fibrillation, CHA2DS2-VASc score of 2 and unadjusted ischemic stroke rate of 2.2% per year.  The patient has a strong desire to avoid the long-term exposure anticoagulation given the associated increased bleeding risk given his active lifestyle.  The patient's chart has been reviewed and I feel that they would be a candidate for short term  oral anticoagulation after Watchman implant.    It is my belief that after undergoing a LAA closure procedure, Steven Glover will not need long term anticoagulation which eliminates anticoagulation side effects and major bleeding risk.    Procedural risks for the Watchman implant have been reviewed with the patient including a 0.5% risk of stroke, <1% risk of perforation and <1% risk of device embolization. Other risks include bleeding, vascular damage, tamponade, worsening renal function, and death. The patient understands these risk and wishes to proceed.       The published clinical data on the safety and effectiveness of WATCHMAN include but are not limited to the following: - Holmes DR, Everlene Farrier, Sick P et al. for the PROTECT AF Investigators. Percutaneous closure of the left atrial appendage versus warfarin  therapy for prevention of stroke in patients with atrial fibrillation: a randomised non-inferiority trial. Lancet 2009; 374: 534-42. Everlene Farrier, Doshi SK, Isa Rankin D et al. on behalf of the PROTECT AF Investigators. Percutaneous Left Atrial Appendage Closure for Stroke Prophylaxis in Patients With Atrial Fibrillation 2.3-Year Follow-up of the PROTECT AF (Watchman Left Atrial Appendage System for Embolic Protection in Patients With Atrial Fibrillation) Trial. Circulation 2013; 127:720-729. - Alli O, Doshi S,  Kar S, Reddy VY, Sievert H et al. Quality of Life Assessment in the Randomized PROTECT AF (Percutaneous Closure of the Left Atrial Appendage Versus Warfarin Therapy for Prevention of Stroke in Patients With Atrial Fibrillation) Trial of Patients at Risk for Stroke With Nonvalvular Atrial Fibrillation. J Am Coll Cardiol 2013; 61:1790-8. Aline August DR, Mia [place], Price M, Whisenant B, Sievert H, Doshi S, Huber K, Reddy V. Prospective randomized evaluation of the Watchman left atrial appendage Device in patients with atrial fibrillation versus long-term warfarin therapy; the PREVAIL trial. Journal of the [employer] of Cardiology, Vol. 4, No. 1, 2014, 1-11. - Kar S, Doshi SK, Sadhu A, Horton R, Osorio J et al. Primary outcome evaluation of a next-generation left atrial appendage closure device: results from the PINNACLE FLX trial. Circulation 2021;143(18)1754-1762.      After today's visit with the patient which was dedicated solely for shared decision making visit regarding LAA closure device, the patient decided to proceed with the LAA appendage closure procedure scheduled to be done in the near future at [facility]. Prior to the procedure, I would like to obtain a gated CT scan of the chest with contrast timed for PV/LA visualization.      HAS-BLED score 1 Hypertension No  Abnormal renal and liver function (Dialysis, transplant, Cr >2.26 mg/dL /Cirrhosis or Bilirubin >2x Normal or  AST/ALT/AP >3x Normal) No  Stroke No  Bleeding No Labile INR (Unstable/high INR) No  Elderly (>65) Yes  Drugs or alcohol (>= 8 drinks/week, anti-plt or NSAID) No    CHA2DS2-VASc Score = 2  The patient's score is based upon: CHF History: 0 HTN History: 0 Diabetes History: 0 Stroke History: 0 Vascular Disease History: 1 Age Score: 1 Gender Score: 0     Presents for Watchman today. Procedure reviewed.           Signed, Rossie Muskrat. Lalla Brothers, MD, [facility], [facility] 02/17/2023 Electrophysiology [facility]

## 2023-02-17 NOTE — Transfer of Care (Signed)
Immediate Anesthesia Transfer of Care Note  Patient: Steven Glover  Procedure(s) Performed: LEFT ATRIAL APPENDAGE OCCLUSION TRANSESOPHAGEAL ECHOCARDIOGRAM  Patient Location: PACU and Cath Lab  Anesthesia Type:General  Level of Consciousness: awake, drowsy, patient cooperative, and responds to stimulation  Airway & Oxygen Therapy: Patient Spontanous Breathing and Patient connected to nasal cannula oxygen  Post-op Assessment: Report given to RN and Post -op Vital signs reviewed and stable  Post vital signs: Reviewed and stable  Last Vitals:  Vitals Value Taken Time  BP    Temp    Pulse    Resp    SpO2      Last Pain:  Vitals:   02/17/23 0625  TempSrc:   PainSc: 0-No pain         Complications: There were no known notable events for this encounter.

## 2023-02-17 NOTE — Anesthesia Procedure Notes (Signed)
Procedure Name: Intubation Date/Time: 02/17/2023 7:47 AM  Performed by: Ayesha Rumpf, CRNAPre-anesthesia Checklist: Patient identified, Emergency Drugs available, Suction available and Patient being monitored Patient Re-evaluated:Patient Re-evaluated prior to induction Oxygen Delivery Method: Circle System Utilized Preoxygenation: Pre-oxygenation with 100% oxygen Induction Type: IV induction Ventilation: Mask ventilation without difficulty Laryngoscope Size: Mac and 4 Grade View: Grade II Tube type: Oral Tube size: 7.5 mm Number of attempts: 1 Airway Equipment and Method: Stylet and Oral airway Placement Confirmation: ETT inserted through vocal cords under direct vision, positive ETCO2 and breath sounds checked- equal and bilateral Secured at: 23 cm Tube secured with: Tape Dental Injury: Teeth and Oropharynx as per pre-operative assessment

## 2023-02-17 NOTE — Discharge Summary (Signed)
[facility]    Patient ID: Steven Glover,  MRN: [id number], DOB/AGE: 24-May-1951 71 y.o.  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 Lalla Brothers.   This study demonstrated:  CONCLUSIONS:  1.Successful implantation of a WATCHMAN left atrial appendage occlusive device    2. TEE demonstrating no LAA thrombus 3. No early apparent complications.      Post Implant Anticoagulation Strategy: Continue Eliquis for 45 days following implant. After 45 days, stop Eliquis and start Plavix 75mg  PO once daily to complete 6 months of post implant therapy. Plan for CT scan 60 days after implant to assess Watchman position and appendage seal.   Brief HPI: Steven Glover is a 72 y.o. male with a history of mild nonobstructive CAD, HLD, hiatal hernia, rheumatoid arthritis, bladder tumor, and PAF who underwent AF ablation 12/21/22. He wishes to undergo LAAO to avoid long term AC due to high-risk occupation.  Steven Glover's AF was discovered when he presented for a urologic procedure in April 2024. A heart monitor was ordered that ultimately showed 16% AF. He was started on Eliquis and amiodarone in 09/2022 as a bridge to ablation that was performed 12/21/22. Plan was for LAAO placement post ablation. Pre ablation CT with anatomy suitable to proceed.   Hospital Course:  The patient was admitted and underwent left atrial appendage occlusive device placement with 31mm Watchman FLX Pro device.  He was monitored in the post procedure setting and has done very well with  no concerns. Given this, he is being considered for same day discharge later today. Groin site has been stable without evidence of hematoma or bleeding. Wound care and restrictions were reviewed with the patient. The patient has been scheduled for post procedure follow up with Georgie Chard, NP in approximately 1 month. He will be restarted on Eliquis this evening and continue for 45 days (11/10) at which time he will stop. He will then transition to Plavix 75mg  daily to complete 6 months of therapy (08/17/23). A repeat CT will be performed in approximately 60 days to ensure proper seal of the device.   Physical Exam: Vitals:   02/17/23 1230 02/17/23 1300 02/17/23 1347 02/17/23 1400  BP: 123/74 115/71  114/65  Pulse: (!) 53 (!) 55  (!) 53  Resp: 13 11 14 15   Temp:   97.8 F (36.6 C) 97.7 F (36.5 C)  TempSrc:   Oral Oral  SpO2: 95% 95%  95%  Weight:      Height:       Labs:   Lab Results  Component Value Date   WBC 4.9 01/18/2023   HGB 13.9 01/18/2023   HCT 41.2 01/18/2023   MCV 96.0 01/18/2023   PLT 134 (L) 01/18/2023   No results for input(s): "NA", "K", "CL", "CO2", "BUN", "CREATININE", "CALCIUM", "PROT", "BILITOT", "ALKPHOS", "ALT", "AST", "GLUCOSE" in the last 168 hours.  Invalid input(s): "LABALBU"   Discharge Medications:  Allergies as of 02/17/2023       Reactions   Cefprozil    Nausea, felt bad   Statins    myalgias        Medication  List     TAKE these medications    amiodarone 200 MG tablet Commonly known as: PACERONE Take 1 tablet (200 mg total) by mouth daily.   cetirizine 10 MG tablet Commonly known as: ZYRTEC Take 10 mg by mouth daily as needed for allergies.   Eliquis 5 MG Tabs tablet Generic drug: apixaban Take 1 tablet by mouth twice daily   ezetimibe 10 MG tablet Commonly known as: ZETIA Take 1 tablet by mouth once daily   folic acid 1 MG tablet Commonly known as: FOLVITE Take 1 mg by mouth daily.   methotrexate 2.5 MG  tablet Commonly known as: RHEUMATREX Take 17.5 mg by mouth every Monday. 7 tablets-Monday   pantoprazole 40 MG tablet Commonly known as: PROTONIX Take 1 tablet (40 mg total) by mouth 2 (two) times daily.   SIMPONI ARIA IV Inject into the vein. Every 8 weeks        Disposition:  Home Discharge Instructions     Diet - low sodium heart healthy   Complete by: As directed    Discharge instructions   Complete by: As directed    [facility] Procedure, Care After  Procedure MD: Dr. Isidoro Donning Clinical Coordinator: Karsten Fells, RN  This sheet gives you information about how to care for yourself after your procedure. Your health care provider may also give you more specific instructions. If you have problems or questions, contact your health care provider.  What can I expect after the procedure? After the procedure, it is common to have: Bruising around your puncture site. Tenderness around your puncture site. Tiredness (fatigue).  Medication instructions It is very important to continue to take your blood thinner as directed by your doctor after the Watchman procedure. Call your procedure doctor's office with question or concerns. If you are on Coumadin (warfarin), you will have your INR checked the week after your procedure, with a goal INR of 2.0 - 3.0. Please follow your medication instructions on your discharge summary. Only take the medications listed on your discharge paperwork.  Follow up You will be seen in 1 month after your procedure You will have a repeat CT scan approximately 8 weeks after your procedure mark to check your device You will follow up the MD/APP who performed your procedure 6 months after your procedure The Watchman Clinical Coordinator will check in with you from time to time, including 1 and 2 years after your procedure.    Follow these instructions at home: Puncture site care  Follow instructions from your health care provider about how to take  care of your puncture site. Make sure you: If present, leave stitches (sutures), skin glue, or adhesive strips in place.  If a large square bandage is present, this may be removed 24 hours after surgery.  Check your puncture site every day for signs of infection. Check for: Redness, swelling, or pain. Fluid or blood. If your puncture site starts to bleed, lie down on your back, apply firm pressure to the area, and contact your health care provider. Warmth. Pus or a bad smell. Driving Do not drive yourself home if you received sedation Do not drive for at least 4 days after your procedure or however long your health care provider recommends. (Do not resume driving if you have previously been instructed not to drive for other health reasons.) Do not spend greater than 1 hour at a time in a car for the first 3 days. Stop and take a break with a 5 minute  walk at least every hour.  Do not drive or use heavy machinery while taking prescription pain medicine.  Activity Avoid activities that take a lot of effort, including exercise, for at least 7 days after your procedure. For the first 3 days, avoid sitting for longer than one hour at a time.  Avoid alcoholic beverages, signing paperwork, or participating in legal proceedings for 24 hours after receiving sedation Do not lift anything that is heavier than 10 lb (4.5 kg) for one week.  No sexual activity for 1 week.  Return to your normal activities as told by your health care provider. Ask your health care provider what activities are safe for you. General instructions Take over-the-counter and prescription medicines only as told by your health care provider. Do not use any products that contain nicotine or tobacco, such as cigarettes and e-cigarettes. If you need help quitting, ask your health care provider. You may shower after 24 hours, but Do not take baths, swim, or use a hot tub for 1 week.  Do not drink alcohol for 24 hours after your  procedure. Keep all follow-up visits as told by your health care provider. This is important. Dental Work: You will require antibiotics prior to any dental work, including cleanings, for 6 months after your Watchman implantation to help protect you from infection. After 6 months, antibiotics are no longer required. Contact a health care provider if: You have redness, mild swelling, or pain around your puncture site. You have soreness in your throat or at your puncture site that does not improve after several days You have fluid or blood coming from your puncture site that stops after applying firm pressure to the area. Your puncture site feels warm to the touch. You have pus or a bad smell coming from your puncture site. You have a fever. You have chest pain or discomfort that spreads to your neck, jaw, or arm. You are sweating a lot. You feel nauseous. You have a fast or irregular heartbeat. You have shortness of breath. You are dizzy or light-headed and feel the need to lie down. You have pain or numbness in the arm or leg closest to your puncture site. Get help right away if: Your puncture site suddenly swells. Your puncture site is bleeding and the bleeding does not stop after applying firm pressure to the area. These symptoms may represent a serious problem that is an emergency. Do not wait to see if the symptoms will go away. Get medical help right away. Call your local emergency services (911 in the [place] Do not drive yourself to the hospital. Summary After the procedure, it is normal to have bruising and tenderness at the puncture site in your groin, neck, or forearm. Check your puncture site every day for signs of infection. Get help right away if your puncture site is bleeding and the bleeding does not stop after applying firm pressure to the area. This is a medical emergency.  This information is not intended to replace advice given to you by your health care provider. Make sure  you discuss any questions you have with your health care provider.   Increase activity slowly   Complete by: As directed        Follow-up Information     Filbert Schilder, NP Follow up on 03/28/2023.   Specialty: Cardiology Why: @ 9:55am. Please arrive at 9:40am. Contact information: [address] [place] [address] [phone]  Duration of Discharge Encounter: Greater than 30 minutes including physician time.  Signed, Georgie Chard, NP  02/17/2023 3:42 PM

## 2023-02-17 NOTE — Progress Notes (Signed)
   February 17, 2023   Steven Glover [address] [place] [address]   Dear Steven Glover  WATCHMAN CT INSTRUCTIONS: Your WATCHMAN CT has been scheduled at [facility] on 04/29/2023 at 10:00am. You will need to arrive at 9:30am.   The day of your CT appointment, please enter [facility] through the [facility] En[facility] (Entrance C off [facility] You may use the FREE valet parking offered at entrance C (encouraged to control the heart rate for the test). Check in at the large round desk through the glass doors 30 minutes prior to your scheduled appointment.   Please follow these instructions carefully:  Hold all erectile dysfunction medications at least 3 days (72 hrs) prior to test.  On the Night Before the Test: Be sure to Drink plenty of water. Do not consume any caffeinated/decaffeinated beverages or chocolate 12 hours prior to your test. Do not take any antihistamines 12 hours prior to your test.  On the Day of the Test: You may take your regular medications prior to the test.  Noreene Larsson will assess the need for Metoprolol if your HR is elevated at follow up.      After the Test: Drink plenty of water. After receiving IV contrast, you may experience a mild flushed feeling. This is normal. On occasion, you may experience a mild rash up to 24 hours after the test. This is not dangerous. If this occurs, you can take Benadryl 25 mg and increase your fluid intake. If you experience trouble breathing, this can be serious. If it is severe call 911 IMMEDIATELY. If it is mild, please call our office. If you take any of these medications: Glipizide/Metformin, Avandament, Glucavance, please do not take 48 hours after completing test unless otherwise instructed.   For non-scheduling related questions/concerns about your CT scan, please contact the cardiac imaging nurses: Rockwell Alexandria, Cardiac Imaging Nurse Navigator Larey Brick, Cardiac  Imaging Nurse Navigator  Heart and Vascular Services Direct Office Dial: [phone]   For scheduling needs, including cancellations and rescheduling, please call [place], [phone].  Karsten Fells, the Watchman Nurse Navigator, will call you after your CT once the [facility] Team has reviewed your imaging for an update on proceedings. [place]'s direct number is [phone] if you need assistance.

## 2023-02-18 ENCOUNTER — Encounter (HOSPITAL_COMMUNITY): Payer: Self-pay | Admitting: Cardiology

## 2023-02-18 ENCOUNTER — Telehealth: Payer: Self-pay | Admitting: Cardiology

## 2023-02-18 MED FILL — Fentanyl Citrate Preservative Free (PF) Inj 100 MCG/2ML: INTRAMUSCULAR | Qty: 2 | Status: AC

## 2023-02-18 NOTE — Telephone Encounter (Signed)
  HEART AND VASCULAR CENTER   MULTIDISCIPLINARY HEART TEAM   Patient contacted regarding discharge from [facility] on 02/17/23 s/p LAAO with no complaints today. Groin site reported as stable. Resumed Eliquis yesterday evening.    Patient understands to follow up with provider Georgie Chard 11/4   Patient understands discharge instructions? Yes  Patient understands medications and regimen? Yes  Patient understands to bring all medications to this visit? Yes   Georgie Chard NP-C Structural Heart Team  Pager: [phone]

## 2023-02-20 ENCOUNTER — Other Ambulatory Visit: Payer: Self-pay | Admitting: Family Medicine

## 2023-02-28 ENCOUNTER — Encounter: Payer: Self-pay | Admitting: Nurse Practitioner

## 2023-02-28 ENCOUNTER — Ambulatory Visit: Payer: Medicare Other | Attending: Nurse Practitioner | Admitting: Nurse Practitioner

## 2023-02-28 VITALS — BP 120/60 | HR 52 | Ht 75.0 in | Wt 192.0 lb

## 2023-02-28 DIAGNOSIS — M79605 Pain in left leg: Secondary | ICD-10-CM | POA: Insufficient documentation

## 2023-02-28 DIAGNOSIS — I2510 Atherosclerotic heart disease of native coronary artery without angina pectoris: Secondary | ICD-10-CM | POA: Diagnosis present

## 2023-02-28 DIAGNOSIS — M79604 Pain in right leg: Secondary | ICD-10-CM | POA: Diagnosis present

## 2023-02-28 DIAGNOSIS — I480 Paroxysmal atrial fibrillation: Secondary | ICD-10-CM | POA: Diagnosis present

## 2023-02-28 DIAGNOSIS — E785 Hyperlipidemia, unspecified: Secondary | ICD-10-CM | POA: Diagnosis present

## 2023-02-28 NOTE — Progress Notes (Signed)
Office Visit    Patient Name: Steven Glover Date of Encounter: 02/28/2023  Primary Care Provider:  Pcp, No Primary Cardiologist:  Dietrich Pates, MD  Chief Complaint    72 year old male with a history of mild nonobstructive CAD, paroxysmal atrial fibrillation s/p Watchman device, hyperlipidemia, hiatal hernia, GERD, and rheumatoid arthritis who presents for follow-up related to CAD and atrial fibrillation.   Past Medical History    Past Medical History:  Diagnosis Date   Acid reflux disease    Allergy    Asthma    as child   BMI between 19-24,adult JUL 2012 191.8 LBS   Collagen vascular disease (HCC)    GERD (gastroesophageal reflux disease)    Internal hemorrhoids with other complication    TCS DEC 2013   Presence of Watchman left atrial appendage closure device 02/17/2023   31mm Watchman FLX Pro placed by Dr. Lalla Brothers   Pulmonary nodule 2013   [facility] - Repeat 08/2012   Rheumatoid arthritis(714.0)    Past Surgical History:  Procedure Laterality Date   ATRIAL FIBRILLATION ABLATION N/A 12/21/2022   Procedure: ATRIAL FIBRILLATION ABLATION;  Surgeon: Lanier Prude, MD;  Location: [facility] INVASIVE CV LAB;  Service: Cardiovascular;  Laterality: N/A;   COLONOSCOPY  2009 NUR [facility]   MULTIPLE SIMPLE ADENOMAS   COLONOSCOPY N/A 05/02/2017   Dr. Darrick Penna: 4 simple adenomas removed, diverticulosis.  Next colonoscopy in 3 years.   COLONOSCOPY W/ POLYPECTOMY  2010 NUR   COLONOSCOPY WITH PROPOFOL  05/15/2012   SLF:Two sessile polyps ranging between 3-10mm in size were found in the ascending colon and rectum; multiple biopsies were performed/Mild diverticulosis was noted in the sigmoid colon/The colon mucosa was otherwise normal/ Moderate sized internal hemorrhoids. TCS 04/2017.   COLONOSCOPY WITH PROPOFOL N/A 04/29/2020   Procedure: COLONOSCOPY WITH PROPOFOL;  Surgeon: Lanelle Bal, DO;  Location: [facility] ENDO SUITE;  Service: Endoscopy;  Laterality: N/A;  7:30am   ESOPHAGOGASTRODUODENOSCOPY   01/01/2011 MW:UXLK CHRONIC GASTRITIS   SLF: Hiatal Hernia/mild gastritis/stricture in the distal esophagus   FOOT SURGERY     LEFT ATRIAL APPENDAGE OCCLUSION N/A 02/17/2023   Procedure: LEFT ATRIAL APPENDAGE OCCLUSION;  Surgeon: Lanier Prude, MD;  Location: [facility] INVASIVE CV LAB;  Service: Cardiovascular;  Laterality: N/A;   POLYPECTOMY  05/15/2012   SIMPLE ADENOMA(2)   POLYPECTOMY  04/29/2020   Procedure: POLYPECTOMY;  Surgeon: Lanelle Bal, DO;  Location: [facility] ENDO SUITE;  Service: Endoscopy;;   SAVORY DILATION  01/01/2011   Procedure: SAVORY DILATION;  Surgeon: Arlyce Harman, MD;  Location: [facility] ENDO SU[facility];  Service: Endoscopy;  Laterality: N/A;   SHOULDER OPEN ROTATOR CUFF REPAIR  2005   TEE WITHOUT CARDIOVERSION N/A 02/17/2023   Procedure: TRANSESOPHAGEAL ECHOCARDIOGRAM;  Surgeon: Lanier Prude, MD;  Location: [facility] INVASIVE CV LAB;  Service: Cardiovascular;  Laterality: N/A;   WRIST FUSION      Allergies  Allergies  Allergen Reactions   Cefprozil     Nausea, felt bad   Statins     myalgias     Labs/Other Studies Reviewed    The following studies were reviewed today:  Cardiac Studies & Procedures       ECHOCARDIOGRAM  ECHOCARDIOGRAM COMPLETE 09/29/2022  Narrative ECHOCARDIOGRAM REPORT    Patient Name:   Steven Glover Date of Exam: 09/29/2022 Medical Rec #:  [id number]           Height:       75.0 in Accession #:    [id number]  Weight:       199.6 lb Date of Birth:  Aug 04, 1950            BSA:          2.193 m Patient Age:    72 years            BP:           127/72 mmHg Patient Gender: M                   HR:           70 bpm. Exam [facility]  Procedure: 2D Echo, Cardiac Doppler and Color Doppler  Indications:    Ventricular Tachycardia I47.2  History:        Patient has no prior history of Echocardiogram examinations. Arrythmias:Paroxysmal atrial fibrillation; Risk Factors:Dyslipidemia.  Sonographer:    Celesta Gentile RCS Referring  Phys: [phone] Jatziri Goffredo C Zymire Turnbo  IMPRESSIONS   1. Left ventricular ejection fraction, by estimation, is 60 to 65%. The left ventricle has normal function. The left ventricle has no regional wall motion abnormalities. Left ventricular diastolic parameters were normal. 2. Right ventricular systolic function is normal. The right ventricular size is normal. Tricuspid regurgitation signal is inadequate for assessing PA pressure. 3. The mitral valve is grossly normal. Trivial mitral valve regurgitation. 4. The aortic valve is tricuspid. Aortic valve regurgitation is trivial. Aortic valve sclerosis is present, with no evidence of aortic valve stenosis. Aortic valve mean gradient measures 7.0 mmHg. 5. The inferior vena cava is normal in size with greater than 50% respiratory variability, suggesting right atrial pressure of 3 mmHg.  Comparison(s): No prior Echocardiogram.  FINDINGS Left Ventricle: Left ventricular ejection fraction, by estimation, is 60 to 65%. The left ventricle has normal function. The left ventricle has no regional wall motion abnormalities. The left ventricular internal cavity size was normal in size. There is no left ventricular hypertrophy. Left ventricular diastolic parameters were normal.  Right Ventricle: The right ventricular size is normal. No increase in right ventricular wall thickness. Right ventricular systolic function is normal. Tricuspid regurgitation signal is inadequate for assessing PA pressure.  Left Atrium: Left atrial size was normal in size.  Right Atrium: Right atrial size was normal in size.  Pericardium: There is no evidence of pericardial effusion.  Mitral Valve: The mitral valve is grossly normal. Trivial mitral valve regurgitation.  Tricuspid Valve: The tricuspid valve is grossly normal. Tricuspid valve regurgitation is trivial.  Aortic Valve: The aortic valve is tricuspid. There is mild aortic valve annular calcification. Aortic valve regurgitation is  trivial. Aortic valve sclerosis is present, with no evidence of aortic valve stenosis. Aortic valve mean gradient measures 7.0 mmHg. Aortic valve peak gradient measures 16.0 mmHg. Aortic valve area, by VTI measures 1.77 cm.  Pulmonic Valve: The pulmonic valve was grossly normal. Pulmonic valve regurgitation is trivial.  Aorta: The aortic root is normal in size and structure.  Venous: The inferior vena cava is normal in size with greater than 50% respiratory variability, suggesting right atrial pressure of 3 mmHg.  IAS/Shunts: No atrial level shunt detected by color flow Doppler.   LEFT VENTRICLE PLAX 2D LVIDd:         4.70 cm   Diastology LVIDs:         3.10 cm   LV e' medial:    7.18 cm/s LV PW:         0.90 cm   LV E/e' medial:  11.1 LV  IVS:        0.80 cm   LV e' lateral:   8.59 cm/s LVOT diam:     1.80 cm   LV E/e' lateral: 9.3 LV SV:         65 LV SV Index:   29 LVOT Area:     2.54 cm   RIGHT VENTRICLE RV S prime:     12.20 cm/s TAPSE (M-mode): 2.4 cm  LEFT ATRIUM             Index        RIGHT ATRIUM           Index LA diam:        3.10 cm 1.41 cm/m   RA Area:     17.20 cm LA Vol (A2C):   52.2 ml 23.81 ml/m  RA Volume:   47.50 ml  21.66 ml/m LA Vol (A4C):   62.3 ml 28.41 ml/m LA Biplane Vol: 56.7 ml 25.86 ml/m AORTIC VALVE AV Area (Vmax):    1.84 cm AV Area (Vmean):   2.01 cm AV Area (VTI):     1.77 cm AV Vmax:           200.00 cm/s AV Vmean:          118.000 cm/s AV VTI:            0.365 m AV Peak Grad:      16.0 mmHg AV Mean Grad:      7.0 mmHg LVOT Vmax:         145.00 cm/s LVOT Vmean:        93.100 cm/s LVOT VTI:          0.254 m LVOT/AV VTI ratio: 0.70  AORTA Ao Root diam: 3.50 cm  MITRAL VALVE MV Area (PHT): 4.21 cm    SHUNTS MV Decel Time: 180 msec    Systemic VTI:  0.25 m MV E velocity: 79.50 cm/s  Systemic Diam: 1.80 cm MV A velocity: 67.90 cm/s MV E/A ratio:  1.17  Nona Dell MD Electronically signed by Nona Dell  MD Signature Date/Time: 09/29/2022/11:10:01 AM    Final   TEE  ECHO TEE 02/17/2023  Narrative TRANSESOPHOGEAL ECHO REPORT    Patient Name:   Steven Glover Date of Exam: 02/17/2023 Medical Rec #:  [id number]           Height:       75.0 in Accession #:    [id number]          Weight:       195.0 lb Date of Birth:  02-13-1951            BSA:          2.171 m Patient Age:    72 years            BP:           118/57 mmHg Patient Gender: M                   HR:           58 bpm. Exam Location:  Inpatient  Procedure: Transesophageal Echo  Indications:    Watchman Procedure  History:        Patient has prior history of Echocardiogram examinations. Arrythmias:Atrial Fibrillation.  Sonographer:    Darlys Gales Referring Phys: [phone] Lanier Prude  PROCEDURE: The transesophogeal probe was passed without difficulty through the esophogus of the patient. Sedation performed by different  physician. The patient developed no complications during the procedure.  IMPRESSIONS   1. Prior to procedure- patent left atrial appendage. Mean landing zone diameter of 2. 23 mm. Over 16 mm of wall distance. Suitable for 31 mm Watchman FLX Pro. 3. A transeptal puncture was performed with no complications. 4. A 31 mm Watchman FLX Pro was placed. There is no peri-device leak. No significant mitral shoulder. Average compression ~22%. No thrombus. 5. Expected, Iatrogenic shunting is all left to right. 6. No pericardial effusion. 7. Left ventricular ejection fraction, by estimation, is 60 to 65%. The left ventricle has normal function. 8. Right ventricular systolic function is normal. The right ventricular size is normal. 9. Left atrial size was mildly dilated. No left atrial/left atrial appendage thrombus was detected. 10. The mitral valve is normal in structure. Mild to moderate mitral valve regurgitation. No evidence of mitral stenosis. 11. The aortic valve is tricuspid. Aortic valve  regurgitation is trivial. 12. Evidence of atrial level shunting detected by color flow Doppler.  FINDINGS Left Ventricle: Left ventricular ejection fraction, by estimation, is 60 to 65%. The left ventricle has normal function. The left ventricular internal cavity size was normal in size.  Right Ventricle: The right ventricular size is normal. No increase in right ventricular wall thickness. Right ventricular systolic function is normal.  Left Atrium: Left atrial size was mildly dilated. No left atrial/left atrial appendage thrombus was detected.  Right Atrium: Right atrial size was normal in size.  Pericardium: There is no evidence of pericardial effusion.  Mitral Valve: The mitral valve is normal in structure. Mild to moderate mitral valve regurgitation. No evidence of mitral valve stenosis.  Tricuspid Valve: The tricuspid valve is normal in structure. Tricuspid valve regurgitation is mild . No evidence of tricuspid stenosis.  Aortic Valve: The aortic valve is tricuspid. Aortic valve regurgitation is trivial.  Pulmonic Valve: The pulmonic valve was normal in structure. Pulmonic valve regurgitation is trivial. No evidence of pulmonic stenosis.  Aorta: The aortic root, ascending aorta, aortic arch and descending aorta are all structurally normal, with no evidence of dilitation or obstruction.  IAS/Shunts: Evidence of atrial level shunting detected by color flow Doppler.   AORTIC VALVE LVOT Vmax:   113.00 cm/s LVOT Vmean:  76.300 cm/s LVOT VTI:    0.286 m   SHUNTS Systemic VTI: 0.29 m  Riley Lam MD Electronically signed by Riley Lam MD Signature Date/Time: 02/17/2023/9:08:59 AM    Final    CT SCANS  CT CORONARY MORPH W/CTA COR W/SCORE 02/26/2022  Addendum 02/26/2022 12:41 PM ADDENDUM REPORT: 02/26/2022 12:38  EXAM: OVER-READ INTERPRETATION CARDIAC CT CHEST  The following report is an over-read performed by radiologist Dr. Gaylyn Rong of  [facility] on 02/26/2022. This over-read does not include interpretation of cardiac or coronary anatomy or pathology. The coronary CTA interpretation by the cardiologist is attached.  COMPARISON:  02/07/2022  FINDINGS: Extracardiac Vascular: Unremarkable  Mediastinum: Moderate-sized hiatal hernia. Calcified mediastinal lymph nodes compatible with old granulomatous disease.  Lung: Stable scarring in the lingula and right middle lobe. Emphysema. Stable peripheral reticular interstitial accentuation.  Included Upper Abdomen: Unremarkable  Musculoskeletal: Mild thoracic spondylosis.  IMPRESSION: 1. Moderate-sized hiatal hernia. 2. Stable scarring in the lingula and right middle lobe. 3. Emphysema. 4. Stable peripheral reticular interstitial accentuation in the lungs, likely from chronic interstitial lung disease. 5. Calcified mediastinal lymph nodes compatible with old granulomatous disease.  Emphysema (ICD10-J43.9).   Electronically Signed By: Gaylyn Rong M.D. On: 02/26/2022 12:38  Narrative CLINICAL DATA:  Chest pain  EXAM: Cardiac CTA  MEDICATIONS: Sub lingual nitro. 4mg  and lopressor 50mg   TECHNIQUE: The patient was scanned on a Siemens Force 192 slice scanner. Gantry rotation speed was 250 msecs. Collimation was .6 mm. A 100 kV prospective scan was triggered in the ascending thoracic aorta at 140 HU's Full mA was used between 35% and 75% of the R-R interval. Average HR during the scan was 58 bpm. The 3D data set was interpreted on a dedicated work station using MPR, MIP and VRT modes. A total of 80cc of contrast was used.  FINDINGS: Non-cardiac: See separate report from [facility]. No significant findings on limited lung and soft tissue windows.  Calcium Score: Noted on LAD  Coronary Arteries: Right dominant with no anomalies  LM: Normal  LAD: 1-24% calcified plaque in ostial LAD and proximal vessel  IM: Large vessel  normal  D1: Normal  D2: Normal  Circumflex: Normal  OM1: Normal  OM2: Normal  RCA: Normal  PDA: Normal  PLA: Normal  IMPRESSION: 1.  Normal ascending thoracic aorta 3.6 cm  2.  Calcium score 38.2 which is only 25 th percentile for age / sex  42.  CAD RADS 1 non obstrucitve CAD see description above  [facility]  Electronically Signed: By: Charlton [facility] M.D. On: 02/26/2022 10:37         Recent Labs: 10/11/2022: ALT 21; TSH 4.219 01/18/2023: BUN 13; Creatinine, Ser 1.26; Hemoglobin 13.9; Platelets 134; Potassium 3.9; Sodium 141  Recent Lipid Panel    Component Value Date/Time   CHOL 125 08/31/2022 0809   TRIG 159 (H) 08/31/2022 0809   HDL 31 (L) 08/31/2022 0809   CHOLHDL 4.0 08/31/2022 0809   CHOLHDL 5.7 03/28/2013 0739   VLDL 43 (H) 03/28/2013 0739   LDLCALC 67 08/31/2022 0809    History of Present Illness    72 year old male with the above past medical history including mild nonobstructive CAD, paroxysmal atrial fibrillation s/p Watchman device, hyperlipidemia, hiatal hernia, GERD, and rheumatoid arthritis.   He was evaluated in the [facility], ED in September 2023 in the setting of sudden onset chest pressure that radiated to his arms and back.  CT of the chest showed mild calcifications, no dissection or PE.  Workup was overall unremarkable. Coronary CT angiogram in 02/2022 revealed coronary calcium score 38.2 (25th percentile), mild nonobstructive CAD.  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  During attempted ureteroscopy he had an elevated heart rate in the 150s. Follow-up with cardiology was advised.  Cardiac monitor in 09/2022 revealed paroxysmal atrial fibrillation.  He was started on Eliquis and amiodarone.  Echocardiogram was normal.  ABIs in the setting of bilateral leg pain were normal.  He was referred to EP and underwent ablation for  atrial fibrillation in 11/2022.  He underwent closure of left atrial with Watchman device on 02/17/2023 with plans to continue Eliquis for 45 days postprocedure followed by 6 months of Plavix therapy.  CT was scheduled to be completed 60 days postprocedure.   He presents today for follow-up. Since his last visit and since his procedure he has done well from a cardiac standpoint.  He denies any bleeding, his groin site has healed well. He denies any palpitations, dizziness, presyncope or syncope.  Denies symptoms concerning for angina. Overall, he reports feeling well.  Home Medications    Current Outpatient Medications  Medication Sig Dispense Refill   amiodarone (PACERONE) 200 MG tablet Take 1 tablet (200 mg total) by mouth daily. 30 tablet 6   apixaban (ELIQUIS) 5 MG TABS tablet Take 1 tablet by mouth twice daily 60 tablet 6   cetirizine (ZYRTEC) 10 MG tablet Take 10 mg by mouth daily as needed for allergies.     ezetimibe (ZETIA) 10 MG tablet Take 1 tablet by mouth once daily 90 tablet 0   folic acid (FOLVITE) 1 MG tablet Take 1 mg by mouth daily.       Golimumab (SIMPONI ARIA IV) Inject into the vein. Every 8 weeks     methotrexate 2.5 MG tablet Take 17.5 mg by mouth every Monday. 7 tablets-Monday     pantoprazole (PROTONIX) 40 MG tablet Take 1 tablet (40 mg total) by mouth 2 (two) times daily. 180 tablet 3   No current facility-administered medications for this visit.     Review of Systems    He denies chest pain, palpitations, dyspnea, pnd, orthopnea, n, v, dizziness, syncope, edema, weight gain, or early satiety. All other systems reviewed and are otherwise negative except as noted above.   Physical Exam    VS:  BP 120/60 (BP Location: Left Arm, Patient Position: Sitting, Cuff Size: Normal)   Pulse (!) 52   Ht 6\' 3"  (1.905 m)   Wt 192 lb (87.1 kg)   SpO2 98%   BMI 24.00 kg/m  GEN: Well nourished, well developed, in no acute distress. HEENT: normal. Neck: Supple, no JVD,  carotid bruits, or masses. Cardiac: RRR, no murmurs, rubs, or gallops. No clubbing, cyanosis, edema.  Radials/DP/PT 2+ and equal bilaterally.  Respiratory:  Respirations regular and unlabored, clear to auscultation bilaterally. GI: Soft, nontender, nondistended, BS + x 4. MS: no deformity or atrophy. Skin: warm and dry, no rash. Neuro:  Strength and sensation are intact. Psych: Normal affect.  Accessory Clinical Findings    ECG personally reviewed by me today - EKG Interpretation Date/Time:  Monday February 28 2023 10:38:22 EDT Ventricular Rate:  52 PR Interval:  182 QRS Duration:  96 QT Interval:  470 QTC Calculation: 437 R Axis:   19  Text Interpretation: Sinus bradycardia Cannot rule out Anterior infarct (cited on or before 28-Feb-2023) Confirmed by Bernadene Person ([id number]) on 02/28/2023 11:02:04 AM  - no acute changes.   Lab Results  Component Value Date   WBC 4.9 01/18/2023   HGB 13.9 01/18/2023   HCT 41.2 01/18/2023   MCV 96.0 01/18/2023   PLT 134 (L) 01/18/2023   Lab Results  Component Value Date   CREATININE 1.26 (H) 01/18/2023   BUN 13 01/18/2023   NA 141 01/18/2023   K 3.9 01/18/2023   CL 106 01/18/2023   CO2 21 (L) 01/18/2023   Lab Results  Component Value Date   ALT 21 10/11/2022   AST 24 10/11/2022   ALKPHOS 66 10/11/2022   BILITOT 0.5 10/11/2022   Lab Results  Component Value Date   CHOL 125 08/31/2022   HDL 31 (L) 08/31/2022   LDLCALC 67 08/31/2022   TRIG 159 (H) 08/31/2022   CHOLHDL 4.0 08/31/2022    No results found for: "HGBA1C"  Assessment & Plan    1. Paroxysmal atrial fibrillation: Diagnosed in 09/2022. S/p ablation in 11/2022, s/p Watchman on 02/17/2023. Maintaining SR. He is aware that he is to continue Eliquis for 45 days postprocedure, followed by 6 months of Plavix therapy.  Postprocedure CT has been scheduled. Discussed SBE prophylaxis  x 6 months.  Continue amiodarone.  Plan for follow-up with EP in 2 to 3 months (he will be due for routine  labs for amiodarone monitoring at that time), follow-up as scheduled with structural heart APP.   2. CAD: Coronary CT angiogram in 02/2022 revealed coronary calcium score 38.2 (25th percentile), mild nonobstructive CAD. Stable with no anginal symptoms. No indication for ischemic evaluation. Continue Zetia.    3. Hyperlipidemia: LDL was 71 in 05/2021. Continue Zetia.    4. Bilateral leg pain/numbness tingling: ABIs were unremarkable.  His symptoms have since resolved.  5. Disposition: Follow-up as scheduled with structural heart APP, follow-up in 2-3 months with EP, follow-up in 5 months with general cardiology,      Joylene Grapes, NP 02/28/2023, 11:55 AM

## 2023-02-28 NOTE — Patient Instructions (Signed)
Medication Instructions:  Your physician recommends that you continue on your current medications as directed. Please refer to the Current Medication list given to you today.  *If you need a refill on your cardiac medications before your next appointment, please call your pharmacy*   Lab Work: NONE ordered at this time of appointment    Testing/Procedures: NONE ordered at this time of appointment     Follow-Up: At [facility], you and your health needs are our priority.  As part of our continuing mission to provide you with exceptional heart care, we have created designated Provider Care Teams.  These Care Teams include your primary Cardiologist (physician) and Advanced Practice Providers (APPs -  Physician Assistants and Nurse Practitioners) who all work together to provide you with the care you need, when you need it.  We recommend signing up for the patient portal called "MyChart".  Sign up information is provided on this After Visit Summary.  MyChart is used to connect with patients for Virtual Visits (Telemedicine).  Patients are able to view lab/test results, encounter notes, upcoming appointments, etc.  Non-urgent messages can be sent to your provider as well.   To learn more about what you can do with MyChart, go to [email or web address].    Your next appointment:   2-3 (EP, Dr. Lalla Brothers) 5 months [facility]  Provider:   Bernadene Person, NP        Other Instructions

## 2023-03-01 ENCOUNTER — Ambulatory Visit: Payer: Medicare Other | Admitting: Family Medicine

## 2023-03-02 ENCOUNTER — Encounter: Payer: Self-pay | Admitting: Family Medicine

## 2023-03-02 ENCOUNTER — Ambulatory Visit: Payer: Medicare Other | Admitting: Family Medicine

## 2023-03-02 VITALS — BP 128/82 | HR 53 | Temp 97.8°F | Ht 73.5 in | Wt 194.8 lb

## 2023-03-02 DIAGNOSIS — Z7689 Persons encountering health services in other specified circumstances: Secondary | ICD-10-CM

## 2023-03-02 DIAGNOSIS — E785 Hyperlipidemia, unspecified: Secondary | ICD-10-CM | POA: Diagnosis not present

## 2023-03-02 DIAGNOSIS — Z23 Encounter for immunization: Secondary | ICD-10-CM

## 2023-03-02 NOTE — Patient Instructions (Addendum)
-  It was a pleasure to meet you and look forward to taking care of you.  -Influenza vaccine provided today. -Recommend to obtain Zoster vaccine (Shingles vaccine) at your local pharmacy.  -Call the pharmacy, send a MyChart message, or call the office for a refill on Zetia when needed. -Follow up in 6 months for chronic management. Please be fasting at that appointment.

## 2023-03-02 NOTE — Progress Notes (Unsigned)
New Patient Office Visit  Subjective    Patient ID: Steven Glover, male    DOB: Apr 16, 1951  Age: 72 y.o. MRN: [id number]  CC:  Chief Complaint  Patient presents with  . Establish Care    Vaccines Last doctor prescribed Zetia, will need primary for that     HPI Steven Glover presents to establish care with provider.   Patients previous primary care provider: [facility], Dr. Lilyan Punt. Last visit was 08/30/2022. Transitioning due to possible retirement.   Specialist: [facility] in [place], Dr. June Leap  [facility] [place], Dr. Dietrich Pates [facility] [place], Dr. Steffanie Dunn  [facility], Dr. Earnest Bailey  [facility] with Dr. Alben Deeds  [facility], Dr Modena Slater  [facility], Dr. Magnus Ivan   Hyperlipidemia: Chronic. Patient is taking Ezetimibe 10mg  daily. Can not tolerate statins.  Outpatient Encounter Medications as of 03/02/2023  Medication Sig  . amiodarone (PACERONE) 200 MG tablet Take 1 tablet (200 mg total) by mouth daily.  Marland Kitchen apixaban (ELIQUIS) 5 MG TABS tablet Take 1 tablet by mouth twice daily  . cetirizine (ZYRTEC) 10 MG tablet Take 10 mg by mouth daily as needed for allergies.  Marland Kitchen ezetimibe (ZETIA) 10 MG tablet Take 1 tablet by mouth once daily  . folic acid (FOLVITE) 1 MG tablet Take 1 mg by mouth daily.    . Golimumab (SIMPONI ARIA IV) Inject into the vein. Every 8 weeks  . methotrexate 2.5 MG tablet Take 17.5 mg by mouth every Monday. 7 tablets-Monday  . pantoprazole (PROTONIX) 40 MG tablet Take 1 tablet (40 mg total) by mouth 2 (two) times daily.   No facility-administered encounter medications on file as of 03/02/2023.    Past Medical History:  Diagnosis Date  . Acid reflux disease   . Allergy   . Asthma    as child  . Atrial fibrillation (HCC)   . BMI between 19-24,adult JUL 2012 191.8 LBS  . Collagen vascular  disease (HCC)   . GERD (gastroesophageal reflux disease)   . Internal hemorrhoids with other complication    TCS DEC 2013  . Presence of Watchman left atrial appendage closure device 02/17/2023   31mm Watchman FLX Pro placed by Dr. Lalla Brothers  . Pulmonary nodule 2013   [facility] - Repeat 08/2012  . Rheumatoid arthritis(714.0)   . Skin cancer     Past Surgical History:  Procedure Laterality Date  . ATRIAL FIBRILLATION ABLATION N/A 12/21/2022   Procedure: ATRIAL FIBRILLATION ABLATION;  Surgeon: Lanier Prude, MD;  Location: [facility] INVASIVE CV LAB;  Service: Cardiovascular;  Laterality: N/A;  . COLONOSCOPY  2009 NUR [facility]   MULTIPLE SIMPLE ADENOMAS  . COLONOSCOPY N/A 05/02/2017   Dr. Darrick Penna: 4 simple adenomas removed, diverticulosis.  Next colonoscopy in 3 years.  . COLONOSCOPY W/ POLYPECTOMY  2010 NUR  . COLONOSCOPY WITH PROPOFOL  05/15/2012   SLF:Two sessile polyps ranging between 3-55mm in size were found in the ascending colon and rectum; multiple biopsies were performed/Mild diverticulosis was noted in the sigmoid colon/The colon mucosa was otherwise normal/ Moderate sized internal hemorrhoids. TCS 04/2017.  Marland Kitchen COLONOSCOPY WITH PROPOFOL N/A 04/29/2020   Procedure: COLONOSCOPY WITH PROPOFOL;  Surgeon: Lanelle Bal, DO;  Location: [facility];  Service: Endoscopy;  Laterality: N/A;  7:30am  . ESOPHAGOGASTRODUODENOSCOPY  01/01/2011 [id number] CHRONIC GASTRITIS   SLF: Hiatal Hernia/mild gastritis/stricture in the distal esophagus  . FOOT SURGERY Left   .  LEFT ATRIAL APPENDAGE OCCLUSION N/A 02/17/2023   Procedure: LEFT ATRIAL APPENDAGE OCCLUSION;  Surgeon: Lanier Prude, MD;  Location: [facility] INVASIVE CV LAB;  Service: Cardiovascular;  Laterality: N/A;  . POLYPECTOMY  05/15/2012   SIMPLE ADENOMA(2)  . POLYPECTOMY  04/29/2020   Procedure: POLYPECTOMY;  Surgeon: Lanelle Bal, DO;  Location: [facility];  Service: Endoscopy;;  . SAVORY DILATION  01/01/2011   Procedure: SAVORY DILATION;   Surgeon: Arlyce Harman, MD;  Location: [facility];  Service: Endoscopy;  Laterality: N/A;  . SHOULDER OPEN ROTATOR CUFF REPAIR  2005  . TEE WITHOUT CARDIOVERSION N/A 02/17/2023   Procedure: TRANSESOPHAGEAL ECHOCARDIOGRAM;  Surgeon: Lanier Prude, MD;  Location: [facility] INVASIVE CV LAB;  Service: Cardiovascular;  Laterality: N/A;  . WRIST FUSION      Family History  Problem Relation Age of Onset  . Heart disease Mother        Leaky Heart Valve  . Diabetes Father   . Cancer Sister        breast  . Cancer Brother        "intestines"  . Congestive Heart Failure Brother   . Crohn's disease Daughter   . Cancer Maternal Grandmother   . Colon cancer Neg Hx   . Colon polyps Neg Hx     Social History   Socioeconomic History  . Marital status: Married    Spouse name: Not on file  . Number of children: 2  . Years of education: Not on file  . Highest education level: High school graduate  Occupational History  . Not on file  Tobacco Use  . Smoking status: Never    Passive exposure: Never  . Smokeless tobacco: Never  . Tobacco comments:    Never smoked 01/18/23  Vaping Use  . Vaping status: Never Used  Substance and Sexual Activity  . Alcohol use: No  . Drug use: No  . Sexual activity: Not Currently    Birth control/protection: None  Other Topics Concern  . Not on file  Social History Narrative   MARRIED-Daughter, [name], nurse (ED [employer]), 2 CHILDREN   Social Determinants of Health   Financial Resource Strain: Low Risk  (03/02/2023)   Overall Financial Resource Strain (CARDIA)   . Difficulty of Paying Living Expenses: Not hard at all  Food Insecurity: No Food Insecurity (02/17/2023)   Hunger Vital Sign   . Worried About Programme researcher, broadcasting/film/video in the Last Year: Never true   . Ran Out of Food in the Last Year: Never true  Transportation Needs: No Transportation Needs (02/17/2023)   PRAPARE - Transportation   . Lack of Transportation (Medical): No   . Lack of  Transportation (Non-Medical): No  Physical Activity: Sufficiently Active (03/02/2023)   Exercise Vital Sign   . Days of Exercise per Week: 7 days   . Minutes of Exercise per Session: 150+ min  Stress: No Stress Concern Present (03/02/2023)   [employer] of Occupational Health - Occupational Stress Questionnaire   . Feeling of Stress : Not at all  Social Connections: Socially Integrated (03/02/2023)   Social Connection and Isolation Panel [NHANES]   . Frequency of Communication with Friends and Family: More than three times a week   . Frequency of Social Gatherings with Friends and Family: Three times a week   . Attends Religious Services: More than 4 times per year   . Active Member of Clubs or Organizations: Yes   . Attends Club or  Organization Meetings: More than 4 times per year   . Marital Status: Married  Catering manager Violence: Not At Risk (02/17/2023)   Humiliation, Afraid, Rape, and Kick questionnaire   . Fear of Current or Ex-Partner: No   . Emotionally Abused: No   . Physically Abused: No   . Sexually Abused: No    ROS See HPI above    Objective   BP 128/82 (BP Location: Left Arm, Patient Position: Sitting, Cuff Size: Normal)   Pulse (!) 53   Temp 97.8 F (36.6 C) (Oral)   Ht 6' 1.5" (1.867 m)   Wt 194 lb 12.8 oz (88.4 kg)   SpO2 98%   BMI 25.35 kg/m   Physical Exam Vitals reviewed.  Constitutional:      General: He is not in acute distress.    Appearance: Normal appearance. He is normal weight. He is not ill-appearing, toxic-appearing or diaphoretic.  HENT:     Head: Normocephalic and atraumatic.  Eyes:     General:        Right eye: No discharge.        Left eye: No discharge.     Conjunctiva/sclera: Conjunctivae normal.     Comments: Wears glasses  Cardiovascular:     Rate and Rhythm: Regular rhythm. Bradycardia present.     Heart sounds: Normal heart sounds. No murmur heard.    No friction rub. No gallop.  Pulmonary:     Effort: Pulmonary  effort is normal. No respiratory distress.     Breath sounds: Normal breath sounds. No wheezing.  Musculoskeletal:        General: Normal range of motion.     Cervical back: Neck supple.  Skin:    General: Skin is warm.  Neurological:     General: No focal deficit present.     Mental Status: He is alert and oriented to person, place, and time. Mental status is at baseline.  Psychiatric:        Mood and Affect: Mood normal.        Behavior: Behavior normal.        Thought Content: Thought content normal.        Judgment: Judgment normal.      Assessment & Plan:  Dyslipidemia (high LDL; low HDL)  Influenza vaccine needed -     Flu Vaccine Trivalent High Dose (Fluad)  Encounter to establish care   1.Review health maintenance: -Influenza vaccine: Ordered -Zoster vaccine: Recommend to obtain at local pharmacy  -Covid booster: Declines  Return in about 6 months (around 08/31/2023) for chronic management-please be fasting .   Zandra Abts, NP

## 2023-03-03 NOTE — Assessment & Plan Note (Signed)
Stable. Continue Ezetimibe 10mg  daily. Does not need a refill at this time. Will check lipid panel on next chronic management visit when he is fasting.

## 2023-03-16 ENCOUNTER — Encounter: Payer: Self-pay | Admitting: Family Medicine

## 2023-03-23 ENCOUNTER — Ambulatory Visit: Payer: Medicare Other

## 2023-03-28 ENCOUNTER — Ambulatory Visit: Payer: Medicare Other | Attending: Cardiovascular Disease | Admitting: Cardiology

## 2023-03-28 VITALS — BP 146/66 | HR 51 | Ht 73.5 in | Wt 196.0 lb

## 2023-03-28 DIAGNOSIS — I10 Essential (primary) hypertension: Secondary | ICD-10-CM | POA: Diagnosis not present

## 2023-03-28 DIAGNOSIS — I2510 Atherosclerotic heart disease of native coronary artery without angina pectoris: Secondary | ICD-10-CM | POA: Diagnosis present

## 2023-03-28 DIAGNOSIS — I480 Paroxysmal atrial fibrillation: Secondary | ICD-10-CM | POA: Insufficient documentation

## 2023-03-28 DIAGNOSIS — Z95818 Presence of other cardiac implants and grafts: Secondary | ICD-10-CM | POA: Insufficient documentation

## 2023-03-28 MED ORDER — CLOPIDOGREL BISULFATE 75 MG PO TABS: 75.0000 mg | ORAL_TABLET | Freq: Every day | ORAL | 3 refills | Status: DC

## 2023-03-28 NOTE — Patient Instructions (Addendum)
Medication Instructions:  Stop Eliquis  Starting tomorrow take Clopidogrel (Plavix) 75 mg daily   *If you need a refill on your cardiac medications before your next appointment, please call your pharmacy*   Lab Work: BMET- Today   If you have labs (blood work) drawn today and your tests are completely normal, you will receive your results only by: MyChart Message (if you have MyChart) OR A paper copy in the mail If you have any lab test that is abnormal or we need to change your treatment, we will call you to review the results.   Testing/Procedures: None ordered    Follow-Up: Follow up as needed   Other Instructions

## 2023-03-28 NOTE — Progress Notes (Signed)
[facility]                                     Cardiology Office Note:    Date:  03/28/2023   ID:  Steven Glover, DOB [birth year], MRN [id number]  PCP:  Alveria Apley, NP  [facility] Cardiologist:  Dietrich Pates, MD  [facility] Electrophysiologist:  Lanier Prude, MD   Referring MD: No ref. provider found   Chief Complaint  Patient presents with   1 month s/p LAAO   History of Present Illness:    Steven Glover is a 72 y.o. male with a hx of mild nonobstructive CAD, HLD, hiatal hernia, rheumatoid arthritis, bladder tumor, and PAF who underwent AF ablation 12/21/22 and is now s/p Watchman and presents today for 1 month follow up.    Steven Glover's AF was discovered when he presented for a urologic procedure in April 2024. A heart monitor was ordered that ultimately showed 16% AF. He was started on Eliquis and amiodarone in 09/2022 as a bridge to ablation that was performed 12/21/22. He is now s/p successful LAAO with 31mm Watchman FLX Pro device 02/17/2023. He was restarted on Eliquis the evening of his procedure with plans to continue for 45 days then stop. At that time he will transition to Plavix 75mg  daily to complete 6 months of therapy (08/17/2023).   He is here today alone and reports that he has been well since he was last seen with no complaints of chest pain, SOB, palpitations, LE edema, orthopnea, PND, dizziness, or syncope. Denies bleeding in stool or urine. 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   Past Medical History:  Diagnosis Date   Acid reflux disease    Allergy    Asthma    as child   Atrial fibrillation (HCC)    BMI between 19-24,adult JUL 2012 191.8 LBS   Collagen vascular disease (HCC)    GERD (gastroesophageal reflux disease)    Internal hemorrhoids with other complication    TCS DEC 2013   Presence of Watchman left atrial appendage closure device 02/17/2023   31mm Watchman FLX Pro placed by  Dr. Lalla Brothers   Pulmonary nodule 2013   [facility] - Repeat 08/2012   Rheumatoid arthritis(714.0)    Skin cancer     Past Surgical History:  Procedure Laterality Date   ATRIAL FIBRILLATION ABLATION N/A 12/21/2022   Procedure: ATRIAL FIBRILLATION ABLATION;  Surgeon: Lanier Prude, MD;  Location: [facility]AS[facility] CV LAB;  Service: Cardiovascular;  Laterality: N/A;   COLONOSCOPY  2009 NUR [facility]   MULTIPLE SIMPLE ADENOMAS   COLONOSCOPY N/A 05/02/2017   Dr. Darrick Penna: 4 simple adenomas removed, diverticulosis.  Next colonoscopy in 3 years.   COLONOSCOPY W/ POLYPECTOMY  2010 NUR   COLONOSCOPY WITH PROPOFOL  05/15/2012   SLF:Two sessile polyps ranging between 3-55mm in size were found in the ascending colon and rectum; multiple biopsies were performed/Mild diverticulosis was noted in the sigmoid colon/The colon mucosa was otherwise normal/ Moderate sized internal hemorrhoids. TCS 04/2017.   COLONOSCOPY WITH PROPOFOL N/A 04/29/2020   Procedure: COLONOSCOPY WITH PROPOFOL;  Surgeon: Lanelle Bal, DO;  Location: [facility];  Service: Endoscopy;  Laterality: N/A;  7:30am   ESOPHAGOGASTRODUODENOSCOPY  01/01/2011 FA:OZHY CHRONIC GASTRITIS   SLF: Hiatal Hernia/mild gastritis/stricture in the distal esophagus   FOOT SURGERY Left  LEFT ATRIAL APPENDAGE OCCLUSION N/A 02/17/2023   Procedure: LEFT ATRIAL APPENDAGE OCCLUSION;  Surgeon: Lanier Prude, MD;  Location: [facility];  Service: Cardiovascular;  Laterality: N/A;   POLYPECTOMY  05/15/2012   SIMPLE ADENOMA(2)   POLYPECTOMY  04/29/2020   Procedure: POLYPECTOMY;  Surgeon: Lanelle Bal, DO;  Location: [facility];  Service: Endoscopy;;   SAVORY DILATION  01/01/2011   Procedure: SAVORY DILATION;  Surgeon: Arlyce Harman, MD;  Location: [facility];  Service: Endoscopy;  Laterality: N/A;   SHOULDER OPEN ROTATOR CUFF REPAIR  2005   TEE WITHOUT CARDIOVERSION N/A 02/17/2023   Procedure: TRANSESOPHAGEAL ECHOCARDIOGRAM;  Surgeon: Lanier Prude, MD;  Location: [facility] INVASIVE CV LAB;  Service: Cardiovascular;  Laterality: N/A;   WRIST FUSION      Current Medications: Current Meds  Medication Sig   amiodarone (PACERONE) 200 MG tablet Take 1 tablet (200 mg total) by mouth daily.   cetirizine (ZYRTEC) 10 MG tablet Take 10 mg by mouth daily as needed for allergies.   clopidogrel (PLAVIX) 75 MG tablet Take 1 tablet (75 mg total) by mouth daily.   ezetimibe (ZETIA) 10 MG tablet Take 1 tablet by mouth once daily   folic acid (FOLVITE) 1 MG tablet Take 1 mg by mouth daily.     Golimumab (SIMPONI ARIA IV) Inject into the vein. Every 8 weeks   methotrexate 2.5 MG tablet Take 17.5 mg by mouth every Monday. 7 tablets-Monday   pantoprazole (PROTONIX) 40 MG tablet Take 1 tablet (40 mg total) by mouth 2 (two) times daily.   [DISCONTINUED] apixaban (ELIQUIS) 5 MG TABS tablet Take 1 tablet by mouth twice daily     Allergies:   Cefprozil and Statins   Social History   Socioeconomic History   Marital status: Married    Spouse name: Not on file   Number of children: 2   Years of education: Not on file   Highest education level: High school graduate  Occupational History   Not on file  Tobacco Use   Smoking status: Never    Passive exposure: Never   Smokeless tobacco: Never   Tobacco comments:    Never smoked 01/18/23  Vaping Use   Vaping status: Never Used  Substance and Sexual Activity   Alcohol use: No   Drug use: No   Sexual activity: Not Currently    Birth control/protection: None  Other Topics Concern   Not on file  Social History Narrative   MARRIED-Daughter, [name], nurse ([employer]), 2 CHILDREN   Social Determinants of Health   Financial Resource Strain: Low Risk  (03/02/2023)   Overall Financial Resource Strain (CARDIA)    Difficulty of Paying Living Expenses: Not hard at all  Food Insecurity: No Food Insecurity (02/17/2023)   Hunger Vital Sign    Worried About Running Out of Food in the Last Year: Never  true    Ran Out of Food in the Last Year: Never true  Transportation Needs: No Transportation Needs (02/17/2023)   PRAPARE - Administrator, Civil Service (Medical): No    Lack of Transportation (Non-Medical): No  Physical Activity: Sufficiently Active (03/02/2023)   Exercise Vital Sign    Days of Exercise per Week: 7 days    Minutes of Exercise per Session: 150+ min  Stress: No Stress Concern Present (03/02/2023)   [employer] of Occupational Health - Occupational Stress Questionnaire    Feeling of Stress : Not at all  Social Connections: Socially Integrated (03/02/2023)   Social Connection and Isolation Panel [NHANES]    Frequency of Communication with Friends and Family: More than three times a week    Frequency of Social Gatherings with Friends and Family: Three times a week    Attends Religious Services: More than 4 times per year    Active Member of Clubs or Organizations: Yes    Attends Engineer, structural: More than 4 times per year    Marital Status: Married     Family History: The patient's family history includes Cancer in his brother, maternal grandmother, and sister; Congestive Heart Failure in his brother; Crohn's disease in his daughter; Diabetes in his father; Heart disease in his mother. There is no history of Colon cancer or Colon polyps.  ROS:   Please see the history of present illness.    All other systems reviewed and are negative.  EKGs/Labs/Other Studies Reviewed:    The following studies were reviewed today:   Cardiac Studies & Procedures       ECHOCARDIOGRAM  ECHOCARDIOGRAM COMPLETE 09/29/2022  Narrative ECHOCARDIOGRAM REPORT    Patient Name:   Steven Glover Date of Exam: 09/29/2022 Medical Rec #:  [id number]           Height:       75.0 in Accession #:    [id number]          Weight:       199.6 lb Date of Birth:  07/03/1950            BSA:          2.193 m Patient Age:    72 years            BP:           127/72  mmHg Patient Gender: M                   HR:           70 bpm. [facility]  Procedure: 2D Echo, Cardiac Doppler and Color Doppler  Indications:    Ventricular Tachycardia I47.2  History:        Patient has no prior history of Echocardiogram examinations. Arrythmias:Paroxysmal atrial fibrillation; Risk Factors:Dyslipidemia.  Sonographer:    Celesta Gentile RCS Referring Phys: [phone] EMILY C MONGE  IMPRESSIONS   1. Left ventricular ejection fraction, by estimation, is 60 to 65%. The left ventricle has normal function. The left ventricle has no regional wall motion abnormalities. Left ventricular diastolic parameters were normal. 2. Right ventricular systolic function is normal. The right ventricular size is normal. Tricuspid regurgitation signal is inadequate for assessing PA pressure. 3. The mitral valve is grossly normal. Trivial mitral valve regurgitation. 4. The aortic valve is tricuspid. Aortic valve regurgitation is trivial. Aortic valve sclerosis is present, with no evidence of aortic valve stenosis. Aortic valve mean gradient measures 7.0 mmHg. 5. The inferior vena cava is normal in size with greater than 50% respiratory variability, suggesting right atrial pressure of 3 mmHg.  Comparison(s): No prior Echocardiogram.  FINDINGS Left Ventricle: Left ventricular ejection fraction, by estimation, is 60 to 65%. The left ventricle has normal function. The left ventricle has no regional wall motion abnormalities. The left ventricular internal cavity size was normal in size. There is no left ventricular hypertrophy. Left ventricular diastolic parameters were normal.  Right Ventricle: The right ventricular size is normal. No increase in right ventricular wall thickness. Right ventricular systolic  function is normal. Tricuspid regurgitation signal is inadequate for assessing PA pressure.  Left Atrium: Left atrial size was normal in size.  Right Atrium: Right atrial size was  normal in size.  Pericardium: There is no evidence of pericardial effusion.  Mitral Valve: The mitral valve is grossly normal. Trivial mitral valve regurgitation.  Tricuspid Valve: The tricuspid valve is grossly normal. Tricuspid valve regurgitation is trivial.  Aortic Valve: The aortic valve is tricuspid. There is mild aortic valve annular calcification. Aortic valve regurgitation is trivial. Aortic valve sclerosis is present, with no evidence of aortic valve stenosis. Aortic valve mean gradient measures 7.0 mmHg. Aortic valve peak gradient measures 16.0 mmHg. Aortic valve area, by VTI measures 1.77 cm.  Pulmonic Valve: The pulmonic valve was grossly normal. Pulmonic valve regurgitation is trivial.  Aorta: The aortic root is normal in size and structure.  Venous: The inferior vena cava is normal in size with greater than 50% respiratory variability, suggesting right atrial pressure of 3 mmHg.  IAS/Shunts: No atrial level shunt detected by color flow Doppler.   LEFT VENTRICLE PLAX 2D LVIDd:         4.70 cm   Diastology LVIDs:         3.10 cm   LV e' medial:    7.18 cm/s LV PW:         0.90 cm   LV E/e' medial:  11.1 LV IVS:        0.80 cm   LV e' lateral:   8.59 cm/s LVOT diam:     1.80 cm   LV E/e' lateral: 9.3 LV SV:         65 LV SV Index:   29 LVOT Area:     2.54 cm   RIGHT VENTRICLE RV S prime:     12.20 cm/s TAPSE (M-mode): 2.4 cm  LEFT ATRIUM             Index        RIGHT ATRIUM           Index LA diam:        3.10 cm 1.41 cm/m   RA Area:     17.20 cm LA Vol (A2C):   52.2 ml 23.81 ml/m  RA Volume:   47.50 ml  21.66 ml/m LA Vol (A4C):   62.3 ml 28.41 ml/m LA Biplane Vol: 56.7 ml 25.86 ml/m AORTIC VALVE AV Area (Vmax):    1.84 cm AV Area (Vmean):   2.01 cm AV Area (VTI):     1.77 cm AV Vmax:           200.00 cm/s AV Vmean:          118.000 cm/s AV VTI:            0.365 m AV Peak Grad:      16.0 mmHg AV Mean Grad:      7.0 mmHg LVOT Vmax:          145.00 cm/s LVOT Vmean:        93.100 cm/s LVOT VTI:          0.254 m LVOT/AV VTI ratio: 0.70  AORTA Ao Root diam: 3.50 cm  MITRAL VALVE MV Area (PHT): 4.21 cm    SHUNTS MV Decel Time: 180 msec    Systemic VTI:  0.25 m MV E velocity: 79.50 cm/s  Systemic Diam: 1.80 cm MV A velocity: 67.90 cm/s MV E/A ratio:  1.17  Nona Dell MD Electronically signed by  Nona Dell MD Signature Date/Time: 09/29/2022/11:10:01 AM    Final   TEE  ECHO TEE 02/17/2023  Narrative TRANSESOPHOGEAL ECHO REPORT    Patient Name:   Steven Glover Date of Exam: 02/17/2023 Medical Rec #:  [id number]           Height:       75.0 in Accession #:    [id number]          Weight:       195.0 lb Date of Birth:  04/02/1951            BSA:          2.171 m Patient Age:    72 years            BP:           118/57 mmHg Patient Gender: M                   HR:           58 bpm. Exam Location:  Inpatient  Procedure: Transesophageal Echo  Indications:    Watchman Procedure  History:        Patient has prior history of Echocardiogram examinations. Arrythmias:Atrial Fibrillation.  Sonographer:    Darlys Gales Referring Phys: [id number] Lanier Prude  PROCEDURE: The transesophogeal probe was passed without difficulty through the esophogus of the patient. Sedation performed by different physician. The patient developed no complications during the procedure.  IMPRESSIONS   1. Prior to procedure- patent left atrial appendage. Mean landing zone diameter of 2. 23 mm. Over 16 mm of wall distance. Suitable for 31 mm Watchman FLX Pro. 3. A transeptal puncture was performed with no complications. 4. A 31 mm Watchman FLX Pro was placed. There is no peri-device leak. No significant mitral shoulder. Average compression ~22%. No thrombus. 5. Expected, Iatrogenic shunting is all left to right. 6. No pericardial effusion. 7. Left ventricular ejection fraction, by estimation, is 60 to 65%. The left ventricle  has normal function. 8. Right ventricular systolic function is normal. The right ventricular size is normal. 9. Left atrial size was mildly dilated. No left atrial/left atrial appendage thrombus was detected. 10. The mitral valve is normal in structure. Mild to moderate mitral valve regurgitation. No evidence of mitral stenosis. 11. The aortic valve is tricuspid. Aortic valve regurgitation is trivial. 12. Evidence of atrial level shunting detected by color flow Doppler.  FINDINGS Left Ventricle: Left ventricular ejection fraction, by estimation, is 60 to 65%. The left ventricle has normal function. The left ventricular internal cavity size was normal in size.  Right Ventricle: The right ventricular size is normal. No increase in right ventricular wall thickness. Right ventricular systolic function is normal.  Left Atrium: Left atrial size was mildly dilated. No left atrial/left atrial appendage thrombus was detected.  Right Atrium: Right atrial size was normal in size.  Pericardium: There is no evidence of pericardial effusion.  Mitral Valve: The mitral valve is normal in structure. Mild to moderate mitral valve regurgitation. No evidence of mitral valve stenosis.  Tricuspid Valve: The tricuspid valve is normal in structure. Tricuspid valve regurgitation is mild . No evidence of tricuspid stenosis.  Aortic Valve: The aortic valve is tricuspid. Aortic valve regurgitation is trivial.  Pulmonic Valve: The pulmonic valve was normal in structure. Pulmonic valve regurgitation is trivial. No evidence of pulmonic stenosis.  Aorta: The aortic root, ascending aorta, aortic arch and descending aorta are all structurally normal, with no evidence  of dilitation or obstruction.  IAS/Shunts: Evidence of atrial level shunting detected by color flow Doppler.   AORTIC VALVE LVOT Vmax:   113.00 cm/s LVOT Vmean:  76.300 cm/s LVOT VTI:    0.286 m   SHUNTS Systemic VTI: 0.29 m  Riley Lam  MD Electronically signed by Riley Lam MD Signature Date/Time: 02/17/2023/9:08:59 AM    Final    CT SCANS  CT CORONARY MORPH W/CTA COR W/SCORE 02/26/2022  Addendum 02/26/2022 12:41 PM ADDENDUM REPORT: 02/26/2022 12:38  EXAM: OVER-READ INTERPRETATION CARDIAC CT CHEST  The following report is an over-read performed by radiologist Dr. Gaylyn Rong of [facility] on 02/26/2022. This over-read does not include interpretation of cardiac or coronary anatomy or pathology. The coronary CTA interpretation by the cardiologist is attached.  COMPARISON:  02/07/2022  FINDINGS: Extracardiac Vascular: Unremarkable  Mediastinum: Moderate-sized hiatal hernia. Calcified mediastinal lymph nodes compatible with old granulomatous disease.  Lung: Stable scarring in the lingula and right middle lobe. Emphysema. Stable peripheral reticular interstitial accentuation.  Included Upper Abdomen: Unremarkable  Musculoskeletal: Mild thoracic spondylosis.  IMPRESSION: 1. Moderate-sized hiatal hernia. 2. Stable scarring in the lingula and right middle lobe. 3. Emphysema. 4. Stable peripheral reticular interstitial accentuation in the lungs, likely from chronic interstitial lung disease. 5. Calcified mediastinal lymph nodes compatible with old granulomatous disease.  Emphysema (ICD10-J43.9).   Electronically Signed By: Gaylyn Rong M.D. On: 02/26/2022 12:38  Narrative CLINICAL DATA:  Chest pain  EXAM: Cardiac CTA  MEDICATIONS: Sub lingual nitro. 4mg  and lopressor 50mg   TECHNIQUE: The patient was scanned on a Siemens Force 192 slice scanner. Gantry rotation speed was 250 msecs. Collimation was .6 mm. A 100 kV prospective scan was triggered in the ascending thoracic aorta at 140 HU's Full mA was used between 35% and 75% of the R-R interval. Average HR during the scan was 58 bpm. The 3D data set was interpreted on a dedicated work station using MPR, MIP  and VRT modes. A total of 80cc of contrast was used.  FINDINGS: Non-cardiac: See separate report from [facility]. No significant findings on limited lung and soft tissue windows.  Calcium Score: Noted on LAD  Coronary Arteries: Right dominant with no anomalies  LM: Normal  LAD: 1-24% calcified plaque in ostial LAD and proximal vessel  IM: Large vessel normal  D1: Normal  D2: Normal  Circumflex: Normal  OM1: Normal  OM2: Normal  RCA: Normal  PDA: Normal  PLA: Normal  IMPRESSION: 1.  Normal ascending thoracic aorta 3.6 cm  2.  Calcium score 38.2 which is only 25 th percentile for age / sex  78.  CAD RADS 1 non obstrucitve CAD see description above  [facility]  Electronically Signed: By: [facility] M.D. On: 02/26/2022 10:37          EKG:  EKG is not ordered today.    Recent Labs: 10/11/2022: ALT 21; TSH 4.219 01/18/2023: BUN 13; Creatinine, Ser 1.26; Hemoglobin 13.9; Platelets 134; Potassium 3.9; Sodium 141   Recent Lipid Panel    Component Value Date/Time   CHOL 125 08/31/2022 0809   TRIG 159 (H) 08/31/2022 0809   HDL 31 (L) 08/31/2022 0809   CHOLHDL 4.0 08/31/2022 0809   CHOLHDL 5.7 03/28/2013 0739   VLDL 43 (H) 03/28/2013 0739   LDLCALC 67 08/31/2022 0809   Risk Assessment/Calculations:    HAS-BLED score 1 Hypertension No  Abnormal renal and liver function (Dialysis, transplant, Cr >2.26 mg/dL /Cirrhosis or Bilirubin >2x Normal or AST/ALT/AP >  3x Normal) No  Stroke No  Bleeding No Labile INR (Unstable/high INR) No  Elderly (>65) Yes  Drugs or alcohol (>= 8 drinks/week, anti-plt or NSAID) No   CHA2DS2-VASc Score = 2  The patient's score is based upon: CHF History: 0 HTN History: 0 Diabetes History: 0 Stroke History: 0 Vascular Disease History: 1 (coronary calcifications on CT) Age Score: 1 Gender Score: 0  Physical Exam:    VS:  BP (!) 146/66   Pulse (!) 51   Ht 6' 1.5" (1.867 m)   Wt 196 lb (88.9 kg)   SpO2 98%    BMI 25.51 kg/m     Wt Readings from Last 3 Encounters:  03/28/23 196 lb (88.9 kg)  03/02/23 194 lb 12.8 oz (88.4 kg)  02/28/23 192 lb (87.1 kg)    General: Well developed, well nourished, NAD Lungs:Clear to ausculation bilaterally. No wheezes, rales, or rhonchi. Breathing is unlabored. Cardiovascular: RRR with S1 S2. No murmurs Extremities: No edema.  Neuro: Alert and oriented. No focal deficits. No facial asymmetry. MAE spontaneously. Psych: Responds to questions appropriately with normal affect.    ASSESSMENT:    Paroxsymal atrial fibrillation: s/p AF ablation and LAAO closure with Watchman. Patient can stop Eliquis and will start Plavix 75mg  daily through 08/17/2023. Once this is complete, would recommend adding ASA 81mg  daily given mild non-obstructive CAD. No need for dental SBE given full upper and lower dentures. CT instructions reviewed with understanding. Obtain BMET today. Follow up 07/2023.   Medication Adjustments/Labs and Tests Ordered: Current medicines are reviewed at length with the patient today.  Concerns regarding medicines are outlined above.  Orders Placed This Encounter  Procedures   Basic metabolic panel   Meds ordered this encounter  Medications   clopidogrel (PLAVIX) 75 MG tablet    Sig: Take 1 tablet (75 mg total) by mouth daily.    Dispense:  90 tablet    Refill:  3    Patient Instructions  Medication Instructions:  Stop Eliquis  Starting tomorrow take Clopidogrel (Plavix) 75 mg daily   *If you need a refill on your cardiac medications before your next appointment, please call your pharmacy*   Lab Work: BMET- Today   If you have labs (blood work) drawn today and your tests are completely normal, you will receive your results only by: MyChart Message (if you have MyChart) OR A paper copy in the mail If you have any lab test that is abnormal or we need to change your treatment, we will call you to review the results.   Testing/Procedures: None  ordered    Follow-Up: Follow up as needed   Other Instructions     Signed, Georgie Chard, NP  03/28/2023 12:14 PM    [facility]

## 2023-03-29 LAB — BASIC METABOLIC PANEL
BUN/Creatinine Ratio: 13 (ref 10–24)
BUN: 14 mg/dL (ref 8–27)
CO2: 25 mmol/L (ref 20–29)
Calcium: 9 mg/dL (ref 8.6–10.2)
Chloride: 104 mmol/L (ref 96–106)
Creatinine, Ser: 1.1 mg/dL (ref 0.76–1.27)
Glucose: 70 mg/dL (ref 70–99)
Potassium: 4.4 mmol/L (ref 3.5–5.2)
Sodium: 140 mmol/L (ref 134–144)
eGFR: 70 mL/min/{1.73_m2} (ref >59)

## 2023-03-31 ENCOUNTER — Telehealth: Payer: Self-pay | Admitting: Cardiology

## 2023-03-31 NOTE — Telephone Encounter (Signed)
Patient states he started experiencing some dizziness and feels like he has a headache starting to come on today. He states sometimes when he walks he goes off to one side. He states he has not changed anything other than starting Plavix a couple days ago and was wondering if this could be side effects of starting this medication.  Patient has not been checking his BP at home, but was able to check while on the phone. BP 140/84. Instructed patient to check his blood pressure when he has episodes of dizziness and to record those along with his heart rate. Advised on changing positions slowly.   Symptoms could be from Plavix and may take time to adjust to. Will forward message to Georgie Chard, NP to see if she has any other recommendations.

## 2023-03-31 NOTE — Telephone Encounter (Signed)
Pt c/o medication issue:  1. Name of Medication:  clopidogrel (PLAVIX) 75 MG tablet   2. How are you currently taking this medication (dosage and times per day)?   Take 1 tablet (75 mg total) by mouth daily.    3. Are you having a reaction (difficulty breathing--STAT)?   4. What is your medication issue? Patient states he feels dizzy, tired and has a slight headache.  He wants to know if this is something normal with just starting to take this medication.

## 2023-04-01 ENCOUNTER — Ambulatory Visit
Admission: RE | Admit: 2023-04-01 | Discharge: 2023-04-01 | Disposition: A | Discharge status: Home / Self Care | Payer: Medicare Other | Source: Ambulatory Visit | Attending: Nurse Practitioner | Admitting: Nurse Practitioner

## 2023-04-01 VITALS — BP 120/69 | HR 57 | Temp 98.6°F | Resp 16

## 2023-04-01 DIAGNOSIS — J320 Chronic maxillary sinusitis: Secondary | ICD-10-CM | POA: Diagnosis not present

## 2023-04-01 MED ORDER — DOXYCYCLINE HYCLATE 100 MG PO TABS: 100.0000 mg | ORAL_TABLET | Freq: Two times a day (BID) | ORAL | 0 refills | Status: AC

## 2023-04-01 NOTE — ED Triage Notes (Signed)
Pt reports he has pressure around his eyes, dizziness, and runny nose x 2 days.

## 2023-04-01 NOTE — Discharge Instructions (Addendum)
We are covering you for a sinus infection. Please start doxycycline 100 mg twice daily for 7 days. Stay out of the sun while on this medication.  You may take over-the-counter medication such as Mucinex and Flonase along with allergy medicine, Zyrtec, Claritin, or Allegra. Use nasal saline and sinus rinses. If your symptoms or not improving or if anything worsens please return for reevaluation.

## 2023-04-01 NOTE — ED Provider Notes (Signed)
[facility]    CSN: [id number] Arrival date & time: 04/01/23  1732      History   Chief Complaint No chief complaint on file.   HPI Steven Glover is a 72 y.o. male.   The history is provided by the patient.   Patient presents for complaints of pressure around his eyes, dizziness, nasal congestion, sinus pressure and runny nose.  Patient denies fever, chills, headache, ear pain, cough, chest pain, abdominal pain, nausea, vomiting, or diarrhea.  Patient reports he does have a history of underlying seasonal allergies.  Denies any obvious known sick contacts.  Reports he has not taken any medication for his symptoms. He has a history of rheumatoid arthritis and is on immune modulating medication including methotrexate and so often gets recurrent sinus infections. He denies any recent antibiotics in the past 90 days. Denies any known sick contacts.   Past Medical History:  Diagnosis Date   Acid reflux disease    Allergy    Asthma    as child   Atrial fibrillation (HCC)    BMI between 19-24,adult JUL 2012 191.8 LBS   Collagen vascular disease (HCC)    GERD (gastroesophageal reflux disease)    Internal hemorrhoids with other complication    TCS DEC 2013   Presence of Watchman left atrial appendage closure device 02/17/2023   31mm Watchman FLX Pro placed by Dr. 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   Aortic atherosclerosis (HCC) 08/30/2022   Hiatal hernia 03/01/2022   Abnormal CT scan, gastrointestinal tract 03/01/2022   Myalgia due to statin 06/12/2021   Hx of  colonic polyps    Thrombocytopenia (HCC) 06/23/2016   BPH (benign prostatic hyperplasia) 01/28/2016   Dyslipidemia (high LDL; low HDL) 01/28/2016   Internal hemorrhoids with complication 12/14/2012   GERD (gastroesophageal reflux disease) 04/21/2011   Colon cancer screening 04/21/2011   Dysphagia 12/30/2010    Past Surgical History:  Procedure Laterality Date   ATRIAL FIBRILLATION ABLATION N/A 12/21/2022   Procedure: ATRIAL FIBRILLATION ABLATION;  Surgeon: Lanier Prude, MD;  [facility];  Service: Cardiovascular;  Laterality: N/A;   COLONOSCOPY  2009 NUR [facility]   MULTIPLE SIMPLE ADENOMAS   COLONOSCOPY N/A 05/02/2017   Dr. Darrick Penna: 4 simple adenomas removed, diverticulosis.  Next colonoscopy in 3 years.   COLONOSCOPY W/ POLYPECTOMY  2010 NUR   COLONOSCOPY WITH PROPOFOL  05/15/2012   SLF:Two sessile polyps ranging between 3-89mm in size were found in the ascending colon and rectum; multiple biopsies were performed/Mild diverticulosis was noted in the sigmoid colon/The colon mucosa was otherwise normal/ Moderate sized internal hemorrhoids. TCS 04/2017.   COLONOSCOPY WITH PROPOFOL N/A 04/29/2020   Procedure: COLONOSCOPY WITH PROPOFOL;  Surgeon: Lanelle Bal, DO;  [facility];  Service: Endoscopy;  Laterality: N/A;  7:30am   ESOPHAGOGASTRODUODENOSCOPY  01/01/2011 [facility] CHRONIC GASTRITIS   SLF: Hiatal Hernia/mild gastritis/stricture in the distal esophagus   FOOT SURGERY Left    LEFT ATRIAL APPENDAGE OCCLUSION N/A 02/17/2023   Procedure: LEFT ATRIAL APPENDAGE OCCLUSION;  Surgeon: Lanier Prude, MD;  [facility];  Service: Cardiovascular;  Laterality: N/A;   POLYPECTOMY  05/15/2012   SIMPLE ADENOMA(2)   POLYPECTOMY  04/29/2020   Procedure: POLYPECTOMY;  Surgeon: Lanelle Bal, DO;  [facility];  Service: Endoscopy;;   SAVORY DILATION  01/01/2011   Procedure: SAVORY DILATION;  Surgeon: Arlyce Harman, MD;  Location: [facility];  Service: Endoscopy;  Laterality: N/A;   SHOULDER OPEN ROTATOR CUFF REPAIR  2005   TEE WITHOUT CARDIOVERSION N/A 02/17/2023   Procedure: TRANSESOPHAGEAL ECHOCARDIOGRAM;  Surgeon: Lanier Prude, MD;  Location: [facility] INVASIVE CV LAB;  Service: Cardiovascular;  Laterality: N/A;   WRIST FUSION         Home Medications    Prior to Admission medications   Medication Sig Start Date End Date Taking? Authorizing Provider  doxycycline (VIBRA-TABS) 100 MG tablet Take 1 tablet (100 mg total) by mouth 2 (two) times daily for 7 days. 04/01/23 04/08/23 Yes Leath-Warren, Sadie Haber, NP  amiodarone (PACERONE) 200 MG tablet Take 1 tablet (200 mg total) by mouth daily. 10/20/22   Eustace Pen, PA-C  cetirizine (ZYRTEC) 10 MG tablet Take 10 mg by mouth daily as needed for allergies.    [provider]  clopidogrel (PLAVIX) 75 MG tablet Take 1 tablet (75 mg total) by mouth daily. 03/28/23   Georgie Chard D, NP  ezetimibe (ZETIA) 10 MG tablet Take 1 tablet by mouth once daily 02/22/23   Babs Sciara, MD  folic acid (FOLVITE) 1 MG tablet Take 1 mg by mouth daily.      [provider]  Golimumab (SIMPONI ARIA IV) Inject into the vein. Every 8 weeks    [provider]  methotrexate 2.5 MG tablet Take 17.5 mg by mouth every Monday. 7 tablets-Monday    [provider]  pantoprazole (PROTONIX) 40 MG tablet Take 1 tablet (40 mg total) by mouth 2 (two) times daily. 06/30/22 06/30/23  Lanelle Bal, DO    Family History Family History  Problem Relation Age of Onset   Heart disease Mother        Leaky Heart Valve   Diabetes Father    Cancer Sister        breast   Cancer Brother        "intestines"   Congestive Heart Failure Brother    Crohn's disease Daughter    Cancer Maternal Grandmother    Colon cancer Neg Hx    Colon polyps Neg Hx     Social History Social History   Tobacco Use   Smoking status: Never    Passive exposure: Never   Smokeless  tobacco: Never   Tobacco comments:    Never smoked 01/18/23  Vaping Use   Vaping status: Never Used  Substance Use Topics   Alcohol use: No   Drug use: No     Allergies   Cefprozil and Statins   Review of Systems Review of Systems Per HPI  Physical Exam Triage Vital Signs ED Triage Vitals  Encounter Vitals Group     BP      Systolic BP Percentile      Diastolic BP Percentile      Pulse      Resp      Temp      Temp src      SpO2      Weight      Height      Head Circumference  Peak Flow      Pain Score      Pain Loc      Pain Education      Exclude from Growth Chart    No data found.  Updated Vital Signs BP 120/69 (BP Location: Right Arm)   Pulse (!) 57   Temp 98.6 F (37 C) (Oral)   Resp 16   SpO2 98%   Visual Acuity Right Eye Distance:   Left Eye Distance:   Bilateral Distance:    Right Eye Near:   Left Eye Near:    Bilateral Near:     Physical Exam Vitals and nursing note reviewed.  Constitutional:      General: He is not in acute distress.    Appearance: Normal appearance.  HENT:     Head: Normocephalic.     Right Ear: Tympanic membrane, ear canal and external ear normal.     Left Ear: Ear canal and external ear normal.     Nose: Congestion present.     Right Turbinates: Enlarged and swollen.     Left Turbinates: Enlarged and swollen.     Right Sinus: Maxillary sinus tenderness present.     Left Sinus: Maxillary sinus tenderness present.     Mouth/Throat:     Lips: Pink.     Mouth: Mucous membranes are moist.     Pharynx: Uvula midline. Posterior oropharyngeal erythema and postnasal drip present. No pharyngeal swelling, oropharyngeal exudate or uvula swelling.     Comments: Cobblestoning present to posterior oropharynx  Eyes:     Extraocular Movements: Extraocular movements intact.     Conjunctiva/sclera: Conjunctivae normal.     Pupils: Pupils are equal, round, and reactive to light.  Cardiovascular:     Rate and Rhythm:  Normal rate and regular rhythm.     Pulses: Normal pulses.     Heart sounds: Normal heart sounds.  Pulmonary:     Effort: Pulmonary effort is normal. No respiratory distress.     Breath sounds: Normal breath sounds. No stridor. No wheezing, rhonchi or rales.  Abdominal:     General: Bowel sounds are normal.     Palpations: Abdomen is soft.     Tenderness: There is no abdominal tenderness.  Musculoskeletal:     Cervical back: Normal range of motion.  Lymphadenopathy:     Cervical: No cervical adenopathy.  Skin:    General: Skin is warm and dry.  Neurological:     General: No focal deficit present.     Mental Status: He is alert and oriented to person, place, and time.  Psychiatric:        Mood and Affect: Mood normal.        Behavior: Behavior normal.      UC Treatments / Results  Labs (all labs ordered are listed, but only abnormal results are displayed) Labs Reviewed - No data to display  EKG   Radiology No results found.  Procedures Procedures (including critical care time)  Medications Ordered in UC Medications - No data to display  Initial Impression / Assessment and Plan / UC Course  I have reviewed the triage vital signs and the nursing notes.  Pertinent labs & imaging results that were available during my care of the patient were reviewed by me and considered in my medical decision making (see chart for details).  Patient is well-appearing, afebrile, nontoxic, nontachycardic. Given his history of recurrent sinus infections and current symptoms of dizziness with maxillary sinus tenderness, will treat  with doxycycline 100 mg twice a day for 7 days.  Patient will start use of Flonase that he has at home. Encouraged use of over-the-counter medication including antihistamine or Mucinex. He can use nasal saline and sinus rinses for additional symptom relief. If he has any worsening or changing symptoms he needs to be seen immediately. Strict return precautions given  to which he expressed understanding.    Final Clinical Impressions(s) / UC Diagnoses   Final diagnoses:  Maxillary sinusitis, unspecified chronicity     Discharge Instructions      We are covering you for a sinus infection. Please start doxycycline 100 mg twice daily for 7 days. Stay out of the sun while on this medication.  You may take over-the-counter medication such as Mucinex and Flonase along with allergy medicine, Zyrtec, Claritin, or Allegra. Use nasal saline and sinus rinses. If your symptoms or not improving or if anything worsens please return for reevaluation.      ED Prescriptions     Medication Sig Dispense Auth. Provider   doxycycline (VIBRA-TABS) 100 MG tablet Take 1 tablet (100 mg total) by mouth 2 (two) times daily for 7 days. 14 tablet Leath-Warren, Sadie Haber, NP      PDMP not reviewed this encounter.   Abran Cantor, NP 04/01/23 [phone]

## 2023-04-01 NOTE — Telephone Encounter (Signed)
I agree that he may just need a little adjustment time with Plavix but that is not a typical side effect that is reported. His labs look great from Monday and his BP seems very controlled as well. If he continues to have this or symptoms do not improve by next week, please have him reach back out and maybe we could look at making a change from that standpoint.   Thank you- Noreene Larsson

## 2023-04-12 ENCOUNTER — Telehealth: Payer: Self-pay

## 2023-04-12 NOTE — Telephone Encounter (Signed)
**Note De-Identified Rivaldo Hineman Obfuscation** I called Medicare part A and B at [phone] and was advised By Orland Mustard. that a PA is not required for the pts CPAP Titration. Call Reference #: Duwayne Heck 3:51 04/12/2023

## 2023-04-25 ENCOUNTER — Other Ambulatory Visit (HOSPITAL_COMMUNITY): Payer: Medicare Other

## 2023-04-25 ENCOUNTER — Other Ambulatory Visit (HOSPITAL_COMMUNITY): Payer: Self-pay | Admitting: *Deleted

## 2023-04-25 MED ORDER — AMIODARONE HCL 200 MG PO TABS: 200.0000 mg | ORAL_TABLET | Freq: Every day | ORAL | 3 refills | Status: DC

## 2023-04-27 ENCOUNTER — Ambulatory Visit: Payer: Medicare Other | Admitting: Cardiology

## 2023-04-28 ENCOUNTER — Telehealth (HOSPITAL_COMMUNITY): Payer: Self-pay | Admitting: *Deleted

## 2023-04-28 NOTE — Telephone Encounter (Signed)
Reaching out to patient to offer assistance regarding upcoming cardiac imaging study; pt verbalizes understanding of appt date/time, parking situation and where to check in, pre-test NPO status and medications ordered, and verified current allergies; name and call back number provided for further questions should they arise Hayley Sharpe RN Navigator Cardiac Imaging [facility] [phone] office [phone] cell  

## 2023-04-29 ENCOUNTER — Ambulatory Visit (HOSPITAL_COMMUNITY)
Admission: RE | Admit: 2023-04-29 | Discharge: 2023-04-29 | Disposition: A | Discharge status: Home / Self Care | Payer: Medicare Other | Source: Ambulatory Visit | Attending: Internal Medicine | Admitting: Internal Medicine

## 2023-04-29 DIAGNOSIS — Z95818 Presence of other cardiac implants and grafts: Secondary | ICD-10-CM | POA: Insufficient documentation

## 2023-04-29 DIAGNOSIS — I480 Paroxysmal atrial fibrillation: Secondary | ICD-10-CM | POA: Insufficient documentation

## 2023-04-29 MED ORDER — IOHEXOL 350 MG/ML SOLN
100.0000 mL | Freq: Once | INTRAVENOUS | Status: AC | PRN
  Administered 2023-04-29: 100 mL via INTRAVENOUS

## 2023-05-20 ENCOUNTER — Other Ambulatory Visit: Payer: Self-pay | Admitting: Family Medicine

## 2023-05-27 ENCOUNTER — Encounter (HOSPITAL_BASED_OUTPATIENT_CLINIC_OR_DEPARTMENT_OTHER): Payer: Medicare Other | Admitting: Cardiovascular Disease

## 2023-06-05 ENCOUNTER — Encounter (HOSPITAL_BASED_OUTPATIENT_CLINIC_OR_DEPARTMENT_OTHER): Payer: Medicare Other | Admitting: Cardiovascular Disease

## 2023-06-08 ENCOUNTER — Other Ambulatory Visit: Payer: Self-pay | Admitting: Family Medicine

## 2023-06-08 MED ORDER — EZETIMIBE 10 MG PO TABS: 10.0000 mg | ORAL_TABLET | Freq: Every day | ORAL | 0 refills | Status: DC

## 2023-06-08 NOTE — Telephone Encounter (Signed)
 Copied from CRM [id number]. Topic: Clinical - Medication Refill >> Jun 08, 2023  9:04 AM Jennings Mohr D wrote: Most Recent Primary Care Visit:  Provider: Francenia Ingle  Department: [facility]  Visit Type: NEW PATIENT  Date: 03/02/2023  Medication: ezetimibe  (ZETIA ) 10 MG tablet  Has the patient contacted their pharmacy? Yes (Agent: If no, request that the patient contact the pharmacy for the refill. If patient does not wish to contact the pharmacy document the reason why and proceed with request.) (Agent: If yes, when and what did the pharmacy advise?)  Is this the correct pharmacy for this prescription? Yes If no, delete pharmacy and type the correct one.  This is the patient's preferred pharmacy:  [facility] [address] [place] - [address] [place] [address] [place] [address] Phone: [phone] Fax: [phone]   Has the prescription been filled recently? No  Is the patient out of the medication? No  Has the patient been seen for an appointment in the last year OR does the patient have an upcoming appointment? Yes  Can we respond through MyChart? Yes  Agent: Please be advised that Rx refills may take up to 3 business days. We ask that you follow-up with your pharmacy. >> Jun 08, 2023  9:07 AM Jennings Mohr D wrote: Patient stated that the medication ezetimibe  (ZETIA ) 10 MG tablet  was prescribed by previous PCP Dr.Scott Luking. Patient would like to be contacted if prescription is unable to refill .

## 2023-06-14 ENCOUNTER — Ambulatory Visit (HOSPITAL_BASED_OUTPATIENT_CLINIC_OR_DEPARTMENT_OTHER): Payer: Medicare Other | Attending: Cardiovascular Disease | Admitting: Cardiovascular Disease

## 2023-06-14 VITALS — Ht 75.0 in | Wt 200.0 lb

## 2023-06-14 DIAGNOSIS — G4733 Obstructive sleep apnea (adult) (pediatric): Secondary | ICD-10-CM

## 2023-07-05 ENCOUNTER — Ambulatory Visit: Payer: Medicare Other | Admitting: Gastroenterology

## 2023-07-07 ENCOUNTER — Ambulatory Visit: Payer: Medicare Other | Admitting: Gastroenterology

## 2023-07-07 ENCOUNTER — Encounter (HOSPITAL_BASED_OUTPATIENT_CLINIC_OR_DEPARTMENT_OTHER): Payer: Self-pay | Admitting: Cardiovascular Disease

## 2023-07-07 ENCOUNTER — Encounter: Payer: Self-pay | Admitting: Gastroenterology

## 2023-07-07 VITALS — BP 138/73 | HR 56 | Temp 98.7°F | Ht 75.0 in | Wt 198.8 lb

## 2023-07-07 DIAGNOSIS — K219 Gastro-esophageal reflux disease without esophagitis: Secondary | ICD-10-CM | POA: Diagnosis not present

## 2023-07-07 DIAGNOSIS — M793 Panniculitis, unspecified: Secondary | ICD-10-CM

## 2023-07-07 DIAGNOSIS — Z860100 Personal history of colon polyps, unspecified: Secondary | ICD-10-CM | POA: Diagnosis not present

## 2023-07-07 MED ORDER — PANTOPRAZOLE SODIUM 40 MG PO TBEC: 40.0000 mg | DELAYED_RELEASE_TABLET | Freq: Two times a day (BID) | ORAL | 3 refills | Status: DC

## 2023-07-07 NOTE — Patient Instructions (Signed)
You can try and wean down to taking pantoprazole just once a day, 30 minutes before eating. If your reflux flares up, you can resume twice a day.  We will see you in 6 months!  Your next colonoscopy is due in 2026!  Have a wonderful birthday upcoming!  It was a pleasure to see you today. I want to create trusting relationships with patients and provide genuine, compassionate, and quality care. I truly value your feedback, so please be on the lookout for a survey regarding your visit with me today. I appreciate your time in completing this!         Gelene Mink, PhD, ANP-BC [facility]

## 2023-07-07 NOTE — Progress Notes (Signed)
Gastroenterology Office Note     Primary Care Physician:  Alveria Apley, NP  Primary Gastroenterologist: Dr. Marletta Lor    Chief Complaint   Chief Complaint  Patient presents with   Follow-up    Follow up after abd CT scan. Pt is feeling good     History of Present Illness   [name] Reason is a 73 y.o. male presenting today with a history of GERD, large sliding hiatal hernia, panniculitis with last CT in Feb 2024, returning for 6 month follow-up.  Pantoprazole BID for GERD. No exacerbations. No dysphagia. No abdominal pain. No rectal bleeding. Denies any constipation, diarrhea. No changes in bowel habits. Has no concerns today.      Colonoscopy Dec 2021: two 3-5 mm polyps in transverse and ascending colon, one 2 mm polyp in transverse, surveillance 5 years. Path: tubular adenomas.   EGD Aug 2012: mild chronic gastritis, stricture distal esophagus, hiatal hernia  Past Medical History:  Diagnosis Date   Acid reflux disease    Allergy    Asthma    as child   Atrial fibrillation (HCC)    BMI between 19-24,adult JUL 2012 191.8 LBS   Collagen vascular disease (HCC)    GERD (gastroesophageal reflux disease)    Internal hemorrhoids with other complication    TCS DEC 2013   Presence of Watchman left atrial appendage closure device 02/17/2023   31mm Watchman FLX Pro placed by Dr. Lalla Brothers   Pulmonary nodule 2013   [facility] - Repeat 08/2012   Rheumatoid arthritis(714.0)    Skin cancer     Past Surgical History:  Procedure Laterality Date   ATRIAL FIBRILLATION ABLATION N/A 12/21/2022   Procedure: ATRIAL FIBRILLATION ABLATION;  Surgeon: Lanier Prude, MD;  [facility];  Service: Cardiovascular;  Laterality: N/A;   COLONOSCOPY  2009 NUR [facility]   MULTIPLE SIMPLE ADENOMAS   COLONOSCOPY N/A 05/02/2017   Dr. Darrick Penna: 4 simple adenomas removed, diverticulosis.  Next colonoscopy in 3 years.   COLONOSCOPY W/ POLYPECTOMY  2010 NUR   COLONOSCOPY WITH  PROPOFOL  05/15/2012   SLF:Two sessile polyps ranging between 3-16mm in size were found in the ascending colon and rectum; multiple biopsies were performed/Mild diverticulosis was noted in the sigmoid colon/The colon mucosa was otherwise normal/ Moderate sized internal hemorrhoids. TCS 04/2017.   COLONOSCOPY WITH PROPOFOL N/A 04/29/2020   Procedure: COLONOSCOPY WITH PROPOFOL;  Surgeon: Lanelle Bal, DO;  [facility];  Service: Endoscopy;  Laterality: N/A;  7:30am   ESOPHAGOGASTRODUODENOSCOPY  01/01/2011 [id number] CHRONIC GASTRITIS   SLF: Hiatal Hernia/mild gastritis/stricture in the distal esophagus   FOOT SURGERY Left    LEFT ATRIAL APPENDAGE OCCLUSION N/A 02/17/2023   Procedure: LEFT ATRIAL APPENDAGE OCCLUSION;  Surgeon: Lanier Prude, MD;  [facility];  Service: Cardiovascular;  Laterality: N/A;   POLYPECTOMY  05/15/2012   SIMPLE ADENOMA(2)   POLYPECTOMY  04/29/2020   Procedure: POLYPECTOMY;  Surgeon: Lanelle Bal, DO;  [facility];  Service: Endoscopy;;   SAVORY DILATION  01/01/2011   Procedure: SAVORY DILATION;  Surgeon: Arlyce Harman, MD;  [facility];  Service: Endoscopy;  Laterality: N/A;   SHOULDER OPEN ROTATOR CUFF REPAIR  2005   TEE WITHOUT CARDIOVERSION N/A 02/17/2023   Procedure: TRANSESOPHAGEAL ECHOCARDIOGRAM;  Surgeon: Lanier Prude, MD;  Location: [facility] INVASIVE CV LAB;  Service: Cardiovascular;  Laterality: N/A;   WRIST FUSION  Current Outpatient Medications  Medication Sig Dispense Refill   amiodarone (PACERONE) 200 MG tablet Take 1 tablet (200 mg total) by mouth daily. 30 tablet 3   cetirizine (ZYRTEC) 10 MG tablet Take 10 mg by mouth daily as needed for allergies.     clopidogrel (PLAVIX) 75 MG tablet Take 1 tablet (75 mg total) by mouth daily. 90 tablet 3   ezetimibe (ZETIA) 10 MG tablet Take 1 tablet (10 mg total) by mouth daily. 90 tablet 0   folic acid (FOLVITE) 1 MG tablet Take 1 mg by mouth  daily.       Golimumab (SIMPONI ARIA IV) Inject into the vein. Every 8 weeks     methotrexate 2.5 MG tablet Take 17.5 mg by mouth every Monday. 7 tablets-Monday     pantoprazole (PROTONIX) 40 MG tablet Take 1 tablet (40 mg total) by mouth 2 (two) times daily. 180 tablet 3   No current facility-administered medications for this visit.    Allergies as of 07/07/2023 - Review Complete 07/07/2023  Allergen Reaction Noted   Cefprozil  02/21/2015   Statins  11/30/2018    Family History  Problem Relation Age of Onset   Heart disease Mother        Leaky Heart Valve   Diabetes Father    Cancer Sister        breast   Cancer Brother        "intestines"   Congestive Heart Failure Brother    Crohn's disease Daughter    Cancer Maternal Grandmother    Colon cancer Neg Hx    Colon polyps Neg Hx     Social History   Socioeconomic History   Marital status: Married    Spouse name: Not on file   Number of children: 2   Years of education: Not on file   Highest education level: High school graduate  Occupational History   Not on file  Tobacco Use   Smoking status: Never    Passive exposure: Never   Smokeless tobacco: Never   Tobacco comments:    Never smoked 01/18/23  Vaping Use   Vaping status: Never Used  Substance and Sexual Activity   Alcohol use: No   Drug use: No   Sexual activity: Not Currently    Birth control/protection: None  Other Topics Concern   Not on file  Social History Narrative   MARRIED-Daughter, [name], nurse ([employer]), 2 CHILDREN   Social Drivers of Health   Financial Resource Strain: Low Risk  (03/02/2023)   Overall Financial Resource Strain (CARDIA)    Difficulty of Paying Living Expenses: Not hard at all  Food Insecurity: No Food Insecurity (02/17/2023)   Hunger Vital Sign    Worried About Running Out of Food in the Last Year: Never true    Ran Out of Food in the Last Year: Never true  Transportation Needs: No Transportation Needs (02/17/2023)    PRAPARE - Administrator, Civil Service (Medical): No    Lack of Transportation (Non-Medical): No  Physical Activity: Sufficiently Active (03/02/2023)   Exercise Vital Sign    Days of Exercise per Week: 7 days    Minutes of Exercise per Session: 150+ min  Stress: No Stress Concern Present (03/02/2023)   [employer] of Occupational Health - Occupational Stress Questionnaire    Feeling of Stress : Not at all  Social Connections: Socially Integrated (03/02/2023)   Social Connection and Isolation Panel [NHANES]    Frequency of  Communication with Friends and Family: More than three times a week    Frequency of Social Gatherings with Friends and Family: Three times a week    Attends Religious Services: More than 4 times per year    Active Member of Clubs or Organizations: Yes    Attends Banker Meetings: More than 4 times per year    Marital Status: Married  Catering manager Violence: Not At Risk (02/17/2023)   Humiliation, Afraid, Rape, and Kick questionnaire    Fear of Current or Ex-Partner: No    Emotionally Abused: No    Physically Abused: No    Sexually Abused: No     Review of Systems   Gen: Denies any fever, chills, fatigue, weight loss, lack of appetite.  CV: Denies chest pain, heart palpitations, peripheral edema, syncope.  Resp: Denies shortness of breath at rest or with exertion. Denies wheezing or cough.  GI: Denies dysphagia or odynophagia. Denies jaundice, hematemesis, fecal incontinence. GU : Denies urinary burning, urinary frequency, urinary hesitancy MS: Denies joint pain, muscle weakness, cramps, or limitation of movement.  Derm: Denies rash, itching, dry skin Psych: Denies depression, anxiety, memory loss, and confusion Heme: Denies bruising, bleeding, and enlarged lymph nodes.   Physical Exam   BP 138/73   Pulse (!) 56   Temp 98.7 F (37.1 C)   Ht 6\' 3"  (1.905 m)   Wt 198 lb 12.8 oz (90.2 kg)   BMI 24.85 kg/m  General:   Alert  and oriented. Pleasant and cooperative. Well-nourished and well-developed.  Head:  Normocephalic and atraumatic. Eyes:  Without icterus Abdomen:  +BS, soft, non-tender and non-distended. No HSM noted. No guarding or rebound. No masses appreciated.  Rectal:  Deferred  Msk:  Symmetrical without gross deformities. Normal posture. Extremities:  Without edema. Neurologic:  Alert and  oriented x4;  grossly normal neurologically. Skin:  Intact without significant lesions or rashes. Psych:  Alert and cooperative. Normal mood and affect.   Assessment   Steven Glover is a 73 y.o. male presenting today with a history of  GERD, large sliding hiatal hernia, panniculitis with last CT in Feb 2024, returning for 6 month follow-up.  GERD: continue PPI BID but can trial once daily if tolerated. May not be able to tolerate this with known large hiatal hernia. No alarm signs/symptoms, no hx of Barrett's, last EGD in 2012.   Panniculitis: last CT Feb 2024. Asymptomatic. Reaching out to surgery regarding any further surveillance.   Hx of colon polyps: last in 2021. Surveillance in 2026.     PLAN    Continue BID PPI and wean down to once daily if tolerated. Refills provided 6 month follow-up Reaching out to Dr. Henreitta Leber regarding ?surveillance CT. Suspect will follow clinically.    Gelene Mink, PhD, ANP-BC [facility]

## 2023-07-07 NOTE — Procedures (Signed)
Patient Name: Steven Glover, Steven Glover Date: 06/14/2023 Gender: Male D.O.B: 08-Oct-1950 Age (years): 24 Referring Provider: Nicki Guadalajara MD, ABSM Height (inches): 75 Interpreting Physician: Nicki Guadalajara MD, ABSM Weight (lbs): 200 RPSGT: Ulyess Mort BMI: 25 MRN: [id number] Neck Size: 16.00  CLINICAL INFORMATION The patient is referred for a CPAP titration to treat sleep apnea.  Date of HST: 01/03/2023:  AHI 14.0/h, supine AHI 23.1/h  SLEEP STUDY TECHNIQUE As per the AASM Manual for the Scoring of Sleep and Associated Events v2.3 (April 2016) with a hypopnea requiring 4% desaturations.  The channels recorded and monitored were frontal, central and occipital EEG, electrooculogram (EOG), submentalis EMG (chin), nasal and oral airflow, thoracic and abdominal wall motion, anterior tibialis EMG, snore microphone, electrocardiogram, and pulse oximetry. Continuous positive airway pressure (CPAP) was initiated at the beginning of the study and titrated to treat sleep-disordered breathing.  MEDICATIONS amiodarone (PACERONE) 200 MG tablet cetirizine (ZYRTEC) 10 MG tablet clopidogrel (PLAVIX) 75 MG tablet ezetimibe (ZETIA) 10 MG tablet folic acid (FOLVITE) 1 MG tablet Golimumab (SIMPONI ARIA IV) methotrexate 2.5 MG tablet pantoprazole (PROTONIX) 40 MG tablet Medications self-administered by patient taken the night of the study : N/A  TECHNICIAN COMMENTS Comments added by technician: PATIENT WAS ORDERED AS A CPAP TITRATION Comments added by scorer: N/A  RESPIRATORY PARAMETERS Optimal PAP Pressure (cm): 13 AHI at Optimal Pressure (/hr): 0 Overall Minimal O2 (%): 91.0 Supine % at Optimal Pressure (%): 0 Minimal O2 at Optimal Pressure (%): 93.0   SLEEP ARCHITECTURE The study was initiated at 9:49:41 PM and ended at 4:04:43 AM.  Sleep onset time was 54.9 minutes and the sleep efficiency was 55.6%. The total sleep time was 208.5 minutes.  The patient spent 19.4% of the  night in stage N1 sleep, 71.5% in stage N2 sleep, 0.0% in stage N3 and 9.1% in REM.Stage REM latency was 117.0 minutes  Wake after sleep onset was 111.6. Alpha intrusion was absent. Supine sleep was 17.03%.  CARDIAC DATA The 2 lead EKG demonstrated sinus rhythm. The mean heart rate was 56.8 beats per minute. Other EKG findings include: None.  LEG MOVEMENT DATA The total Periodic Limb Movements of Sleep (PLMS) were 0. The PLMS index was 0.0. A PLMS index of <15 is considered normal in adults.  IMPRESSIONS - CPAP was initiated at 6 CM and was titrated to optimal PAP pressure at 13 cm of water. (AHI 0; RDI 2.9/h - Central sleep apnea was not noted during this titration (CAI = 1.2/h). - Significant oxygen desaturations were not observed during this titration (2 nadir 91.0%). - The patient snored with soft snoring volume during this titration study. - Rare isolated PVS's were observed during this study. - Clinically significant periodic limb movements were not noted during this study. Arousals associated with PLMs were significant.  DIAGNOSIS - Obstructive Sleep Apnea (G47.33)  RECOMMENDATIONS - Recommend an initial trial of CPAP Auto therapy with EPR of 3 at 12 - 17 cm H2O with heated humidification .A Large size Resmed Full Face AirFit F20 mask was used for the titration study.  - Efforts should be made to optimize nasal and oropharyngeal patency - Avoid alcohol, sedatives and other CNS depressants that may worsen sleep apnea and disrupt normal sleep architecture. - Sleep hygiene should be reviewed to assess factors that may improve sleep quality. - Weight management and regular exercise should be initiated or continued. - Recommend a download and sleep clinic evaluation after 4-6 weeks of therapy.  [Electronically signed] 07/07/2023  02:01 PM  Nicki Guadalajara MD, FACC,ABSM Diplomate, American Board of Sleep Medicine NPI: [id number]  [facility] PH: [phone]    FX: [phone] ACCREDITED BY THE AMERICAN ACADEMY OF SLEEP MEDICINE

## 2023-07-11 ENCOUNTER — Telehealth: Payer: Self-pay

## 2023-07-11 DIAGNOSIS — G4733 Obstructive sleep apnea (adult) (pediatric): Secondary | ICD-10-CM

## 2023-07-11 DIAGNOSIS — Z95818 Presence of other cardiac implants and grafts: Secondary | ICD-10-CM

## 2023-07-11 DIAGNOSIS — I2510 Atherosclerotic heart disease of native coronary artery without angina pectoris: Secondary | ICD-10-CM

## 2023-07-11 DIAGNOSIS — I480 Paroxysmal atrial fibrillation: Secondary | ICD-10-CM

## 2023-07-11 DIAGNOSIS — R000 Tachycardia, unspecified: Secondary | ICD-10-CM

## 2023-07-11 DIAGNOSIS — I10 Essential (primary) hypertension: Secondary | ICD-10-CM

## 2023-07-11 NOTE — Telephone Encounter (Signed)
Notified patient of CPAP Titration results and recommendations. All questions were answered and patient verbalized understanding.

## 2023-07-11 NOTE — Telephone Encounter (Signed)
-----   Message from Nicki Guadalajara sent at 07/07/2023  3:42 PM EST ----- Merry Proud, please notify the patient the results of the sleep study.  Initiate AutoPap therapy as prescribed schedule for follow-up sleep clinic evaluation in 6 to 8 weeks for compliance.

## 2023-08-15 ENCOUNTER — Ambulatory Visit: Payer: Medicare Other

## 2023-08-15 ENCOUNTER — Telehealth: Payer: Self-pay | Admitting: Physician Assistant

## 2023-08-15 NOTE — Telephone Encounter (Signed)
 Called to check in with patient, who had LAAO on 02/17/23. The patient reports doing well with no issues.  Plan to stop Plavix 75mg  daily. I do not feel strongly that he needs a baby aspirin.  The patient understands to call with questions or concerns.

## 2023-08-19 ENCOUNTER — Encounter: Payer: Self-pay | Admitting: Nurse Practitioner

## 2023-08-19 ENCOUNTER — Ambulatory Visit: Attending: Nurse Practitioner | Admitting: Nurse Practitioner

## 2023-08-19 VITALS — BP 118/62 | HR 63 | Ht 75.0 in | Wt 197.0 lb

## 2023-08-19 DIAGNOSIS — I2510 Atherosclerotic heart disease of native coronary artery without angina pectoris: Secondary | ICD-10-CM | POA: Diagnosis present

## 2023-08-19 DIAGNOSIS — M79605 Pain in left leg: Secondary | ICD-10-CM | POA: Diagnosis present

## 2023-08-19 DIAGNOSIS — E785 Hyperlipidemia, unspecified: Secondary | ICD-10-CM

## 2023-08-19 DIAGNOSIS — I480 Paroxysmal atrial fibrillation: Secondary | ICD-10-CM | POA: Diagnosis present

## 2023-08-19 DIAGNOSIS — R200 Anesthesia of skin: Secondary | ICD-10-CM

## 2023-08-19 DIAGNOSIS — M79604 Pain in right leg: Secondary | ICD-10-CM | POA: Diagnosis not present

## 2023-08-19 NOTE — Patient Instructions (Signed)
 Medication Instructions:  Your physician recommends that you continue on your current medications as directed. Please refer to the Current Medication list given to you today.  *If you need a refill on your cardiac medications before your next appointment, please call your pharmacy*  Lab Work: NONE ordered at this time of appointment    Testing/Procedures: NONE ordered at this time of appointment    Follow-Up: At [facility], you and your health needs are our priority.  As part of our continuing mission to provide you with exceptional heart care, our providers are all part of one team.  This team includes your primary Cardiologist (physician) and Advanced Practice Providers or APPs (Physician Assistants and Nurse Practitioners) who all work together to provide you with the care you need, when you need it.  Your next appointment:   6 month(s)  Provider:   Bernadene Person, NP        We recommend signing up for the patient portal called "MyChart".  Sign up information is provided on this After Visit Summary.  MyChart is used to connect with patients for Virtual Visits (Telemedicine).  Patients are able to view lab/test results, encounter notes, upcoming appointments, etc.  Non-urgent messages can be sent to your provider as well.   To learn more about what you can do with MyChart, go to [email or web address].   Other Instructions       1st Floor: - Lobby - Registration  - Pharmacy  - Lab - Cafe  2nd Floor: - PV Lab - Diagnostic Testing (echo, CT, nuclear med)  3rd Floor: - Vacant  4th Floor: - TCTS (cardiothoracic surgery) - AFib Clinic - Structural Heart Clinic - Vascular Surgery  - Vascular Ultrasound  5th Floor: - HeartCare Cardiology (general and EP) - Clinical Pharmacy for coumadin, hypertension, lipid, weight-loss medications, and med management appointments    Valet parking services will be available as well.

## 2023-08-19 NOTE — Progress Notes (Signed)
 Office Visit    Patient Name: Steven Glover Date of Encounter: 08/19/2023  Primary Care Provider:  Alveria Apley, NP Primary Cardiologist:  Dietrich Pates, MD  Chief Complaint    73 year old male with a history of mild nonobstructive CAD, paroxysmal atrial fibrillation s/p Watchman device, hyperlipidemia, hiatal hernia, GERD, and rheumatoid arthritis who presents for follow-up related to CAD and atrial fibrillation.   Past Medical History    Past Medical History:  Diagnosis Date   Acid reflux disease    Allergy    Asthma    as child   Atrial fibrillation (HCC)    BMI between 19-24,adult JUL 2012 191.8 LBS   Collagen vascular disease (HCC)    GERD (gastroesophageal reflux disease)    Internal hemorrhoids with other complication    TCS DEC 2013   Presence of Watchman left atrial appendage closure device 02/17/2023   31mm Watchman FLX Pro placed by Dr. Lalla Brothers   Pulmonary nodule 2013   [facility] - Repeat 08/2012   Rheumatoid arthritis(714.0)    Skin cancer    Past Surgical History:  Procedure Laterality Date   ATRIAL FIBRILLATION ABLATION N/A 12/21/2022   Procedure: ATRIAL FIBRILLATION ABLATION;  Surgeon: Lanier Prude, MD;  Location: [facility];  Service: Cardiovascular;  Laterality: N/A;   COLONOSCOPY  2009 NUR [facility]   MULTIPLE SIMPLE ADENOMAS   COLONOSCOPY N/A 05/02/2017   Dr. Darrick Penna: 4 simple adenomas removed, diverticulosis.  Next colonoscopy in 3 years.   COLONOSCOPY W/ POLYPECTOMY  2010 NUR   COLONOSCOPY WITH PROPOFOL  05/15/2012   SLF:Two sessile polyps ranging between 3-78mm in size were found in the ascending colon and rectum; multiple biopsies were performed/Mild diverticulosis was noted in the sigmoid colon/The colon mucosa was otherwise normal/ Moderate sized internal hemorrhoids. TCS 04/2017.   COLONOSCOPY WITH PROPOFOL N/A 04/29/2020   Procedure: COLONOSCOPY WITH PROPOFOL;  Surgeon: Lanelle Bal, DO;  Location: [facility];  Service:  Endoscopy;  Laterality: N/A;  7:30am   ESOPHAGOGASTRODUODENOSCOPY  01/01/2011 [id number]:[id number] CHRONIC GASTRITIS   SLF: Hiatal Hernia/mild gastritis/stricture in the distal esophagus   FOOT SURGERY Left    LEFT ATRIAL APPENDAGE OCCLUSION N/A 02/17/2023   Procedure: LEFT ATRIAL APPENDAGE OCCLUSION;  Surgeon: Lanier Prude, MD;  Location: [facility];  Service: Cardiovascular;  Laterality: N/A;   POLYPECTOMY  05/15/2012   SIMPLE ADENOMA(2)   POLYPECTOMY  04/29/2020   Procedure: POLYPECTOMY;  Surgeon: Lanelle Bal, DO;  Location: [facility];  Service: Endoscopy;;   SAVORY DILATION  01/01/2011   Procedure: SAVORY DILATION;  Surgeon: Arlyce Harman, MD;  Location: [facility];  Service: Endoscopy;  Laterality: N/A;   SHOULDER OPEN ROTATOR CUFF REPAIR  2005   TEE WITHOUT CARDIOVERSION N/A 02/17/2023   Procedure: TRANSESOPHAGEAL ECHOCARDIOGRAM;  Surgeon: Lanier Prude, MD;  Location: [facility] INVASIVE CV LAB;  Service: Cardiovascular;  Laterality: N/A;   WRIST FUSION      Allergies  Allergies  Allergen Reactions   Cefprozil     Nausea, felt bad   Statins     myalgias     Labs/Other Studies Reviewed    The following studies were reviewed today:  Cardiac Studies & Procedures   ______________________________________________________________________________________________     ECHOCARDIOGRAM  ECHOCARDIOGRAM COMPLETE 09/29/2022  Narrative ECHOCARDIOGRAM REPORT    Patient Name:   Steven Glover Date of Exam: 09/29/2022 Medical Rec #:  [id number]           Height:  75.0 in Accession #:    [id number]          Weight:       199.6 lb Date of Birth:  [birth year]            BSA:          2.193 m Patient Age:    72 years            BP:           127/72 mmHg Patient Gender: M                   HR:           70 bpm. [facility]  Procedure: 2D Echo, Cardiac Doppler and Color Doppler  Indications:    Ventricular Tachycardia I47.2  History:        Patient  has no prior history of Echocardiogram examinations. Arrythmias:Paroxysmal atrial fibrillation; Risk Factors:Dyslipidemia.  Sonographer:    Celesta Gentile RCS Referring Phys: [phone] Shiva Sahagian C Beverlee Wilmarth  IMPRESSIONS   1. Left ventricular ejection fraction, by estimation, is 60 to 65%. The left ventricle has normal function. The left ventricle has no regional wall motion abnormalities. Left ventricular diastolic parameters were normal. 2. Right ventricular systolic function is normal. The right ventricular size is normal. Tricuspid regurgitation signal is inadequate for assessing PA pressure. 3. The mitral valve is grossly normal. Trivial mitral valve regurgitation. 4. The aortic valve is tricuspid. Aortic valve regurgitation is trivial. Aortic valve sclerosis is present, with no evidence of aortic valve stenosis. Aortic valve mean gradient measures 7.0 mmHg. 5. The inferior vena cava is normal in size with greater than 50% respiratory variability, suggesting right atrial pressure of 3 mmHg.  Comparison(s): No prior Echocardiogram.  FINDINGS Left Ventricle: Left ventricular ejection fraction, by estimation, is 60 to 65%. The left ventricle has normal function. The left ventricle has no regional wall motion abnormalities. The left ventricular internal cavity size was normal in size. There is no left ventricular hypertrophy. Left ventricular diastolic parameters were normal.  Right Ventricle: The right ventricular size is normal. No increase in right ventricular wall thickness. Right ventricular systolic function is normal. Tricuspid regurgitation signal is inadequate for assessing PA pressure.  Left Atrium: Left atrial size was normal in size.  Right Atrium: Right atrial size was normal in size.  Pericardium: There is no evidence of pericardial effusion.  Mitral Valve: The mitral valve is grossly normal. Trivial mitral valve regurgitation.  Tricuspid Valve: The tricuspid valve is grossly normal.  Tricuspid valve regurgitation is trivial.  Aortic Valve: The aortic valve is tricuspid. There is mild aortic valve annular calcification. Aortic valve regurgitation is trivial. Aortic valve sclerosis is present, with no evidence of aortic valve stenosis. Aortic valve mean gradient measures 7.0 mmHg. Aortic valve peak gradient measures 16.0 mmHg. Aortic valve area, by VTI measures 1.77 cm.  Pulmonic Valve: The pulmonic valve was grossly normal. Pulmonic valve regurgitation is trivial.  Aorta: The aortic root is normal in size and structure.  Venous: The inferior vena cava is normal in size with greater than 50% respiratory variability, suggesting right atrial pressure of 3 mmHg.  IAS/Shunts: No atrial level shunt detected by color flow Doppler.   LEFT VENTRICLE PLAX 2D LVIDd:         4.70 cm   Diastology LVIDs:         3.10 cm   LV e' medial:    7.18 cm/s LV PW:  0.90 cm   LV E/e' medial:  11.1 LV IVS:        0.80 cm   LV e' lateral:   8.59 cm/s LVOT diam:     1.80 cm   LV E/e' lateral: 9.3 LV SV:         65 LV SV Index:   29 LVOT Area:     2.54 cm   RIGHT VENTRICLE RV S prime:     12.20 cm/s TAPSE (M-mode): 2.4 cm  LEFT ATRIUM             Index        RIGHT ATRIUM           Index LA diam:        3.10 cm 1.41 cm/m   RA Area:     17.20 cm LA Vol (A2C):   52.2 ml 23.81 ml/m  RA Volume:   47.50 ml  21.66 ml/m LA Vol (A4C):   62.3 ml 28.41 ml/m LA Biplane Vol: 56.7 ml 25.86 ml/m AORTIC VALVE AV Area (Vmax):    1.84 cm AV Area (Vmean):   2.01 cm AV Area (VTI):     1.77 cm AV Vmax:           200.00 cm/s AV Vmean:          118.000 cm/s AV VTI:            0.365 m AV Peak Grad:      16.0 mmHg AV Mean Grad:      7.0 mmHg LVOT Vmax:         145.00 cm/s LVOT Vmean:        93.100 cm/s LVOT VTI:          0.254 m LVOT/AV VTI ratio: 0.70  AORTA Ao Root diam: 3.50 cm  MITRAL VALVE MV Area (PHT): 4.21 cm    SHUNTS MV Decel Time: 180 msec    Systemic VTI:  0.25  m MV E velocity: 79.50 cm/s  Systemic Diam: 1.80 cm MV A velocity: 67.90 cm/s MV E/A ratio:  1.17  Nona Dell MD Electronically signed by Nona Dell MD Signature Date/Time: 09/29/2022/11:10:01 AM    Final   TEE  ECHO TEE 02/17/2023  Narrative TRANSESOPHOGEAL ECHO REPORT    Patient Name:   Steven Glover Date of Exam: 02/17/2023 Medical Rec #:  [id number]           Height:       75.0 in Accession #:    [id number]          Weight:       195.0 lb Date of Birth:  Feb 25, 1951            BSA:          2.171 m Patient Age:    72 years            BP:           118/57 mmHg Patient Gender: M                   HR:           58 bpm. Exam Location:  Inpatient  Procedure: Transesophageal Echo  Indications:    Watchman Procedure  History:        Patient has prior history of Echocardiogram examinations. Arrythmias:Atrial Fibrillation.  Sonographer:    Darlys Gales Referring Phys: [id number] Lanier Prude  PROCEDURE: The transesophogeal probe was passed without difficulty  through the esophogus of the patient. Sedation performed by different physician. The patient developed no complications during the procedure.  IMPRESSIONS   1. Prior to procedure- patent left atrial appendage. Mean landing zone diameter of 2. 23 mm. Over 16 mm of wall distance. Suitable for 31 mm Watchman FLX Pro. 3. A transeptal puncture was performed with no complications. 4. A 31 mm Watchman FLX Pro was placed. There is no peri-device leak. No significant mitral shoulder. Average compression ~22%. No thrombus. 5. Expected, Iatrogenic shunting is all left to right. 6. No pericardial effusion. 7. Left ventricular ejection fraction, by estimation, is 60 to 65%. The left ventricle has normal function. 8. Right ventricular systolic function is normal. The right ventricular size is normal. 9. Left atrial size was mildly dilated. No left atrial/left atrial appendage thrombus was detected. 10. The mitral  valve is normal in structure. Mild to moderate mitral valve regurgitation. No evidence of mitral stenosis. 11. The aortic valve is tricuspid. Aortic valve regurgitation is trivial. 12. Evidence of atrial level shunting detected by color flow Doppler.  FINDINGS Left Ventricle: Left ventricular ejection fraction, by estimation, is 60 to 65%. The left ventricle has normal function. The left ventricular internal cavity size was normal in size.  Right Ventricle: The right ventricular size is normal. No increase in right ventricular wall thickness. Right ventricular systolic function is normal.  Left Atrium: Left atrial size was mildly dilated. No left atrial/left atrial appendage thrombus was detected.  Right Atrium: Right atrial size was normal in size.  Pericardium: There is no evidence of pericardial effusion.  Mitral Valve: The mitral valve is normal in structure. Mild to moderate mitral valve regurgitation. No evidence of mitral valve stenosis.  Tricuspid Valve: The tricuspid valve is normal in structure. Tricuspid valve regurgitation is mild . No evidence of tricuspid stenosis.  Aortic Valve: The aortic valve is tricuspid. Aortic valve regurgitation is trivial.  Pulmonic Valve: The pulmonic valve was normal in structure. Pulmonic valve regurgitation is trivial. No evidence of pulmonic stenosis.  Aorta: The aortic root, ascending aorta, aortic arch and descending aorta are all structurally normal, with no evidence of dilitation or obstruction.  IAS/Shunts: Evidence of atrial level shunting detected by color flow Doppler.   AORTIC VALVE LVOT Vmax:   113.00 cm/s LVOT Vmean:  76.300 cm/s LVOT VTI:    0.286 m   SHUNTS Systemic VTI: 0.29 m  Riley Lam MD Electronically signed by Riley Lam MD Signature Date/Time: 02/17/2023/9:08:59 AM    Final    CT SCANS  CT CORONARY MORPH W/CTA COR W/SCORE 02/26/2022  Addendum 02/26/2022 12:41 PM ADDENDUM REPORT:  02/26/2022 12:38  EXAM: OVER-READ INTERPRETATION CARDIAC CT CHEST  The following report is an over-read performed by radiologist Dr. Gaylyn Rong of [facility] on 02/26/2022. This over-read does not include interpretation of cardiac or coronary anatomy or pathology. The coronary CTA interpretation by the cardiologist is attached.  COMPARISON:  02/07/2022  FINDINGS: Extracardiac Vascular: Unremarkable  Mediastinum: Moderate-sized hiatal hernia. Calcified mediastinal lymph nodes compatible with old granulomatous disease.  Lung: Stable scarring in the lingula and right middle lobe. Emphysema. Stable peripheral reticular interstitial accentuation.  Included Upper Abdomen: Unremarkable  Musculoskeletal: Mild thoracic spondylosis.  IMPRESSION: 1. Moderate-sized hiatal hernia. 2. Stable scarring in the lingula and right middle lobe. 3. Emphysema. 4. Stable peripheral reticular interstitial accentuation in the lungs, likely from chronic interstitial lung disease. 5. Calcified mediastinal lymph nodes compatible with old granulomatous disease.  Emphysema (ICD10-J43.9).   Electronically  Signed By: Gaylyn Rong M.D. On: 02/26/2022 12:38  Narrative CLINICAL DATA:  Chest pain  EXAM: Cardiac CTA  MEDICATIONS: Sub lingual nitro. 4mg  and lopressor 50mg   TECHNIQUE: The patient was scanned on a Siemens Force 192 slice scanner. Gantry rotation speed was 250 msecs. Collimation was .6 mm. A 100 kV prospective scan was triggered in the ascending thoracic aorta at 140 HU's Full mA was used between 35% and 75% of the R-R interval. Average HR during the scan was 58 bpm. The 3D data set was interpreted on a dedicated work station using MPR, MIP and VRT modes. A total of 80cc of contrast was used.  FINDINGS: Non-cardiac: See separate report from [facility]. No significant findings on limited lung and soft tissue windows.  Calcium Score: Noted on  LAD  Coronary Arteries: Right dominant with no anomalies  LM: Normal  LAD: 1-24% calcified plaque in ostial LAD and proximal vessel  IM: Large vessel normal  D1: Normal  D2: Normal  Circumflex: Normal  OM1: Normal  OM2: Normal  RCA: Normal  PDA: Normal  PLA: Normal  IMPRESSION: 1.  Normal ascending thoracic aorta 3.6 cm  2.  Calcium score 38.2 which is only 25 th percentile for age / sex  61.  CAD RADS 1 non obstrucitve CAD see description above  [facility]s  Electronically Signed: By: Charlton [facility] M.D. On: 02/26/2022 10:37     ______________________________________________________________________________________________     Recent Labs: 10/11/2022: ALT 21; TSH 4.219 01/18/2023: Hemoglobin 13.9; Platelets 134 03/28/2023: BUN 14; Creatinine, Ser 1.10; Potassium 4.4; Sodium 140  Recent Lipid Panel    Component Value Date/Time   CHOL 125 08/31/2022 0809   TRIG 159 (H) 08/31/2022 0809   HDL 31 (L) 08/31/2022 0809   CHOLHDL 4.0 08/31/2022 0809   CHOLHDL 5.7 03/28/2013 0739   VLDL 43 (H) 03/28/2013 0739   LDLCALC 67 08/31/2022 0809    History of Present Illness    73 year old male with the above past medical history including mild nonobstructive CAD, paroxysmal atrial fibrillation s/p Watchman device, hyperlipidemia, hiatal hernia, GERD, and rheumatoid arthritis.    [facility] was [facility] ED in September 2023 in the setting of sudden onset chest pressure that radiated to his arms and back.  CT of the chest showed mild calcifications, no dissection or PE.  Workup was overall unremarkable. Coronary CT angiogram in 02/2022 revealed coronary calcium score 38.2 (25th percentile), mild nonobstructive CAD.  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  During attempted ureteroscopy he had an elevated heart rate in the 150s. Follow-up with  cardiology was advised.  Cardiac monitor in 09/2022 revealed paroxysmal atrial fibrillation.  He was started on Eliquis and amiodarone.  Echocardiogram was normal.  ABIs in the setting of bilateral leg pain were normal.  He was referred to EP and underwent ablation for atrial fibrillation in 11/2022.  He underwent closure of left atrial with Watchman device on 02/17/2023 with plans to continue Eliquis for 45 days postprocedure followed by 6 months of Plavix therapy. He was last seen in the office on 03/28/2023 by structural heart NP was doing well.  Follow-up CT in 04/2023 showed well-seated Watchman device without leak or thrombus.     He presents today for follow-up accompanied by his wife. . Since his last visit he has done well from a cardiac standpoint.  He denies any  chest pain, palpitations, dyspnea, edema, PND, with apnea, weight gain.  He remains very active-he has been cutting down trees in his neighbor's yard.  Overall, he reports feeling well.     Home Medications    Current Outpatient Medications  Medication Sig Dispense Refill   amiodarone (PACERONE) 200 MG tablet Take 1 tablet (200 mg total) by mouth daily. 30 tablet 3   cetirizine (ZYRTEC) 10 MG tablet Take 10 mg by mouth daily as needed for allergies.     ezetimibe (ZETIA) 10 MG tablet Take 1 tablet (10 mg total) by mouth daily. 90 tablet 0   folic acid (FOLVITE) 1 MG tablet Take 1 mg by mouth daily.       Golimumab (SIMPONI ARIA IV) Inject into the vein. Every 8 weeks     methotrexate 2.5 MG tablet Take 17.5 mg by mouth every Monday. 7 tablets-Monday     pantoprazole (PROTONIX) 40 MG tablet Take 1 tablet (40 mg total) by mouth 2 (two) times daily. 180 tablet 3   No current facility-administered medications for this visit.     Review of Systems   He denies chest pain, palpitations, dyspnea, pnd, orthopnea, n, v, dizziness, syncope, edema, weight gain, or early satiety. All other systems reviewed and are otherwise negative except as  noted above.   Physical Exam    VS:  BP 118/62   Pulse 63   Ht 6\' 3"  (1.905 m)   Wt 197 lb (89.4 kg)   SpO2 97%   BMI 24.62 kg/m  GEN: Well nourished, well developed, in no acute distress. HEENT: normal. Neck: Supple, no JVD, carotid bruits, or masses. Cardiac: RRR, no murmurs, rubs, or gallops. No clubbing, cyanosis, edema.  Radials/DP/PT 2+ and equal bilaterally.  Respiratory:  Respirations regular and unlabored, clear to auscultation bilaterally. GI: Soft, nontender, nondistended, BS + x 4. MS: no deformity or atrophy. Skin: warm and dry, no rash. Neuro:  Strength and sensation are intact. Psych: Normal affect.  Accessory Clinical Findings    ECG personally reviewed by me today -    - no EKG in office today.    Lab Results  Component Value Date   WBC 4.9 01/18/2023   HGB 13.9 01/18/2023   HCT 41.2 01/18/2023   MCV 96.0 01/18/2023   PLT 134 (L) 01/18/2023   Lab Results  Component Value Date   CREATININE 1.10 03/28/2023   BUN 14 03/28/2023   NA 140 03/28/2023   K 4.4 03/28/2023   CL 104 03/28/2023   CO2 25 03/28/2023   Lab Results  Component Value Date   ALT 21 10/11/2022   AST 24 10/11/2022   ALKPHOS 66 10/11/2022   BILITOT 0.5 10/11/2022   Lab Results  Component Value Date   CHOL 125 08/31/2022   HDL 31 (L) 08/31/2022   LDLCALC 67 08/31/2022   TRIG 159 (H) 08/31/2022   CHOLHDL 4.0 08/31/2022    No results found for: "HGBA1C"  Assessment & Plan    1. Paroxysmal atrial fibrillation: Diagnosed in 09/2022. S/p ablation in 11/2022, s/p Watchman on 02/17/2023. Follow-up CT in 04/2023 showed well-seated Watchman device without leak or thrombus.  Maintaining SR on exam. Continue amiodarone.   2. CAD: Coronary CT angiogram in 02/2022 revealed coronary calcium score 38.2 (25th percentile), mild nonobstructive CAD. Stable with no anginal symptoms. No indication for ischemic evaluation. Continue Zetia.    3. Hyperlipidemia: LDL was 67 in 08/2022. Continue Zetia.     4. Bilateral leg pain/numbness tingling:  ABIs were unremarkable.  His symptoms have since resolved.   5. Disposition:  Follow-up in 6 months, sooner if needed. He and his wife are requesting to transition cardiologist's from Dr. Tenny Craw to Dr. Royann Shivers. I will reach out to both providers to see if they are agreeable to this change.       Joylene Grapes, NP 08/19/2023, 2:36 PM

## 2023-08-21 ENCOUNTER — Encounter: Payer: Self-pay | Admitting: Nurse Practitioner

## 2023-08-22 ENCOUNTER — Telehealth: Payer: Self-pay | Admitting: Internal Medicine

## 2023-08-22 MED ORDER — AMIODARONE HCL 200 MG PO TABS: 200.0000 mg | ORAL_TABLET | Freq: Every day | ORAL | 3 refills | Status: DC

## 2023-08-22 NOTE — Telephone Encounter (Signed)
 Pt's medication was sent to pt's pharmacy as requested. Confirmation received.

## 2023-08-22 NOTE — Telephone Encounter (Signed)
*  STAT* If patient is at the pharmacy, call can be transferred to refill team.   1. Which medications need to be refilled? (please list name of each medication and dose if known) amiodarone (PACERONE) 200 MG tablet   2. Which pharmacy/location (including street and city if local pharmacy) is medication to be sent to? [id number] - [place], [place] - [address] AT [address] [facility]    3. Do they need a 30 day or 90 day supply? 90  Only has 1 left for tomorrow

## 2023-08-29 ENCOUNTER — Ambulatory Visit: Payer: Medicare Other | Admitting: Nurse Practitioner

## 2023-08-31 ENCOUNTER — Ambulatory Visit (INDEPENDENT_AMBULATORY_CARE_PROVIDER_SITE_OTHER): Payer: Medicare Other | Admitting: Family Medicine

## 2023-08-31 VITALS — BP 116/82 | HR 50 | Temp 97.7°F | Ht 75.0 in | Wt 201.0 lb

## 2023-08-31 DIAGNOSIS — E785 Hyperlipidemia, unspecified: Secondary | ICD-10-CM | POA: Diagnosis not present

## 2023-08-31 LAB — COMPREHENSIVE METABOLIC PANEL WITH GFR
ALT: 29 U/L (ref 0–53)
AST: 28 U/L (ref 0–37)
Albumin: 4.2 g/dL (ref 3.5–5.2)
Alkaline Phosphatase: 76 U/L (ref 39–117)
BUN: 14 mg/dL (ref 6–23)
CO2: 28 meq/L (ref 19–32)
Calcium: 9.1 mg/dL (ref 8.4–10.5)
Chloride: 103 meq/L (ref 96–112)
Creatinine, Ser: 1.25 mg/dL (ref 0.40–1.50)
GFR: 56.52 mL/min — ABNORMAL LOW (ref >60.00)
Glucose, Bld: 79 mg/dL (ref 70–99)
Potassium: 4.5 meq/L (ref 3.5–5.1)
Sodium: 137 meq/L (ref 135–145)
Total Bilirubin: 0.7 mg/dL (ref 0.2–1.2)
Total Protein: 6.3 g/dL (ref 6.0–8.3)

## 2023-08-31 LAB — LIPID PANEL
Cholesterol: 137 mg/dL (ref 0–200)
HDL: 29.5 mg/dL — ABNORMAL LOW (ref >39.00)
LDL Cholesterol: 44 mg/dL (ref 0–99)
NonHDL: 107.37
Total CHOL/HDL Ratio: 5
Triglycerides: 316 mg/dL — ABNORMAL HIGH (ref 0.0–149.0)
VLDL: 63.2 mg/dL — ABNORMAL HIGH (ref 0.0–40.0)

## 2023-08-31 MED ORDER — EZETIMIBE 10 MG PO TABS: 10.0000 mg | ORAL_TABLET | Freq: Every day | ORAL | 1 refills | Status: DC

## 2023-08-31 NOTE — Assessment & Plan Note (Signed)
 Stable. Continue Ezetimibe 10mg  daily. Refilled medication. Ordered CMP to assess liver function and lipid panel.

## 2023-08-31 NOTE — Patient Instructions (Addendum)
-  It was great to see you. -Refilled Zetia 10mg  daily for cholesterol. -Ordered labs. Office will call with results and you will see them in MyChart. -Obtain Zoster vaccine at local pharmacy.  -Follow up in 6 months and needs Annual Wellness Visit completed via telehealth.

## 2023-08-31 NOTE — Progress Notes (Signed)
   Established Patient Office Visit   Subjective:  Patient ID: Steven Glover, male    DOB: [birth year]  Age: 73 y.o. MRN: [id number]  Chief Complaint  Patient presents with   Medical Management of Chronic Issues    HPI Hyperlipidemia: Chronic. Patient is taking Ezetimibe 10mg  daily. Can not tolerate statins. He has no complaints with taking medication. Effective. He is followed by cardiology, but reports previously primary care managed his Ezetimibe prescription.   He reports he recently seen rheumatology in March. He reports either his liver or kidney function was slightly off.   ROS See HPI above     Objective:   BP 116/82   Pulse (!) 50   Temp 97.7 F (36.5 C) (Oral)   Ht 6\' 3"  (1.905 m)   Wt 201 lb (91.2 kg)   SpO2 98%   BMI 25.12 kg/m    Physical Exam Vitals reviewed.  Constitutional:      General: He is not in acute distress.    Appearance: Normal appearance. He is not ill-appearing, toxic-appearing or diaphoretic.  HENT:     Head: Normocephalic and atraumatic.     Right Ear: Tympanic membrane, ear canal and external ear normal. There is no impacted cerumen.     Left Ear: Tympanic membrane, ear canal and external ear normal. There is no impacted cerumen.     Ears:     Comments: Wears hearing aid in both ears  Eyes:     General:        Right eye: No discharge.        Left eye: No discharge.     Conjunctiva/sclera: Conjunctivae normal.  Cardiovascular:     Rate and Rhythm: Normal rate and regular rhythm.     Heart sounds: Normal heart sounds. No murmur heard.    No friction rub. No gallop.  Pulmonary:     Effort: Pulmonary effort is normal. No respiratory distress.     Breath sounds: Normal breath sounds.  Musculoskeletal:        General: Normal range of motion.  Skin:    General: Skin is warm and dry.  Neurological:     General: No focal deficit present.     Mental Status: He is alert and oriented to person, place, and time. Mental status is at  baseline.  Psychiatric:        Mood and Affect: Mood normal.        Behavior: Behavior normal.        Thought Content: Thought content normal.        Judgment: Judgment normal.      Assessment & Plan:  Dyslipidemia (high LDL; low HDL) Assessment & Plan: Stable. Continue Ezetimibe 10mg  daily. Refilled medication. Ordered CMP to assess liver function and lipid panel.   Orders: -     Lipid panel -     Comprehensive metabolic panel with GFR -     Ezetimibe; Take 1 tablet (10 mg total) by mouth daily.  Dispense: 90 tablet; Refill: 1  1.Review health maintenance:  -Covid booster: Declined -Zoster vaccine: Will obtain at local pharmacy  -AWV: Needs visit Return in about 6 months (around 03/01/2024) for chronic management; AWV-telehealth .   Zandra Abts, NP

## 2023-09-01 ENCOUNTER — Encounter: Payer: Self-pay | Admitting: Family Medicine

## 2023-10-26 ENCOUNTER — Ambulatory Visit (INDEPENDENT_AMBULATORY_CARE_PROVIDER_SITE_OTHER): Payer: Medicare Other

## 2023-10-26 VITALS — BP 120/60 | HR 60 | Temp 97.9°F | Ht 75.0 in | Wt 193.2 lb

## 2023-10-26 DIAGNOSIS — Z0000 Encounter for general adult medical examination without abnormal findings: Secondary | ICD-10-CM | POA: Diagnosis not present

## 2023-10-26 NOTE — Progress Notes (Cosign Needed Addendum)
 Subjective:   Raunak D Winfree is a 73 y.o. who presents for a Medicare Wellness preventive visit.  As a reminder, Annual Wellness Visits don't include a physical exam, and some assessments may be limited, especially if this visit is performed virtually. We may recommend an in-person follow-up visit with your provider if needed.  Visit Complete: In person    Persons Participating in Visit: Patient.  AWV Questionnaire: No: Patient Medicare AWV questionnaire was not completed prior to this visit.  Cardiac Risk Factors include: advanced age (>66men, >47 women);male gender     Objective:     Today's Vitals   10/26/23 0909  BP: 120/60  Pulse: 60  Temp: 97.9 F (36.6 C)  TempSrc: Oral  SpO2: 97%  Weight: 193 lb 3.2 oz (87.6 kg)  Height: 6\' 3"  (1.905 m)   Body mass index is 24.15 kg/m.     10/26/2023    9:32 AM 06/14/2023    8:41 PM 02/17/2023    1:54 PM 02/17/2023    6:26 AM 12/21/2022   11:22 AM 02/07/2022    3:05 AM 04/29/2020    8:19 AM  Advanced Directives  Does Patient Have a Medical Advance Directive? No No No No Yes No No  Does patient want to make changes to medical advance directive?     No - Guardian declined    Would patient like information on creating a medical advance directive? No - Patient declined No - Patient declined No - Patient declined No - Patient declined  No - Patient declined No - Patient declined    Current Medications (verified) Outpatient Encounter Medications as of 10/26/2023  Medication Sig   amiodarone  (PACERONE ) 200 MG tablet Take 1 tablet (200 mg total) by mouth daily.   cetirizine  (ZYRTEC ) 10 MG tablet Take 10 mg by mouth daily as needed for allergies.   ezetimibe  (ZETIA ) 10 MG tablet Take 1 tablet (10 mg total) by mouth daily.   folic acid (FOLVITE) 1 MG tablet Take 1 mg by mouth daily.     Golimumab (SIMPONI ARIA IV) Inject into the vein. Every 8 weeks   methotrexate 2.5 MG tablet Take 17.5 mg by mouth every Monday. 7 tablets-Monday    pantoprazole  (PROTONIX ) 40 MG tablet Take 1 tablet (40 mg total) by mouth 2 (two) times daily.   No facility-administered encounter medications on file as of 10/26/2023.    Allergies (verified) Cefprozil  and Statins   History: Past Medical History:  Diagnosis Date   Acid reflux disease    Allergy    Asthma    as child   Atrial fibrillation (HCC)    BMI between 19-24,adult JUL 2012 191.8 LBS   Collagen vascular disease (HCC)    GERD (gastroesophageal reflux disease)    Internal hemorrhoids with other complication    TCS DEC 2013   Presence of Watchman left atrial appendage closure device 02/17/2023   31mm Watchman FLX Pro placed by Dr. Marven Slimmer   Pulmonary nodule 2013   [facility] - Repeat 08/2012   Rheumatoid arthritis(714.0)    Skin cancer    Past Surgical History:  Procedure Laterality Date   ATRIAL FIBRILLATION ABLATION N/A 12/21/2022   Procedure: ATRIAL FIBRILLATION ABLATION;  Surgeon: Boyce Byes, MD;  Location: [facility];  Service: Cardiovascular;  Laterality: N/A;   COLONOSCOPY  2009 NUR [facility]   MULTIPLE SIMPLE ADENOMAS   COLONOSCOPY N/A 05/02/2017   Dr. Nolene Baumgarten: 4 simple adenomas removed, diverticulosis.  Next colonoscopy in 3 years.  COLONOSCOPY W/ POLYPECTOMY  2010 NUR   COLONOSCOPY WITH PROPOFOL   05/15/2012   [facility]:Two sessile polyps ranging between 3-72mm in size were found in the ascending colon and rectum; multiple biopsies were performed/Mild diverticulosis was noted in the sigmoid colon/The colon mucosa was otherwise normal/ Moderate sized internal hemorrhoids. TCS 04/2017.   COLONOSCOPY WITH PROPOFOL  N/A 04/29/2020   Procedure: COLONOSCOPY WITH PROPOFOL ;  Surgeon: Vinetta Greening, DO;  Location: [facility];  Service: Endoscopy;  Laterality: N/A;  7:30am   ESOPHAGOGASTRODUODENOSCOPY  01/01/2011 AV:WUJW CHRONIC GASTRITIS   SLF: Hiatal Hernia/mild gastritis/stricture in the distal esophagus   FOOT SURGERY Left    LEFT ATRIAL APPENDAGE OCCLUSION N/A  02/17/2023   Procedure: LEFT ATRIAL APPENDAGE OCCLUSION;  Surgeon: Boyce Byes, MD;  Location: [facility];  Service: Cardiovascular;  Laterality: N/A;   POLYPECTOMY  05/15/2012   SIMPLE ADENOMA(2)   POLYPECTOMY  04/29/2020   Procedure: POLYPECTOMY;  Surgeon: Vinetta Greening, DO;  Location: [facility];  Service: Endoscopy;;   SAVORY DILATION  01/01/2011   Procedure: SAVORY DILATION;  Surgeon: Pauleen Borne, MD;  Location: [facility];  Service: Endoscopy;  Laterality: N/A;   SHOULDER OPEN ROTATOR CUFF REPAIR  2005   TEE WITHOUT CARDIOVERSION N/A 02/17/2023   Procedure: TRANSESOPHAGEAL ECHOCARDIOGRAM;  Surgeon: Boyce Byes, MD;  Location: [facility] INVASIVE CV LAB;  Service: Cardiovascular;  Laterality: N/A;   WRIST FUSION     Family History  Problem Relation Age of Onset   Heart disease Mother        Leaky Heart Valve   Diabetes Father    Cancer Sister        breast   Cancer Brother        "intestines"   Congestive Heart Failure Brother    Crohn's disease Daughter    Cancer Maternal Grandmother    Colon cancer Neg Hx    Colon polyps Neg Hx    Social History   Socioeconomic History   Marital status: Married    Spouse name: Not on file   Number of children: 2   Years of education: Not on file   Highest education level: High school graduate  Occupational History   Not on file  Tobacco Use   Smoking status: Never    Passive exposure: Never   Smokeless tobacco: Never   Tobacco comments:    Never smoked 01/18/23  Vaping Use   Vaping status: Never Used  Substance and Sexual Activity   Alcohol use: No   Drug use: No   Sexual activity: Not Currently    Birth control/protection: None  Other Topics Concern   Not on file  Social History Narrative   MARRIED-Daughter, Edwardo Graft, nurse (ED [employer]), 2 CHILDREN   Social Drivers of Health   Financial Resource Strain: Low Risk  (10/26/2023)   Overall Financial Resource Strain (CARDIA)    Difficulty of  Paying Living Expenses: Not hard at all  Food Insecurity: No Food Insecurity (10/26/2023)   Hunger Vital Sign    Worried About Running Out of Food in the Last Year: Never true    Ran Out of Food in the Last Year: Never true  Transportation Needs: No Transportation Needs (10/26/2023)   PRAPARE - Administrator, Civil Service (Medical): No    Lack of Transportation (Non-Medical): No  Physical Activity: Inactive (10/26/2023)   Exercise Vital Sign    Days of Exercise per Week: 0 days  Minutes of Exercise per Session: 0 min  Stress: No Stress Concern Present (10/26/2023)   [employer] of Occupational Health - Occupational Stress Questionnaire    Feeling of Stress : Not at all  Social Connections: Socially Integrated (10/26/2023)   Social Connection and Isolation Panel [NHANES]    Frequency of Communication with Friends and Family: More than three times a week    Frequency of Social Gatherings with Friends and Family: More than three times a week    Attends Religious Services: More than 4 times per year    Active Member of [employer] or Organizations: Yes    Attends Engineer, structural: More than 4 times per year    Marital Status: Married    Tobacco Counseling Counseling given: Not Answered Tobacco comments: Never smoked 01/18/23    Clinical Intake:  Pre-visit preparation completed: Yes  Pain : No/denies pain     BMI - recorded: 24.15 Nutritional Status: BMI of 19-24  Normal Nutritional Risks: None Diabetes: No  No results found for: "HGBA1C"   How often do you need to have someone help you when you read instructions, pamphlets, or other written materials from your doctor or pharmacy?: 1 - Never  Interpreter Needed?: No  Information entered by :: Farris Hong LPN   Activities of Daily Living     10/26/2023    9:31 AM 02/17/2023    1:57 PM  In your present state of health, do you have any difficulty performing the following activities:  Hearing? 0    Vision? 0   Difficulty concentrating or making decisions? 0   Walking or climbing stairs? 0   Dressing or bathing? 0   Doing errands, shopping? 0 0  Preparing Food and eating ? N   Using the Toilet? N   In the past six months, have you accidently leaked urine? N   Do you have problems with loss of bowel control? N   Managing your Medications? N   Managing your Finances? N   Housekeeping or managing your Housekeeping? N     Patient Care Team: Francenia Ingle, NP as PCP - General (Family Medicine) Elmyra Haggard, MD as PCP - Cardiology (Cardiology) Boyce Byes, MD as PCP - Electrophysiology (Cardiology) Alyce Jubilee, MD (Inactive) (Gastroenterology) Vinetta Greening, DO as Consulting Physician (Internal Medicine) Rheumatology, [facility] ([facility], [facility], [facility]  I have updated your Care Teams any recent Medical Services you may have received from other providers in the past year.     Assessment:    This is a routine wellness examination for Jailan.  Hearing/Vision screen Hearing Screening - Comments:: Wears Hearing Aids Vision Screening - Comments:: Wears rx glasses - up to date with routine eye exams with  Dr Nolon Baxter   Goals Addressed               This Visit's Progress     Increase physical activity (pt-stated)        Remain Active.       Depression Screen     10/26/2023    9:11 AM 03/02/2023   10:04 AM 08/30/2022    9:12 AM 06/12/2021   10:02 AM 06/11/2020    9:04 AM 06/11/2019    8:42 AM 06/06/2018    9:30 AM  PHQ 2/9 Scores  PHQ - 2 Score 0 0 0 0 0 0 0  PHQ- 9 Score  0 0  Fall Risk     10/26/2023    9:31 AM 03/02/2023   10:03 AM 08/30/2022    9:12 AM 06/11/2020    9:05 AM 06/11/2019   10:40 AM  Fall Risk   Falls in the past year? 0 0 0 0 0  Number falls in past yr: 0 0 0    Injury with Fall? 0 0 0    Risk for fall due to : No Fall Risks No Fall Risks     Follow up Falls  evaluation completed Falls evaluation completed  Falls evaluation completed Falls evaluation completed    MEDICARE RISK AT HOME: Medicare Risk at Home Any stairs in or around the home?: No If so, are there any without handrails?: No Home free of loose throw rugs in walkways, pet beds, electrical cords, etc?: Yes Adequate lighting in your home to reduce risk of falls?: Yes Life alert?: No Use of a cane, walker or w/c?: No Grab bars in the bathroom?: Yes Shower chair or bench in shower?: No Elevated toilet seat or a handicapped toilet?: Yes  TIMED UP AND GO:  Was the test performed?  Yes  Length of time to ambulate 10 feet: 10 sec   Cognitive Function: 6CIT completed        10/26/2023    9:32 AM  6CIT Screen  What Year? 0 points  What month? 0 points  What time? 0 points  Count back from 20 0 points  Months in reverse 0 points  Repeat phrase 0 points  Total Score 0 points    Immunizations Immunization History  Administered Date(s) Administered   Fluad Quad(high Dose 65+) 02/22/2020, 03/26/2021, 02/19/2022   Fluad Trivalent(High Dose 65+) 03/02/2023   Influenza,inj,Quad PF,6+ Mos 03/26/2014, 03/12/2015, 04/07/2016, 02/24/2017, 02/09/2018, 02/24/2019   Moderna Sars-Covid-2 Vaccination 07/06/2019, 08/03/2019   Pneumococcal Conjugate-13 11/29/2014   Pneumococcal Polysaccharide-23 01/28/2016   Td 09/19/2013    Screening Tests Health Maintenance  Topic Date Due   Zoster Vaccines- Shingrix (1 of 2) Never done   COVID-19 Vaccine (3 - Moderna risk series) 08/31/2019   DTaP/Tdap/Td (2 - Tdap) 09/20/2023   INFLUENZA VACCINE  12/23/2023   Medicare Annual Wellness (AWV)  10/25/2024   Colonoscopy  04/29/2025   Pneumonia Vaccine 26+ Years old  Completed   Hepatitis C Screening  Completed   HPV VACCINES  Aged Out   Meningococcal B Vaccine  Aged Out    Health Maintenance  Health Maintenance Due  Topic Date Due   Zoster Vaccines- Shingrix (1 of 2) Never done   COVID-19  Vaccine (3 - Moderna risk series) 08/31/2019   DTaP/Tdap/Td (2 - Tdap) 09/20/2023   Health Maintenance Items Addressed:   Additional Screening:  Vision Screening: Recommended annual ophthalmology exams for early detection of glaucoma and other disorders of the eye. Would you like a referral to an eye doctor? No    Dental Screening: Recommended annual dental exams for proper oral hygiene  Community Resource Referral / Chronic Care Management: CRR required this visit?  No   CCM required this visit?  No   Plan:    I have personally reviewed and noted the following in the patient's chart:   Medical and social history Use of alcohol, tobacco or illicit drugs  Current medications and supplements including opioid prescriptions. Patient is not currently taking opioid prescriptions. Functional ability and status Nutritional status Physical activity Advanced directives List of other physicians Hospitalizations, surgeries, and ER visits in previous 12 months Vitals Screenings to  include cognitive, depression, and falls Referrals and appointments  In addition, I have reviewed and discussed with patient certain preventive protocols, quality metrics, and best practice recommendations. A written personalized care plan for preventive services as well as general preventive health recommendations were provided to patient.   Dewayne Ford, LPN   05/29/1094   After Visit Summary: (In Person-Printed) AVS printed and given to the patient  Notes: Nothing significant to report at this time.

## 2023-10-26 NOTE — Patient Instructions (Addendum)
 Steven Glover , Thank you for taking time out of your busy schedule to complete your Annual Wellness Visit with me. I enjoyed our conversation and look forward to speaking with you again next year. I, as well as your care team,  appreciate your ongoing commitment to your health goals. Please review the following plan we discussed and let me know if I can assist you in the future. Your Game plan/ To Do List    Referrals: If you haven't heard from the office you've been referred to, please reach out to them at the phone provided.   Follow up Visits: Next Medicare AWV with our clinical staff: 10/31/24 @ 9:20a   Have you seen your provider in the last 6 months (3 months if uncontrolled diabetes)?  Next Office Visit with your provider: 03/01/24 @ 10:40a  Clinician Recommendations:  Aim for 30 minutes of exercise or brisk walking, 6-8 glasses of water , and 5 servings of fruits and vegetables each day.       This is a list of the screening recommended for you and due dates:  Health Maintenance  Topic Date Due   Zoster (Shingles) Vaccine (1 of 2) Never done   COVID-19 Vaccine (3 - Moderna risk series) 08/31/2019   DTaP/Tdap/Td vaccine (2 - Tdap) 09/20/2023   Flu Shot  12/23/2023   Medicare Annual Wellness Visit  10/25/2024   Colon Cancer Screening  04/29/2025   Pneumonia Vaccine  Completed   Hepatitis C Screening  Completed   HPV Vaccine  Aged Out   Meningitis B Vaccine  Aged Out    Advanced directives: (Declined) Advance directive discussed with you today. Even though you declined this today, please call our office should you change your mind, and we can give you the proper paperwork for you to fill out. Advance Care Planning is important because it:  [x]  Makes sure you receive the medical care that is consistent with your values, goals, and preferences  [x]  It provides guidance to your family and loved ones and reduces their decisional burden about whether or not they are making the right  decisions based on your wishes.  Follow the link provided in your after visit summary or read over the paperwork we have mailed to you to help you started getting your Advance Directives in place. If you need assistance in completing these, please reach out to us  so that we can help you!  See attachments for Preventive Care and Fall Prevention Tips.

## 2023-11-01 ENCOUNTER — Encounter: Payer: Self-pay | Admitting: Cardiovascular Disease

## 2023-11-01 ENCOUNTER — Ambulatory Visit: Attending: Cardiology | Admitting: Cardiovascular Disease

## 2023-11-01 DIAGNOSIS — I480 Paroxysmal atrial fibrillation: Secondary | ICD-10-CM | POA: Diagnosis present

## 2023-11-01 DIAGNOSIS — E782 Mixed hyperlipidemia: Secondary | ICD-10-CM | POA: Diagnosis present

## 2023-11-01 DIAGNOSIS — E785 Hyperlipidemia, unspecified: Secondary | ICD-10-CM | POA: Insufficient documentation

## 2023-11-01 DIAGNOSIS — D6869 Other thrombophilia: Secondary | ICD-10-CM | POA: Insufficient documentation

## 2023-11-01 DIAGNOSIS — I2510 Atherosclerotic heart disease of native coronary artery without angina pectoris: Secondary | ICD-10-CM | POA: Diagnosis present

## 2023-11-01 DIAGNOSIS — I10 Essential (primary) hypertension: Secondary | ICD-10-CM | POA: Insufficient documentation

## 2023-11-01 DIAGNOSIS — Z95818 Presence of other cardiac implants and grafts: Secondary | ICD-10-CM | POA: Diagnosis present

## 2023-11-01 DIAGNOSIS — G4733 Obstructive sleep apnea (adult) (pediatric): Secondary | ICD-10-CM | POA: Insufficient documentation

## 2023-11-01 NOTE — Patient Instructions (Signed)
 Medication Instructions:  Your physician recommends that you continue on your current medications as directed. Please refer to the Current Medication list given to you today.  *If you need a refill on your cardiac medications before your next appointment, please call your pharmacy*  Lab Work: None ordered  If you have labs (blood work) drawn today and your tests are completely normal, you will receive your results only by: MyChart Message (if you have MyChart) OR A paper copy in the mail If you have any lab test that is abnormal or we need to change your treatment, we will call you to review the results.  Testing/Procedures: None ordered  Follow-Up: At [facility], you and your health needs are our priority.  As part of our continuing mission to provide you with exceptional heart care, our providers are all part of one team.  This team includes your primary Cardiologist (physician) and Advanced Practice Providers or APPs (Physician Assistants and Nurse Practitioners) who all work together to provide you with the care you need, when you need it.  Your next appointment:   As scheduled   Provider:   Dr. Alvis Ba    We recommend signing up for the patient portal called "MyChart".  Sign up information is provided on this After Visit Summary.  MyChart is used to connect with patients for Virtual Visits (Telemedicine).  Patients are able to view lab/test results, encounter notes, upcoming appointments, etc.  Non-urgent messages can be sent to your provider as well.   To learn more about what you can do with MyChart, go to [email or web address].   Other Instructions

## 2023-11-03 ENCOUNTER — Encounter: Payer: Self-pay | Admitting: Cardiovascular Disease

## 2023-11-03 NOTE — Progress Notes (Signed)
 Cardiology Office Note    Date:  11/03/2023   ID:  Kushal Saunders Erpelding, DOB 06/20/1950, MRN [id number]  PCP:  Francenia Ingle, NP  Cardiologist:  Magnus Schuller, MD (sleep); previous Dr. Ola Berger EP: Dr. Harvie Liner  New sleep consultation following initiation of CPAP therapy   History of Present Illness:  Steven Glover is a 73 y.o. male who has a history of mild nonobstructive CAD, atrial fibrillation, status post Watchman device, hyperlipidemia, hiatal hernia, GERD, and rheumatoid arthritis.  Patient had undergone prior coronary CTA which showed a calcium  score of 38.2 (25th percentile) consistent with mild nonobstructive CAD.  CT of his abdomen had shown right hydronephrosis concerning for possible infiltrative urethral lesion or stricture in the right UPJ.  He developed increased heart rate up to 150 bpm during attempted your ureteroscopic and was found to have atrial fibrillation.  He subsequently underwent A-fib ablation in July 2020 for and on February 17, 2023 underwent closure of his left atrial appendage with a Watchman device by Dr. Marven Slimmer.  Plan was to continue Eliquis  for 45 days postprocedure followed by clopidogrel .  With his history of atrial fibrillation, he ultimately was referred for a sleep study and had a home study done at [facility] on January 03, 2023.  This revealed mild to moderate sleep apnea with overall AHI at 14.0.  Sleep apnea was moderate with supine sleep AHI at 23.1/h versus nonsupine sleep at 6.2/h.  He had mild snoring.  He subsequently underwent a CPAP titration study on June 14, 2023.  He was titrated to optimal pressure of 13 cm of water .  There was mild snoring during the titration study and he had rare isolated PVCs.  He received a ResMed AirSense 11 AutoSet unit on September 02, 2023.  At the [facility] is his DME company.  Presently, he goes to bed around 11 PM and wakes up at 7 AM.  He feels so much better since he has initiated CPAP  therapy.  His current device was initially set at a ramp start pressure of 4 and his pressure range was 12 to 17 cm of water .  Compliance is excellent with average use at 7 hours and 43 minutes per night.  AHI is 4.5 and his 95th percentile pressure is 13.5 with maximum average pressure 14.5.  He did have central index of 3.5/h.  Presently, he only wakes up once to go urinate.  He denies residual daytime sleepiness is unaware of any snoring.  He presents for his initial sleep evaluation and consultation.   Past Medical History:  Diagnosis Date   Acid reflux disease    Allergy    Asthma    as child   Atrial fibrillation (HCC)    BMI between 19-24,adult JUL 2012 191.8 LBS   Collagen vascular disease (HCC)    GERD (gastroesophageal reflux disease)    Internal hemorrhoids with other complication    TCS DEC 2013   Presence of Watchman left atrial appendage closure device 02/17/2023   31mm Watchman FLX Pro placed by Dr. Marven Slimmer   Pulmonary nodule 2013   [facility] - Repeat 08/2012   Rheumatoid arthritis(714.0)    Skin cancer     Past Surgical History:  Procedure Laterality Date   ATRIAL FIBRILLATION ABLATION N/A 12/21/2022   Procedure: ATRIAL FIBRILLATION ABLATION;  Surgeon: Boyce Byes, MD;  Location: [facility];  Service: Cardiovascular;  Laterality: N/A;   COLONOSCOPY  2009 NUR [facility]  MULTIPLE SIMPLE ADENOMAS   COLONOSCOPY N/A 05/02/2017   Dr. Nolene Baumgarten: 4 simple adenomas removed, diverticulosis.  Next colonoscopy in 3 years.   COLONOSCOPY W/ POLYPECTOMY  2010 NUR   COLONOSCOPY WITH PROPOFOL   05/15/2012   SLF:Two sessile polyps ranging between 3-70mm in size were found in the ascending colon and rectum; multiple biopsies were performed/Mild diverticulosis was noted in the sigmoid colon/The colon mucosa was otherwise normal/ Moderate sized internal hemorrhoids. TCS 04/2017.   COLONOSCOPY WITH PROPOFOL  N/A 04/29/2020   Procedure: COLONOSCOPY WITH PROPOFOL ;  Surgeon: Vinetta Greening, DO;  Location: [facility];  Service: Endoscopy;  Laterality: N/A;  7:30am   ESOPHAGOGASTRODUODENOSCOPY  01/01/2011 [id number] CHRONIC GASTRITIS   SLF: Hiatal Hernia/mild gastritis/stricture in the distal esophagus   FOOT SURGERY Left    LEFT ATRIAL APPENDAGE OCCLUSION N/A 02/17/2023   Procedure: LEFT ATRIAL APPENDAGE OCCLUSION;  Surgeon: Boyce Byes, MD;  Location: [facility]AS[facility] CV LAB;  Service: Cardiovascular;  Laterality: N/A;   POLYPECTOMY  05/15/2012   SIMPLE ADENOMA(2)   POLYPECTOMY  04/29/2020   Procedure: POLYPECTOMY;  Surgeon: Vinetta Greening, DO;  Location: [facility];  Service: Endoscopy;;   SAVORY DILATION  01/01/2011   Procedure: SAVORY DILATION;  Surgeon: Pauleen Borne, MD;  Location: [facility];  Service: Endoscopy;  Laterality: N/A;   SHOULDER OPEN ROTATOR CUFF REPAIR  2005   TEE WITHOUT CARDIOVERSION N/A 02/17/2023   Procedure: TRANSESOPHAGEAL ECHOCARDIOGRAM;  Surgeon: Boyce Byes, MD;  Location: [facility] INVASIVE CV LAB;  Service: Cardiovascular;  Laterality: N/A;   WRIST FUSION      Current Medications: Outpatient Medications Prior to Visit  Medication Sig Dispense Refill   amiodarone  (PACERONE ) 200 MG tablet Take 1 tablet (200 mg total) by mouth daily. 90 tablet 3   cetirizine  (ZYRTEC ) 10 MG tablet Take 10 mg by mouth daily as needed for allergies.     ezetimibe  (ZETIA ) 10 MG tablet Take 1 tablet (10 mg total) by mouth daily. 90 tablet 1   folic acid (FOLVITE) 1 MG tablet Take 1 mg by mouth daily.       Golimumab (SIMPONI ARIA IV) Inject into the vein. Every 8 weeks     methotrexate 2.5 MG tablet Take 17.5 mg by mouth every Monday. 7 tablets-Monday     pantoprazole  (PROTONIX ) 40 MG tablet Take 1 tablet (40 mg total) by mouth 2 (two) times daily. 180 tablet 3   No facility-administered medications prior to visit.     Allergies:   Cefprozil  and Statins   Social History   Socioeconomic History   Marital status: Married    Spouse name: Not on  file   Number of children: 2   Years of education: Not on file   Highest education level: High school graduate  Occupational History   Not on file  Tobacco Use   Smoking status: Never    Passive exposure: Never   Smokeless tobacco: Never   Tobacco comments:    Never smoked 01/18/23  Vaping Use   Vaping status: Never Used  Substance and Sexual Activity   Alcohol use: No   Drug use: No   Sexual activity: Not Currently    Birth control/protection: None  Other Topics Concern   Not on file  Social History Narrative   MARRIED-Daughter, Edwardo Graft, nurse (ED [employer]), 2 CHILDREN   Social Drivers of Health   Financial Resource Strain: Low Risk  (10/26/2023)   Overall Financial Resource Strain (CARDIA)  Difficulty of Paying Living Expenses: Not hard at all  Food Insecurity: No Food Insecurity (10/26/2023)   Hunger Vital Sign    Worried About Running Out of Food in the Last Year: Never true    Ran Out of Food in the Last Year: Never true  Transportation Needs: No Transportation Needs (10/26/2023)   PRAPARE - Administrator, Civil Service (Medical): No    Lack of Transportation (Non-Medical): No  Physical Activity: Inactive (10/26/2023)   Exercise Vital Sign    Days of Exercise per Week: 0 days    Minutes of Exercise per Session: 0 min  Stress: No Stress Concern Present (10/26/2023)   [employer] of Occupational Health - Occupational Stress Questionnaire    Feeling of Stress : Not at all  Social Connections: Socially Integrated (10/26/2023)   Social Connection and Isolation Panel    Frequency of Communication with Friends and Family: More than three times a week    Frequency of Social Gatherings with Friends and Family: More than three times a week    Attends Religious Services: More than 4 times per year    Active Member of [employer] or Organizations: Yes    Attends Engineer, structural: More than 4 times per year    Marital Status: Married      Family History:   The patient's family history includes Cancer in his brother, maternal grandmother, and sister; Congestive Heart Failure in his brother; Crohn's disease in his daughter; Diabetes in his father; Heart disease in his mother.   ROS General: Negative; No fevers, chills, or night sweats;  HEENT: Negative; No changes in vision or hearing, sinus congestion, difficulty swallowing Pulmonary: Negative; No cough, wheezing, shortness of breath, hemoptysis Cardiovascular: Mild CAD, history of AF, status post ablation and Watchman device GI: History of hiatal hernia, GERD GU: Negative; No dysuria, hematuria, or difficulty voiding Musculoskeletal: Negative; no myalgias, joint pain, or weakness Hematologic/Oncology: Negative; no easy bruising, bleeding Rheumatologic: History of rheumatoid arthritis Endocrine: Negative; no heat/cold intolerance; no diabetes Neuro: Negative; no changes in balance, headaches Skin: Negative; No rashes or skin lesions Psychiatric: Negative; No behavioral problems, depression Sleep: See HPI, no bruxism, restless legs, hypnogognic hallucinations, no cataplexy Other comprehensive 14 point system review is negative.   PHYSICAL EXAM:   VS:  BP 126/74   Pulse (!) 50   Ht 6' 3 (1.905 m)   Wt 195 lb 9.6 oz (88.7 kg)   SpO2 97%   BMI 24.45 kg/m     Repeat blood pressure by me was 116/70  Wt Readings from Last 3 Encounters:  11/01/23 195 lb 9.6 oz (88.7 kg)  10/26/23 193 lb 3.2 oz (87.6 kg)  08/31/23 201 lb (91.2 kg)    General: Alert, oriented, no distress.  Skin: normal turgor, no rashes, warm and dry HEENT: Normocephalic, atraumatic. Pupils equal round and reactive to light; sclera anicteric; extraocular muscles intact;  Nose without nasal septal hypertrophy Mouth/Parynx benign; Mallinpatti scale 3 Neck: No JVD, no carotid bruits; normal carotid upstroke Lungs: clear to ausculatation and percussion; no wheezing or rales Chest wall: without tenderness to  palpitation Heart: PMI not displaced, RRR, s1 s2 normal, 1/6 systolic murmur, no diastolic murmur, no rubs, gallops, thrills, or heaves Abdomen: soft, nontender; no hepatosplenomehaly, BS+; abdominal aorta nontender and not dilated by palpation. Back: no CVA tenderness Pulses 2+ Musculoskeletal: full range of motion, normal strength, no joint deformities Extremities: no clubbing cyanosis or edema, Homan's sign negative  Neurologic: grossly  nonfocal; Cranial nerves grossly wnl Psychologic: Normal mood and affect   Studies/Labs Reviewed:   EKG Interpretation Date/Time:  Tuesday November 01 2023 12:11:39 EDT Ventricular Rate:  50 PR Interval:  180 QRS Duration:  96 QT Interval:  462 QTC Calculation: 421 R Axis:   11  Text Interpretation: Sinus bradycardia When compared with ECG of 28-Feb-2023 10:38, Sinus rhythm has replaced Ectopic atrial rhythm Confirmed by Magnus Schuller ([id number]) on 11/03/2023 5:22:39 PM    Recent Labs:    Latest Ref Rng & Units 08/31/2023   10:48 AM 03/28/2023   11:41 AM 01/18/2023    1:46 PM  BMP  Glucose 70 - 99 mg/dL 79  70  409   BUN 6 - 23 mg/dL 14  14  13    Creatinine 0.40 - 1.50 mg/dL 8.11  9.14  7.82   BUN/Creat Ratio 10 - 24  13    Sodium 135 - 145 mEq/L 137  140  141   Potassium 3.5 - 5.1 mEq/L 4.5  4.4  3.9   Chloride 96 - 112 mEq/L 103  104  106   CO2 19 - 32 mEq/L 28  25  21    Calcium  8.4 - 10.5 mg/dL 9.1  9.0  9.0         Latest Ref Rng & Units 08/31/2023   10:48 AM 10/11/2022    9:44 AM 02/07/2022    3:10 AM  Hepatic Function  Total Protein 6.0 - 8.3 g/dL 6.3  6.5  6.7   Albumin 3.5 - 5.2 g/dL 4.2  3.7  4.0   AST 0 - 37 U/L 28  24  29    ALT 0 - 53 U/L 29  21  29    Alk Phosphatase 39 - 117 U/L 76  66  72   Total Bilirubin 0.2 - 1.2 mg/dL 0.7  0.5  0.6        Latest Ref Rng & Units 01/18/2023    1:46 PM 11/29/2022    7:59 AM 10/11/2022    9:44 AM  CBC  WBC 4.0 - 10.5 K/uL 4.9  3.7  4.0   Hemoglobin 13.0 - 17.0 g/dL 95.6  21.3  08.6    Hematocrit 39.0 - 52.0 % 41.2  42.5  42.1   Platelets 150 - 400 K/uL 134  119  133    Lab Results  Component Value Date   MCV 96.0 01/18/2023   MCV 95.3 11/29/2022   MCV 93.3 10/11/2022   Lab Results  Component Value Date   TSH 4.219 10/11/2022   No results found for: HGBA1C   BNP No results found for: BNP  ProBNP No results found for: PROBNP   Lipid Panel     Component Value Date/Time   CHOL 137 08/31/2023 1048   CHOL 125 08/31/2022 0809   TRIG 316.0 (H) 08/31/2023 1048   HDL 29.50 (L) 08/31/2023 1048   HDL 31 (L) 08/31/2022 0809   CHOLHDL 5 08/31/2023 1048   VLDL 63.2 (H) 08/31/2023 1048   LDLCALC 44 08/31/2023 1048   LDLCALC 67 08/31/2022 0809   LABVLDL 27 08/31/2022 0809     RADIOLOGY: No results found.   Additional studies/ records that were reviewed today include:   Patient Name: Izzak, Fries Date: 01/03/2023 Gender: Male D.O.B: 1951/04/05 Age (years): 72 Referring Provider: Nathanel Bal PA-C Height (inches): 75 Interpreting Physician: Magnus Schuller MD, ABSM Weight (lbs): 192 RPSGT: Debborah Fairly BMI: 24 MRN: [id number] Neck Size: <br>   CLINICAL  INFORMATION Sleep Study Type: HST   Indication for sleep study: PAF, Snoring   Epworth Sleepiness Score: 5   SLEEP STUDY TECHNIQUE A multi-channel overnight portable sleep study was performed. The channels recorded were: nasal airflow, thoracic respiratory movement, and oxygen saturation with a pulse oximetry. Snoring was also monitored.   MEDICATIONS amiodarone  (PACERONE ) 200 MG tablet apixaban  (ELIQUIS ) 5 MG TABS tablet ezetimibe  (ZETIA ) 10 MG tablet fexofenadine (ALLEGRA) 180 MG tablet folic acid (FOLVITE) 1 MG tablet Golimumab (SIMPONI ARIA IV) methotrexate 2.5 MG tablet pantoprazole  (PROTONIX ) 40 MG tablet Patient self administered medications include: N/A.   SLEEP ARCHITECTURE Patient was studied for 163 minutes. The sleep efficiency was 33.0 % and the patient  was supine for 0%. The arousal index was 0.0 per hour.   RESPIRATORY PARAMETERS The overall AHI was 14.0 per hour, with a central apnea index of 0 per hour. There is a positional component with moderate OSA with supine sleep (AHI 23.1/h) versus non-supine sleep (AHI 6.2/h).   The oxygen nadir was 90% during sleep.   CARDIAC DATA Mean heart rate during sleep was 58.8 bpm. Slowest HR was 52. Reported maximal rate 165, not verified.   IMPRESSIONS - Mild obstructive sleep apnea occurred during this study (AHI 14.0/h); however, modrate OSA with supine sleep (AHI 23.1/h). - The patient had no oxygen desaturation during the study (Min O2 90%) - Minimal non audible snoring.   DIAGNOSIS - Obstructive Sleep Apnea (G47.33)   RECOMMENDATIONS - In this patient with cardiovascular co-morbidities recommend therapeutic CPAP titration to determine optimal pressure required to alleviate sleep disordered breathing. If unable for an in-lab study, initiate Auto-PAP with EPR of 3 at 6 - 16 cm of water . - Effort should be made to optimize nasal and oropharyngeal patency. - Positional therapy avoiding supine position during sleep. - If patient is against CPAP initiation, a customized oral appliance may be considered. - Avoid alcohol, sedatives and other CNS depressants that may worsen sleep apnea and disrupt normal sleep architecture. - Sleep hygiene should be reviewed to assess factors that may improve sleep quality. - Weight management and regular exercise should be initiated or continued. - Recommend a dowload and sleep clinic evaluation after 4 - 6 weeks of therapy.   CPAP TITRATION STUDY: 06/13/1013 CLINICAL INFORMATION The patient is referred for a CPAP titration to treat sleep apnea.   Date of HST: 01/03/2023:  AHI 14.0/h, supine AHI 23.1/h   SLEEP STUDY TECHNIQUE As per the AASM Manual for the Scoring of Sleep and Associated Events v2.3 (April 2016) with a hypopnea requiring 4% desaturations.    The channels recorded and monitored were frontal, central and occipital EEG, electrooculogram (EOG), submentalis EMG (chin), nasal and oral airflow, thoracic and abdominal wall motion, anterior tibialis EMG, snore microphone, electrocardiogram, and pulse oximetry. Continuous positive airway pressure (CPAP) was initiated at the beginning of the study and titrated to treat sleep-disordered breathing.   MEDICATIONS amiodarone  (PACERONE ) 200 MG tablet cetirizine  (ZYRTEC ) 10 MG tablet clopidogrel  (PLAVIX ) 75 MG tablet ezetimibe  (ZETIA ) 10 MG tablet folic acid (FOLVITE) 1 MG tablet Golimumab (SIMPONI ARIA IV) methotrexate 2.5 MG tablet pantoprazole  (PROTONIX ) 40 MG tablet Medications self-administered by patient taken the night of the study : N/A   TECHNICIAN COMMENTS Comments added by technician: PATIENT WAS ORDERED AS A CPAP TITRATION Comments added by scorer: N/A   RESPIRATORY PARAMETERS Optimal PAP Pressure (cm):13AHI at Optimal Pressure (/hr):0 Overall Minimal O2 (%):91.0Supine % at Optimal Pressure (%):0 Minimal O2 at Optimal Pressure (%): 93.0  SLEEP ARCHITECTURE The study was initiated at 9:49:41 PM and ended at 4:04:43 AM.   Sleep onset time was 54.9 minutes and the sleep efficiency was 55.6%. The total sleep time was 208.5 minutes.   The patient spent 19.4% of the night in stage N1 sleep, 71.5% in stage N2 sleep, 0.0% in stage N3 and 9.1% in REM.Stage REM latency was 117.0 minutes   Wake after sleep onset was 111.6. Alpha intrusion was absent. Supine sleep was 17.03%.   CARDIAC DATA The 2 lead EKG demonstrated sinus rhythm. The mean heart rate was 56.8 beats per minute. Other EKG findings include: None.   LEG MOVEMENT DATA The total Periodic Limb Movements of Sleep (PLMS) were 0. The PLMS index was 0.0. A PLMS index of <15 is considered normal in adults.   IMPRESSIONS - CPAP was initiated at 6 CM and was titrated to optimal PAP pressure at 13 cm of water . (AHI 0; RDI  2.9/h - Central sleep apnea was not noted during this titration (CAI = 1.2/h). - Significant oxygen desaturations were not observed during this titration (2 nadir 91.0%). - The patient snored with soft snoring volume during this titration study. - Rare isolated PVS's were observed during this study. - Clinically significant periodic limb movements were not noted during this study. Arousals associated with PLMs were significant.   DIAGNOSIS - Obstructive Sleep Apnea (G47.33)   RECOMMENDATIONS - Recommend an initial trial of CPAP Auto therapy with EPR of 3 at 12 - 17 cm H2O with heated humidification .A Large size Resmed Full Face AirFit F20 mask was used for the titration study.  - Efforts should be made to optimize nasal and oropharyngeal patency - Avoid alcohol, sedatives and other CNS depressants that may worsen sleep apnea and disrupt normal sleep architecture. - Sleep hygiene should be reviewed to assess factors that may improve sleep quality. - Weight management and regular exercise should be initiated or continued. - Recommend a download and sleep clinic evaluation after 4-6 weeks of therapy.  ASSESSMENT:    1. OSA (obstructive sleep apnea)   2. Paroxysmal atrial fibrillation (HCC)   3. Presence of Watchman left atrial appendage closure device   4. Coronary artery disease involving native coronary artery of native heart without angina pectoris   5. Mixed hyperlipidemia   6. Hyperlipidemia LDL goal <70   7. Essential hypertension   8. Hypercoagulable state due to paroxysmal atrial fibrillation [facility])     PLAN:  Steven Glover is a 73 year old gentleman who has a history of mild nonobstructive CAD, paroxysmal atrial fibrillation who underwent successful ablation with subsequent Watchman device insertion, as well as a history of hyperlipidemia, hiatal hernia, GERD, and rheumatoid arthritis.  He first developed rapid atrial fibrillation during attempted ureteral procedure.  Due  to concerns for obstructive sleep apnea he was referred for an initial home study and was found to have mild to moderate overall sleep apnea with moderate sleep apnea during supine sleep with an AHI of 23.1/h.  He subsequently underwent CPAP titration study and was titrated up to 13 cm of water  with excellent response.  He has been on CPAP therapy since September 02, 2023 when he received a ResMed AirSense 11 AutoSet unit with [facility] as his DME company.  He notes marked improvement in his sleep pattern.  Nocturia is 0-1 time per night.  He often goes to bed at 11 and wakes up at 7.  On his most recent download, sleep duration average 7 hours  and 43 minutes.  His CPAP is set at a pressure range of 12 to 17 cm of water  with his 95th percentile pressure 13.5 and maximal average pressure 14.5.  AHI is 4.5 and he did have some central events with central apnea index at 3.5.  Presently, I will change his ramp start pressure to 6.  I had a comprehensive discussion with him regarding sleep apnea and its potential adverse cardiovascular consequences if left untreated.  Specifically I discussed its effect on blood pressure with arousals contributing to increased sympathetic tone during periods of parasympathetic activity.  I discussed its effects on nocturnal arrhythmias, increased risk for atrial fibrillation and following cardioversion if left untreated increased recurrence of atrial fibrillation.  In addition I discussed its negative effects on insulin resistance, increased inflammatory markers, as well as potential increase for nocturnal GERD.  I discussed potential nocturnal hypoxemia contributing to nocturnal ischemia particularly with underlying CAD.  He feels much better since initiating therapy.  I discussed optimal sleep duration at 7 to 9 hours.  Clinically he is doing well.  He denies any significant nocturia and he is unaware of any residual snoring with CPAP treatment.  He and his wife wish to transition his  cardiology care to Dr. Alvis Ba.  Remotely he had seen Dr. Ola Berger.  I discussed with him my plans for imminent retirement.  He will see Dr. Micael Adas for follow-up sleep evaluation in 1 year or sooner as needed.  Medication Adjustments/Labs and Tests Ordered: Current medicines are reviewed at length with the patient today.  Concerns regarding medicines are outlined above.  Medication changes, Labs and Tests ordered today are listed in the Patient Instructions below. Patient Instructions  Medication Instructions:  Your physician recommends that you continue on your current medications as directed. Please refer to the Current Medication list given to you today.  *If you need a refill on your cardiac medications before your next appointment, please call your pharmacy*  Lab Work: None ordered  If you have labs (blood work) drawn today and your tests are completely normal, you will receive your results only by: MyChart Message (if you have MyChart) OR A paper copy in the mail If you have any lab test that is abnormal or we need to change your treatment, we will call you to review the results.  Testing/Procedures: None ordered  Follow-Up: At [facility], you and your health needs are our priority.  As part of our continuing mission to provide you with exceptional heart care, our providers are all part of one team.  This team includes your primary Cardiologist (physician) and Advanced Practice Providers or APPs (Physician Assistants and Nurse Practitioners) who all work together to provide you with the care you need, when you need it.  Your next appointment:   As scheduled   Provider:   Dr. Alvis Ba    We recommend signing up for the patient portal called MyChart.  Sign up information is provided on this After Visit Summary.  MyChart is used to connect with patients for Virtual Visits (Telemedicine).  Patients are able to view lab/test results, encounter notes, upcoming  appointments, etc.  Non-urgent messages can be sent to your provider as well.   To learn more about what you can do with MyChart, go to [email or web address].   Other Instructions        Signed, Magnus Schuller, MD, [facility], ABSM Diplomate, American Board of Sleep Medicine  11/03/2023 5:36 PM    [facility]  [facility] [address], [place], [place]  [address] Phone: [phone]

## 2023-12-16 ENCOUNTER — Encounter: Payer: Self-pay | Admitting: Gastroenterology

## 2024-01-18 ENCOUNTER — Ambulatory Visit
Admission: RE | Admit: 2024-01-18 | Discharge: 2024-01-18 | Disposition: A | Discharge status: Home / Self Care | Attending: Family Medicine | Admitting: Family Medicine

## 2024-01-18 VITALS — BP 128/73 | HR 59 | Temp 98.2°F | Resp 18

## 2024-01-18 DIAGNOSIS — R519 Headache, unspecified: Secondary | ICD-10-CM

## 2024-01-18 DIAGNOSIS — J3089 Other allergic rhinitis: Secondary | ICD-10-CM

## 2024-01-18 DIAGNOSIS — J0110 Acute frontal sinusitis, unspecified: Secondary | ICD-10-CM

## 2024-01-18 MED ORDER — AZELASTINE HCL 0.1 % NA SOLN: 1.0000 | Freq: Two times a day (BID) | NASAL | 0 refills | Status: DC

## 2024-01-18 MED ORDER — AMOXICILLIN-POT CLAVULANATE 875-125 MG PO TABS: 1.0000 | ORAL_TABLET | Freq: Two times a day (BID) | ORAL | 0 refills | Status: DC

## 2024-01-18 NOTE — ED Triage Notes (Signed)
 Headache, nasal congestion, runny nose, fatigued since Thursday.  Has been taking zyrtec  and tylenol .  States has been having facial pressure.

## 2024-01-18 NOTE — Discharge Instructions (Signed)
 In addition to the prescribed medications, continue taking the Zyrtec  daily, and use saline sinus rinses 1-2 times daily until feeling much better.  If needed, you may also take Coricidin HBP to help with sinus pressure and congestion

## 2024-01-18 NOTE — ED Provider Notes (Signed)
 [facility]    CSN: [id number] Arrival date & time: 01/18/24  1351      History   Chief Complaint Chief Complaint  Patient presents with   Headache    headache, fatigue, runny nose - Entered by patient    HPI Steven Glover is a 73 y.o. male.   Patient presenting today with about a week of progressively worsening sinus headache, nasal congestion, rhinorrhea, facial pain and pressure, tickly cough.  Denies fever, chills, chest pain, shortness of breath, abdominal pain, vomiting, diarrhea.  Started taking Zyrtec  and Tylenol , notes initially had some relief with Zyrtec  but symptoms came back significantly worse yesterday.  History of seasonal allergies and frequent sinus infections.    Past Medical History:  Diagnosis Date   Acid reflux disease    Allergy    Asthma    as child   Atrial fibrillation (HCC)    BMI between 19-24,adult JUL 2012 191.8 LBS   Collagen vascular disease (HCC)    GERD (gastroesophageal reflux disease)    Internal hemorrhoids with other complication    TCS DEC 2013   Presence of Watchman left atrial appendage closure device 02/17/2023   31mm Watchman FLX Pro placed by Dr. 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   Aortic atherosclerosis (HCC) 08/30/2022   Hiatal hernia 03/01/2022   Abnormal CT scan, gastrointestinal tract 03/01/2022   Myalgia due to statin 06/12/2021   Hx of colonic polyps    Thrombocytopenia (HCC) 06/23/2016   BPH (benign prostatic hyperplasia) 01/28/2016   Dyslipidemia (high  LDL; low HDL) 01/28/2016   Internal hemorrhoids with complication 12/14/2012   GERD (gastroesophageal reflux disease) 04/21/2011   Colon cancer screening 04/21/2011   Dysphagia 12/30/2010    Past Surgical History:  Procedure Laterality Date   ATRIAL FIBRILLATION ABLATION N/A 12/21/2022   Procedure: ATRIAL FIBRILLATION ABLATION;  Surgeon: Cindie Ole DASEN, MD;  [facility]: [facility];  Service: Cardiovascular;  Laterality: N/A;   COLONOSCOPY  2009 NUR [facility]   MULTIPLE SIMPLE ADENOMAS   COLONOSCOPY N/A 05/02/2017   Dr. harvey: 4 simple adenomas removed, diverticulosis.  Next colonoscopy in 3 years.   COLONOSCOPY W/ POLYPECTOMY  2010 NUR   COLONOSCOPY WITH PROPOFOL   05/15/2012   SLF:Two sessile polyps ranging between 3-43mm in size were found in the ascending colon and rectum; multiple biopsies were performed/Mild diverticulosis was noted in the sigmoid colon/The colon mucosa was otherwise normal/ Moderate sized internal hemorrhoids. TCS 04/2017.   COLONOSCOPY WITH PROPOFOL  N/A 04/29/2020   Procedure: COLONOSCOPY WITH PROPOFOL ;  Surgeon: Cindie Carlin POUR, DO;  [facility]: [facility];  Service: Endoscopy;  Laterality: N/A;  7:30am   ESOPHAGOGASTRODUODENOSCOPY  01/01/2011 Ar:FPOI CHRONIC GASTRITIS   SLF: Hiatal Hernia/mild gastritis/stricture in the distal esophagus   FOOT SURGERY Left    LEFT ATRIAL APPENDAGE OCCLUSION N/A 02/17/2023   Procedure: LEFT ATRIAL APPENDAGE OCCLUSION;  Surgeon: Cindie Ole DASEN, MD;  [facility]: [facility];  Service: Cardiovascular;  Laterality: N/A;   POLYPECTOMY  05/15/2012   SIMPLE ADENOMA(2)   POLYPECTOMY  04/29/2020   Procedure: POLYPECTOMY;  Surgeon: Cindie Carlin POUR, DO;  [facility];  Service: Endoscopy;;   SAVORY DILATION  01/01/2011   Procedure: SAVORY DILATION;  Surgeon: Margo CHRISTELLA Haddock, MD;  [facility]: [facility];  Service: Endoscopy;  Laterality: N/A;   SHOULDER OPEN ROTATOR CUFF REPAIR  2005   TEE WITHOUT CARDIOVERSION  N/A 02/17/2023   Procedure: TRANSESOPHAGEAL ECHOCARDIOGRAM;  Surgeon: Cindie Ole DASEN, MD;  Location: [facility]VASIVE CV LAB;  Service: Cardiovascular;  Laterality: N/A;   WRIST FUSION         Home Medications    Prior to Admission medications   Medication Sig Start Date End Date Taking? Authorizing Provider  amoxicillin -clavulanate (AUGMENTIN ) 875-125 MG tablet Take 1 tablet by mouth every 12 (twelve) hours. 01/18/24  Yes Stuart Vernell Norris, PA-C  azelastine  (ASTELIN ) 0.1 % nasal spray Place 1 spray into both nostrils 2 (two) times daily. Use in each nostril as directed 01/18/24  Yes Stuart Vernell Norris, PA-C  amiodarone  (PACERONE ) 200 MG tablet Take 1 tablet (200 mg total) by mouth daily. 08/22/23   Okey Vina GAILS, MD  cetirizine  (ZYRTEC ) 10 MG tablet Take 10 mg by mouth daily as needed for allergies.    [provider]  ezetimibe  (ZETIA ) 10 MG tablet Take 1 tablet (10 mg total) by mouth daily. 08/31/23   Williamson, Joanna R, NP  folic acid (FOLVITE) 1 MG tablet Take 1 mg by mouth daily.      [provider]  Golimumab (SIMPONI ARIA IV) Inject into the vein. Every 8 weeks    [provider]  methotrexate 2.5 MG tablet Take 17.5 mg by mouth every Monday. 7 tablets-Monday    [provider]  pantoprazole  (PROTONIX ) 40 MG tablet Take 1 tablet (40 mg total) by mouth 2 (two) times daily. 07/07/23 07/06/24  Shirlean Therisa ORN, NP    Family History Family History  Problem Relation Age of Onset   Heart disease Mother        Leaky Heart Valve   Diabetes Father    Cancer Sister        breast   Cancer Brother        intestines   Congestive Heart Failure Brother    Crohn's disease Daughter    Cancer Maternal Grandmother    Colon cancer Neg Hx    Colon polyps Neg Hx     Social History Social History   Tobacco Use   Smoking status: Never    Passive exposure: Never   Smokeless tobacco: Never   Tobacco comments:    Never smoked 01/18/23  Vaping Use    Vaping status: Never Used  Substance Use Topics   Alcohol use: No   Drug use: No     Allergies   Cefprozil  and Statins   Review of Systems Review of Systems PER HPI  Physical Exam Triage Vital Signs ED Triage Vitals  Encounter Vitals Group     BP 01/18/24 1356 128/73     Girls Systolic BP Percentile --      Girls Diastolic BP Percentile --      Boys Systolic BP Percentile --      Boys Diastolic BP Percentile --      Pulse Rate 01/18/24 1356 (!) 59     Resp 01/18/24 1356 18     Temp 01/18/24 1356 98.2 F (36.8 C)     Temp Source 01/18/24 1356 Oral     SpO2 01/18/24 1356 98 %  Weight --      Height --      Head Circumference --      Peak Flow --      Pain Score 01/18/24 1357 2     Pain Loc --      Pain Education --      Exclude from Growth Chart --    No data found.  Updated Vital Signs BP 128/73 (BP Location: Left Arm)   Pulse (!) 59   Temp 98.2 F (36.8 C) (Oral)   Resp 18   SpO2 98%   Visual Acuity Right Eye Distance:   Left Eye Distance:   Bilateral Distance:    Right Eye Near:   Left Eye Near:    Bilateral Near:     Physical Exam Vitals and nursing note reviewed.  Constitutional:      Appearance: He is well-developed.  HENT:     Head: Atraumatic.     Right Ear: Tympanic membrane and external ear normal.     Left Ear: Tympanic membrane and external ear normal.     Nose: Congestion present.     Mouth/Throat:     Pharynx: Posterior oropharyngeal erythema present. No oropharyngeal exudate.  Eyes:     Conjunctiva/sclera: Conjunctivae normal.     Pupils: Pupils are equal, round, and reactive to light.  Cardiovascular:     Rate and Rhythm: Normal rate and regular rhythm.  Pulmonary:     Effort: Pulmonary effort is normal. No respiratory distress.     Breath sounds: No wheezing or rales.  Musculoskeletal:        General: Normal range of motion.     Cervical back: Normal range of motion and neck supple.  Lymphadenopathy:     Cervical:  No cervical adenopathy.  Skin:    General: Skin is warm and dry.  Neurological:     Mental Status: He is alert and oriented to person, place, and time.  Psychiatric:        Behavior: Behavior normal.      UC Treatments / Results  Labs (all labs ordered are listed, but only abnormal results are displayed) Labs Reviewed - No data to display  EKG   Radiology No results found.  Procedures Procedures (including critical care time)  Medications Ordered in UC Medications - No data to display  Initial Impression / Assessment and Plan / UC Course  I have reviewed the triage vital signs and the nursing notes.  Pertinent labs & imaging results that were available during my care of the patient were reviewed by me and considered in my medical decision making (see chart for details).     Suspect bacterial sinusitis secondary to seasonal allergies.  Will treat with Augmentin , Astelin , antihistamines, supportive over-the-counter medications and home care.  Return for worsening symptoms.  Final Clinical Impressions(s) / UC Diagnoses   Final diagnoses:  Acute frontal sinusitis, recurrence not specified  Seasonal allergic rhinitis due to other allergic trigger  Sinus headache     Discharge Instructions      In addition to the prescribed medications, continue taking the Zyrtec  daily, and use saline sinus rinses 1-2 times daily until feeling much better.  If needed, you may also take Coricidin HBP to help with sinus pressure and congestion    ED Prescriptions     Medication Sig Dispense Auth. Provider   amoxicillin -clavulanate (AUGMENTIN ) 875-125 MG tablet Take 1 tablet by mouth every 12 (twelve) hours. 20 tablet Stuart Vernell Norris, PA-C  azelastine  (ASTELIN ) 0.1 % nasal spray Place 1 spray into both nostrils 2 (two) times daily. Use in each nostril as directed 30 mL Stuart Vernell Norris, PA-C      PDMP not reviewed this encounter.   Stuart Vernell Norris,  [place] 01/18/24 1422

## 2024-01-24 ENCOUNTER — Ambulatory Visit: Admitting: Gastroenterology

## 2024-01-24 VITALS — BP 125/73 | HR 52 | Temp 98.6°F | Ht 75.0 in | Wt 188.0 lb

## 2024-01-24 DIAGNOSIS — Z860101 Personal history of adenomatous and serrated colon polyps: Secondary | ICD-10-CM

## 2024-01-24 DIAGNOSIS — K219 Gastro-esophageal reflux disease without esophagitis: Secondary | ICD-10-CM | POA: Diagnosis not present

## 2024-01-24 NOTE — Patient Instructions (Signed)
 We will see you back in year!  Continue to take pantoprazole  30 minutes before breakfast each morning.   Call us  if any concerns!  I enjoyed seeing you again today! I value our relationship and want to provide genuine, compassionate, and quality care. You may receive a survey regarding your visit with me, and I welcome your feedback! Thanks so much for taking the time to complete this. I look forward to seeing you again.      Therisa MICAEL Stager, PhD, ANP-BC [facility]

## 2024-01-24 NOTE — Progress Notes (Signed)
 Gastroenterology Office Note     Primary Care Physician:  Billy Philippe SAUNDERS, NP  Primary Gastroenterologist: Dr. Cindie   Chief Complaint   Chief Complaint  Patient presents with   Follow-up    FOLLOW UP on GERD and acid reflux. No other problems     History of Present Illness   Steven Glover is a 73 y.o. male presenting today with a history of  GERD, large sliding hiatal hernia, asymptomatic panniculitis with last CT in Feb 2024 and no need for surveillance unless symptomatic, returning today for routine follow-up.  No abdominal pain, N/V, changes in bowel habits, constipation, diarrhea, overt GI bleeding, uncontrolled GERD, dysphagia, unexplained weight loss, lack of appetite, unexplained weight gain.   He has been able to wean down pantoprazole  to just once per day (previously BID).    Colonoscopy Dec 2021: two 3-5 mm polyps in transverse and ascending colon, one 2 mm polyp in transverse, surveillance 5 years. Path: tubular adenomas.    EGD Aug 2012: mild chronic gastritis, stricture distal esophagus, hiatal hernia  Past Medical History:  Diagnosis Date   Acid reflux disease    Allergy    Asthma    as child   Atrial fibrillation (HCC)    BMI between 19-24,adult JUL 2012 191.8 LBS   Collagen vascular disease (HCC)    GERD (gastroesophageal reflux disease)    Internal hemorrhoids with other complication    TCS DEC 2013   Presence of Watchman left atrial appendage closure device 02/17/2023   31mm Watchman FLX Pro placed by Dr. Cindie   Pulmonary nodule 2013   [facility] - Repeat 08/2012   Rheumatoid arthritis(714.0)    Skin cancer     Past Surgical History:  Procedure Laterality Date   ATRIAL FIBRILLATION ABLATION N/A 12/21/2022   Procedure: ATRIAL FIBRILLATION ABLATION;  Surgeon: Cindie Ole DASEN, MD;  [facility]: [facility];  Service: Cardiovascular;  Laterality: N/A;   COLONOSCOPY  2009 NUR [facility]   MULTIPLE SIMPLE ADENOMAS   COLONOSCOPY N/A  05/02/2017   Dr. harvey: 4 simple adenomas removed, diverticulosis.  Next colonoscopy in 3 years.   COLONOSCOPY W/ POLYPECTOMY  2010 NUR   COLONOSCOPY WITH PROPOFOL   05/15/2012   SLF:Two sessile polyps ranging between 3-8mm in size were found in the ascending colon and rectum; multiple biopsies were performed/Mild diverticulosis was noted in the sigmoid colon/The colon mucosa was otherwise normal/ Moderate sized internal hemorrhoids. TCS 04/2017.   COLONOSCOPY WITH PROPOFOL  N/A 04/29/2020   Procedure: COLONOSCOPY WITH PROPOFOL ;  Surgeon: Cindie Carlin POUR, DO;  [facility];  Service: Endoscopy;  Laterality: N/A;  7:30am   ESOPHAGOGASTRODUODENOSCOPY  01/01/2011 Ar:FPOI CHRONIC GASTRITIS   SLF: Hiatal Hernia/mild gastritis/stricture in the distal esophagus   FOOT SURGERY Left    LEFT ATRIAL APPENDAGE OCCLUSION N/A 02/17/2023   Procedure: LEFT ATRIAL APPENDAGE OCCLUSION;  Surgeon: Cindie Ole DASEN, MD;  [facility];  Service: Cardiovascular;  Laterality: N/A;   POLYPECTOMY  05/15/2012   SIMPLE ADENOMA(2)   POLYPECTOMY  04/29/2020   Procedure: POLYPECTOMY;  Surgeon: Cindie Carlin POUR, DO;  [facility];  Service: Endoscopy;;   SAVORY DILATION  01/01/2011   Procedure: SAVORY DILATION;  Surgeon: Margo CHRISTELLA harvey, MD;  [facility];  Service: Endoscopy;  Laterality: N/A;   SHOULDER OPEN ROTATOR CUFF REPAIR  2005   TEE WITHOUT CARDIOVERSION N/A 02/17/2023   Procedure: TRANSESOPHAGEAL ECHOCARDIOGRAM;  Surgeon: Cindie Ole DASEN, MD;  Location:  [facility];  Service: Cardiovascular;  Laterality: N/A;   WRIST FUSION      Current Outpatient Medications  Medication Sig Dispense Refill   amiodarone  (PACERONE ) 200 MG tablet Take 1 tablet (200 mg total) by mouth daily. 90 tablet 3   amoxicillin -clavulanate (AUGMENTIN ) 875-125 MG tablet Take 1 tablet by mouth every 12 (twelve) hours. 20 tablet 0   azelastine  (ASTELIN ) 0.1 % nasal spray Place 1  spray into both nostrils 2 (two) times daily. Use in each nostril as directed 30 mL 0   clopidogrel  (PLAVIX ) 75 MG tablet Take 75 mg by mouth daily.     ezetimibe  (ZETIA ) 10 MG tablet Take 1 tablet (10 mg total) by mouth daily. 90 tablet 1   fexofenadine (ALLEGRA) 180 MG tablet Take 180 mg by mouth daily.     folic acid (FOLVITE) 1 MG tablet Take 1 mg by mouth daily.       Golimumab (SIMPONI ARIA IV) Inject into the vein. Every 8 weeks     methotrexate 2.5 MG tablet Take 17.5 mg by mouth every Monday. 7 tablets-Monday     pantoprazole  (PROTONIX ) 40 MG tablet Take 1 tablet (40 mg total) by mouth 2 (two) times daily. 180 tablet 3   cetirizine  (ZYRTEC ) 10 MG tablet Take 10 mg by mouth daily as needed for allergies. (Patient not taking: Reported on 01/24/2024)     No current facility-administered medications for this visit.    Allergies as of 01/24/2024 - Review Complete 01/24/2024  Allergen Reaction Noted   Cefprozil   02/21/2015   Statins  11/30/2018    Family History  Problem Relation Age of Onset   Heart disease Mother        Leaky Heart Valve   Diabetes Father    Cancer Sister        breast   Cancer Brother        intestines   Congestive Heart Failure Brother    Crohn's disease Daughter    Cancer Maternal Grandmother    Colon cancer Neg Hx    Colon polyps Neg Hx     Social History   Socioeconomic History   Marital status: Married    Spouse name: Not on file   Number of children: 2   Years of education: Not on file   Highest education level: High school graduate  Occupational History   Not on file  Tobacco Use   Smoking status: Never    Passive exposure: Never   Smokeless tobacco: Never   Tobacco comments:    Never smoked 01/18/23  Vaping Use   Vaping status: Never Used  Substance and Sexual Activity   Alcohol use: No   Drug use: No   Sexual activity: Not Currently    Birth control/protection: None  Other Topics Concern   Not on file  Social History  Narrative   MARRIED-Daughter, DEVERE BALLOON, nurse ([employer]), 2 CHILDREN   Social Drivers of Health   Financial Resource Strain: Low Risk  (10/26/2023)   Overall Financial Resource Strain (CARDIA)    Difficulty of Paying Living Expenses: Not hard at all  Food Insecurity: No Food Insecurity (10/26/2023)   Hunger Vital Sign    Worried About Running Out of Food in the Last Year: Never true    Ran Out of Food in the Last Year: Never true  Transportation Needs: No Transportation Needs (10/26/2023)   PRAPARE - Transportation    Lack of Transportation (Medical): No    Lack  of Transportation (Non-Medical): No  Physical Activity: Inactive (10/26/2023)   Exercise Vital Sign    Days of Exercise per Week: 0 days    Minutes of Exercise per Session: 0 min  Stress: No Stress Concern Present (10/26/2023)   [employer] of Occupational Health - Occupational Stress Questionnaire    Feeling of Stress : Not at all  Social Connections: Socially Integrated (10/26/2023)   Social Connection and Isolation Panel    Frequency of Communication with Friends and Family: More than three times a week    Frequency of Social Gatherings with Friends and Family: More than three times a week    Attends Religious Services: More than 4 times per year    Active Member of [employer] or Organizations: Yes    Attends Engineer, structural: More than 4 times per year    Marital Status: Married  Catering manager Violence: Not At Risk (10/26/2023)   Humiliation, Afraid, Rape, and Kick questionnaire    Fear of Current or Ex-Partner: No    Emotionally Abused: No    Physically Abused: No    Sexually Abused: No     Review of Systems   Gen: Denies any fever, chills, fatigue, weight loss, lack of appetite.  CV: Denies chest pain, heart palpitations, peripheral edema, syncope.  Resp: Denies shortness of breath at rest or with exertion. Denies wheezing or cough.  GI: Denies dysphagia or odynophagia. Denies jaundice, hematemesis,  fecal incontinence. GU : Denies urinary burning, urinary frequency, urinary hesitancy MS: Denies joint pain, muscle weakness, cramps, or limitation of movement.  Derm: Denies rash, itching, dry skin Psych: Denies depression, anxiety, memory loss, and confusion Heme: Denies bruising, bleeding, and enlarged lymph nodes.   Physical Exam   BP 125/73   Pulse (!) 52   Temp 98.6 F (37 C)   Ht 6' 3 (1.905 m)   Wt 188 lb (85.3 kg)   BMI 23.50 kg/m  General:   Alert and oriented. Pleasant and cooperative. Well-nourished and well-developed.  Head:  Normocephalic and atraumatic. Eyes:  Without icterus Abdomen:  +BS, soft, non-tender and non-distended. No HSM noted. No guarding or rebound. No masses appreciated.  Rectal:  Deferred  Msk:  Symmetrical without gross deformities. Normal posture. Extremities:  Without edema. Neurologic:  Alert and  oriented x4;  grossly normal neurologically. Skin:  Intact without significant lesions or rashes. Psych:  Alert and cooperative. Normal mood and affect.   Assessment   Steven Glover is a 72 y.o. male presenting today with a history of GERD, large sliding hiatal hernia, asymptomatic panniculitis with last CT in Feb 2024 and no need for surveillance unless symptomatic, returning today for routine follow-up.  GERD well controlled on once daily PPI without alarm signs/symptoms.   No concerning lower GI sign/symptoms.  Colonoscopy will be due in 2026 if health permits.      PLAN    Continue PPI daily Return in 1 year Call if any concerns in the interim   Therisa MICAEL Stager, PhD, ANP-BC [facility]

## 2024-01-27 ENCOUNTER — Ambulatory Visit
Admission: RE | Admit: 2024-01-27 | Discharge: 2024-01-27 | Disposition: A | Discharge status: Home / Self Care | Payer: Self-pay | Attending: Family Medicine | Admitting: Family Medicine

## 2024-01-27 VITALS — BP 114/68 | HR 54 | Temp 97.6°F | Resp 16

## 2024-01-27 DIAGNOSIS — J0100 Acute maxillary sinusitis, unspecified: Secondary | ICD-10-CM

## 2024-01-27 MED ORDER — DOXYCYCLINE HYCLATE 100 MG PO CAPS: 100.0000 mg | ORAL_CAPSULE | Freq: Two times a day (BID) | ORAL | 0 refills | Status: DC

## 2024-01-27 MED ORDER — DEXAMETHASONE SODIUM PHOSPHATE 10 MG/ML IJ SOLN
10.0000 mg | Freq: Once | INTRAMUSCULAR | Status: AC
  Administered 2024-01-27: 10 mg via INTRAMUSCULAR

## 2024-01-27 NOTE — ED Triage Notes (Signed)
 Pt reports he has a runny nose, sneezing, fatigue x 1 week   Took meds prescribed from last visit but no relief.

## 2024-01-27 NOTE — Discharge Instructions (Signed)
 I have prescribed a different antibiotic and we have given you a steroid shot today for pain and inflammation.  Continue your nasal sprays, saline sinus rinses and other over-the-counter remedies as well.  Follow-up for worsening or unresolving symptoms.

## 2024-01-27 NOTE — ED Provider Notes (Signed)
 [facility]    CSN: [id number] Arrival date & time: 01/27/24  1252      History   Chief Complaint Chief Complaint  Patient presents with   Nasal Congestion    Entered by patient    HPI Steven Glover is a 73 y.o. male.   Patient presenting today with 1 week history of runny nose, sneezing, fatigue, sinus pain and pressure.  Was treated for sinusitis with Augmentin  last week, thought he was getting a bit better for a few days but then symptoms progressively have worsened since.  Still been taking the Astelin  and antihistamines as well as over-the-counter remedies all with minimal relief.  Denies fever, chills, chest pain, shortness of breath, vomiting, diarrhea.    Past Medical History:  Diagnosis Date   Acid reflux disease    Allergy    Asthma    as child   Atrial fibrillation (HCC)    BMI between 19-24,adult JUL 2012 191.8 LBS   Collagen vascular disease (HCC)    GERD (gastroesophageal reflux disease)    Internal hemorrhoids with other complication    TCS DEC 2013   Presence of Watchman left atrial appendage closure device 02/17/2023   31mm Watchman FLX Pro placed by Dr. 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   Aortic atherosclerosis (HCC) 08/30/2022   Hiatal hernia 03/01/2022   Abnormal CT scan, gastrointestinal tract 03/01/2022   Myalgia due to statin 06/12/2021   Hx of colonic polyps    Thrombocytopenia (HCC) 06/23/2016   BPH (benign prostatic hyperplasia) 01/28/2016   Dyslipidemia (high LDL; low HDL)  01/28/2016   Internal hemorrhoids with complication 12/14/2012   GERD (gastroesophageal reflux disease) 04/21/2011   Colon cancer screening 04/21/2011   Dysphagia 12/30/2010    Past Surgical History:  Procedure Laterality Date   ATRIAL FIBRILLATION ABLATION N/A 12/21/2022   Procedure: ATRIAL FIBRILLATION ABLATION;  Surgeon: Cindie Ole DASEN, MD;  [facility];  Service: Cardiovascular;  Laterality: N/A;   COLONOSCOPY  2009 NUR [facility]   MULTIPLE SIMPLE ADENOMAS   COLONOSCOPY N/A 05/02/2017   Dr. harvey: 4 simple adenomas removed, diverticulosis.  Next colonoscopy in 3 years.   COLONOSCOPY W/ POLYPECTOMY  2010 NUR   COLONOSCOPY WITH PROPOFOL   05/15/2012   SLF:Two sessile polyps ranging between 3-31mm in size were found in the ascending colon and rectum; multiple biopsies were performed/Mild diverticulosis was noted in the sigmoid colon/The colon mucosa was otherwise normal/ Moderate sized internal hemorrhoids. TCS 04/2017.   COLONOSCOPY WITH PROPOFOL  N/A 04/29/2020   Procedure: COLONOSCOPY WITH PROPOFOL ;  Surgeon: Cindie Carlin POUR, DO;  [facility];  Service: Endoscopy;  Laterality: N/A;  7:30am   ESOPHAGOGASTRODUODENOSCOPY  01/01/2011 Ar:FPOI CHRONIC GASTRITIS   SLF: Hiatal Hernia/mild gastritis/stricture in the distal esophagus   FOOT SURGERY Left    LEFT ATRIAL APPENDAGE OCCLUSION N/A 02/17/2023   Procedure: LEFT ATRIAL APPENDAGE OCCLUSION;  Surgeon: Cindie Ole DASEN, MD;  [facility]: [facility];  Service: Cardiovascular;  Laterality: N/A;   POLYPECTOMY  05/15/2012   SIMPLE  [id number](7)   POLYPECTOMY  04/29/2020   Procedure: POLYPECTOMY;  Surgeon: Cindie Carlin POUR, DO;  [facility];  Service: Endoscopy;;   SAVORY DILATION  01/01/2011   Procedure: SAVORY DILATION;  Surgeon: Margo CHRISTELLA Haddock, MD;  [facility]: [facility];  Service: Endoscopy;  Laterality: N/A;   SHOULDER OPEN ROTATOR CUFF REPAIR  2005   TEE WITHOUT CARDIOVERSION N/A 02/17/2023    Procedure: TRANSESOPHAGEAL ECHOCARDIOGRAM;  Surgeon: Cindie Ole DASEN, MD;  Location: [facility]AS[facility] CV [facility];  Service: Cardiovascular;  Laterality: N/A;   WRIST FUSION         Home Medications    Prior to Admission medications   Medication Sig Start Date End Date Taking? Authorizing Provider  doxycycline  (VIBRAMYCIN ) 100 MG capsule Take 1 capsule (100 mg total) by mouth 2 (two) times daily. 01/27/24  Yes Stuart Vernell Norris, PA-C  amiodarone  (PACERONE ) 200 MG tablet Take 1 tablet (200 mg total) by mouth daily. 08/22/23   Okey Vina GAILS, MD  amoxicillin -clavulanate (AUGMENTIN ) 875-125 MG tablet Take 1 tablet by mouth every 12 (twelve) hours. 01/18/24   Stuart Vernell Norris, PA-C  azelastine  (ASTELIN ) 0.1 % nasal spray Place 1 spray into both nostrils 2 (two) times daily. Use in each nostril as directed 01/18/24   Stuart Vernell Norris, PA-C  clopidogrel  (PLAVIX ) 75 MG tablet Take 75 mg by mouth daily.    [provider]  ezetimibe  (ZETIA ) 10 MG tablet Take 1 tablet (10 mg total) by mouth daily. 08/31/23   Williamson, Joanna R, NP  fexofenadine (ALLEGRA) 180 MG tablet Take 180 mg by mouth daily.    [provider]  folic acid (FOLVITE) 1 MG tablet Take 1 mg by mouth daily.      [provider]  Golimumab (SIMPONI ARIA IV) Inject into the vein. Every 8 weeks    [provider]  methotrexate 2.5 MG tablet Take 17.5 mg by mouth every Monday. 7 tablets-Monday    [provider]  pantoprazole  (PROTONIX ) 40 MG tablet Take 1 tablet (40 mg total) by mouth 2 (two) times daily. Patient taking differently: Take 40 mg by mouth daily. 07/07/23 07/06/24  Shirlean Therisa ORN, NP    Family History Family History  Problem Relation Age of Onset   Heart disease Mother        Leaky Heart Valve   Diabetes Father    Cancer Sister        breast   Cancer Brother        intestines   Congestive Heart Failure Brother    Crohn's disease Daughter    Cancer Maternal  Grandmother    Colon cancer Neg Hx    Colon polyps Neg Hx     Social History Social History   Tobacco Use   Smoking status: Never    Passive exposure: Never   Smokeless tobacco: Never   Tobacco comments:    Never smoked 01/18/23  Vaping Use   Vaping status: Never Used  Substance Use Topics   Alcohol use: No   Drug use: No     Allergies   Cefprozil  and Statins   Review of Systems Review of Systems PER HPI  Physical Exam Triage Vital Signs ED Triage Vitals [01/27/24 1305]  Encounter Vitals Group     BP 114/68     Girls Systolic BP Percentile      Girls Diastolic BP Percentile      Boys Systolic BP Percentile      Boys Diastolic BP  Percentile      Pulse Rate (!) 54     Resp 16     Temp 97.6 F (36.4 C)     Temp Source Oral     SpO2 98 %     Weight      Height      Head Circumference      Peak Flow      Pain Score 0     Pain Loc      Pain Education      Exclude from Growth Chart    No data found.  Updated Vital Signs BP 114/68 (BP Location: Left Arm)   Pulse (!) 54   Temp 97.6 F (36.4 C) (Oral)   Resp 16   SpO2 98%   Visual Acuity Right Eye Distance:   Left Eye Distance:   Bilateral Distance:    Right Eye Near:   Left Eye Near:    Bilateral Near:     Physical Exam Vitals and nursing note reviewed.  Constitutional:      Appearance: He is well-developed.  HENT:     Head: Atraumatic.     Right Ear: External ear normal.     Left Ear: External ear normal.     Nose: Congestion present.     Mouth/Throat:     Pharynx: Posterior oropharyngeal erythema present. No oropharyngeal exudate.  Eyes:     Conjunctiva/sclera: Conjunctivae normal.     Pupils: Pupils are equal, round, and reactive to light.  Cardiovascular:     Rate and Rhythm: Normal rate and regular rhythm.  Pulmonary:     Effort: Pulmonary effort is normal. No respiratory distress.     Breath sounds: No wheezing or rales.  Musculoskeletal:        General: Normal range of motion.      Cervical back: Normal range of motion and neck supple.  Lymphadenopathy:     Cervical: No cervical adenopathy.  Skin:    General: Skin is warm and dry.  Neurological:     Mental Status: He is alert and oriented to person, place, and time.  Psychiatric:        Behavior: Behavior normal.      UC Treatments / Results  Labs (all labs ordered are listed, but only abnormal results are displayed) Labs Reviewed - No data to display  EKG   Radiology No results found.  Procedures Procedures (including critical care time)  Medications Ordered in UC Medications  dexamethasone  (DECADRON ) injection 10 mg (10 mg Intramuscular Given 01/27/24 1348)    Initial Impression / Assessment and Plan / UC Course  I have reviewed the triage vital signs and the nursing notes.  Pertinent labs & imaging results that were available during my care of the patient were reviewed by me and considered in my medical decision making (see chart for details).     Given duration worsening course and lack of resolution on Augmentin , will start doxycycline , IM Decadron  which he states he has done well with in the past for this and continue allergy regimen, Astelin , saline sinus rinses and other remedies over-the-counter.  Return for worsening symptoms.  Final Clinical Impressions(s) / UC Diagnoses   Final diagnoses:  Acute maxillary sinusitis, recurrence not specified     Discharge Instructions      I have prescribed a different antibiotic and we have given you a steroid shot today for pain and inflammation.  Continue your nasal sprays, saline sinus rinses and other over-the-counter remedies as  well.  Follow-up for worsening or unresolving symptoms.    ED Prescriptions     Medication Sig Dispense Auth. Provider   doxycycline  (VIBRAMYCIN ) 100 MG capsule Take 1 capsule (100 mg total) by mouth 2 (two) times daily. 20 capsule Stuart Vernell Norris, [place]      PDMP not reviewed this encounter.    Stuart Vernell Norris, [place] 01/27/24 1404

## 2024-01-30 ENCOUNTER — Encounter: Payer: Self-pay | Admitting: Cardiovascular Disease

## 2024-01-30 ENCOUNTER — Ambulatory Visit: Attending: Cardiovascular Disease | Admitting: Cardiovascular Disease

## 2024-01-30 VITALS — BP 124/70 | HR 52 | Ht 75.0 in | Wt 194.4 lb

## 2024-01-30 DIAGNOSIS — E782 Mixed hyperlipidemia: Secondary | ICD-10-CM

## 2024-01-30 DIAGNOSIS — I480 Paroxysmal atrial fibrillation: Secondary | ICD-10-CM | POA: Diagnosis present

## 2024-01-30 DIAGNOSIS — Z5181 Encounter for therapeutic drug level monitoring: Secondary | ICD-10-CM

## 2024-01-30 DIAGNOSIS — I4891 Unspecified atrial fibrillation: Secondary | ICD-10-CM | POA: Diagnosis present

## 2024-01-30 DIAGNOSIS — Z9889 Other specified postprocedural states: Secondary | ICD-10-CM | POA: Diagnosis present

## 2024-01-30 DIAGNOSIS — Z79899 Other long term (current) drug therapy: Secondary | ICD-10-CM | POA: Diagnosis present

## 2024-01-30 DIAGNOSIS — G4733 Obstructive sleep apnea (adult) (pediatric): Secondary | ICD-10-CM

## 2024-01-30 DIAGNOSIS — Z95818 Presence of other cardiac implants and grafts: Secondary | ICD-10-CM | POA: Diagnosis present

## 2024-01-30 MED ORDER — AMIODARONE HCL 100 MG PO TABS: 100.0000 mg | ORAL_TABLET | Freq: Every day | ORAL | 3 refills | Status: AC

## 2024-01-30 NOTE — Patient Instructions (Signed)
 Medication Instructions:  Decrease Amiodarone  to 100 mg  *If you need a refill on your cardiac medications before your next appointment, please call your pharmacy*  Lab Work: TSH with next labs If you have labs (blood work) drawn today and your tests are completely normal, you will receive your results only by: MyChart Message (if you have MyChart) OR A paper copy in the mail If you have any lab test that is abnormal or we need to change your treatment, we will call you to review the results.  Testing/Procedures: None ordered  Follow-Up: At [facility], you and your health needs are our priority.  As part of our continuing mission to provide you with exceptional heart care, our providers are all part of one team.  This team includes your primary Cardiologist (physician) and Advanced Practice Providers or APPs (Physician Assistants and Nurse Practitioners) who all work together to provide you with the care you need, when you need it.  Your next appointment:   Dr Cindie- 3 months  Dr Francyne- 1 year  We recommend signing up for the patient portal called MyChart.  Sign up information is provided on this After Visit Summary.  MyChart is used to connect with patients for Virtual Visits (Telemedicine).  Patients are able to view lab/test results, encounter notes, upcoming appointments, etc.  Non-urgent messages can be sent to your provider as well.   To learn more about what you can do with MyChart, go to [email or web address].

## 2024-02-03 NOTE — Progress Notes (Signed)
 Cardiology Office Note:    Date:  02/03/2024   ID:  Steven Glover, DOB Jul 25, 1950, MRN [id number]  PCP:  Billy Philippe SAUNDERS, NP   [facility] Providers Cardiologist:  Jerel Balding, MD Electrophysiologist:  OLE ONEIDA HOLTS, MD     Referring MD: Billy Philippe SAUNDERS, NP   Chief Complaint  Patient presents with   Atrial Fibrillation  Transition of general cardiology care from Dr. Okey  History of Present Illness:    Steven Glover is a 73 y.o. 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  He underwent atrial fibrillation ablation in July 2020 and implantation of a Watchman device in September 2024.  He is no longer on anticoagulants, but is still taking amiodarone .  He is physically active and denies problems of shortness of breath or chest pain at rest or with physical activity.  He has not been troubled by palpitations.  He denies dizziness, syncope, orthopnea, PND, leg edema or claudication. Despite the fact that he does not have diabetes mellitus and is quite lean, he has a very low HDL cholesterol of 29 and elevated triglycerides 316.  On ezetimibe  therapy he has an excellent LDL cholesterol of 44.  He is compliant with CPAP and denies daytime hypersomnolence.  Past Medical History:  Diagnosis Date   Acid reflux disease    Allergy    Asthma    as child   Atrial fibrillation (HCC)    BMI between 19-24,adult JUL 2012 191.8 LBS   Collagen vascular disease (HCC)    GERD (gastroesophageal reflux disease)    Internal hemorrhoids with other complication    TCS DEC 2013   Presence of Watchman left atrial appendage closure device 02/17/2023   31mm Watchman FLX Pro placed by Dr. HOLTS   Pulmonary nodule 2013   [facility] - Repeat 08/2012   Rheumatoid  arthritis(714.0)    Skin cancer     Past Surgical History:  Procedure Laterality Date   ATRIAL FIBRILLATION ABLATION N/A 12/21/2022   Procedure: ATRIAL FIBRILLATION ABLATION;  Surgeon: HOLTS OLE ONEIDA, MD;  [facility]: [facility];  Service: Cardiovascular;  Laterality: N/A;   COLONOSCOPY  2009 NUR [facility]   MULTIPLE SIMPLE ADENOMAS   COLONOSCOPY N/A 05/02/2017   Dr. harvey: 4 simple adenomas removed, diverticulosis.  Next colonoscopy in 3 years.   COLONOSCOPY W/ POLYPECTOMY  2010 NUR   COLONOSCOPY WITH PROPOFOL   05/15/2012   SLF:Two sessile polyps ranging between 3-42mm in size were found in the ascending colon and rectum; multiple biopsies were performed/Mild diverticulosis was noted in the sigmoid colon/The colon mucosa was otherwise normal/ Moderate sized internal hemorrhoids. TCS 04/2017.   COLONOSCOPY WITH PROPOFOL  N/A 04/29/2020   Procedure: COLONOSCOPY WITH PROPOFOL ;  Surgeon: Cindie Carlin POUR, DO;  [facility];  Service: Endoscopy;  Laterality: N/A;  7:30am   ESOPHAGOGASTRODUODENOSCOPY  01/01/2011 Ar:FPOI CHRONIC GASTRITIS   SLF: Hiatal Hernia/mild gastritis/stricture in the distal esophagus   FOOT SURGERY Left    LEFT ATRIAL APPENDAGE OCCLUSION N/A 02/17/2023   Procedure: LEFT ATRIAL APPENDAGE OCCLUSION;  Surgeon: HOLTS OLE ONEIDA, MD;  [facility]: [facility];  Service: Cardiovascular;  Laterality: N/A;   POLYPECTOMY  05/15/2012   SIMPLE ADENOMA(2)   POLYPECTOMY  04/29/2020   Procedure: POLYPECTOMY;  Surgeon: Cindie Carlin POUR, DO;  [facility];  Service: Endoscopy;;   SAVORY DILATION  01/01/2011   Procedure: SAVORY DILATION;  Surgeon: Margo CHRISTELLA Haddock, MD;  [facility];  Service: Endoscopy;  Laterality: N/A;   SHOULDER OPEN ROTATOR CUFF REPAIR  2005   TEE WITHOUT CARDIOVERSION N/A 02/17/2023   Procedure: TRANSESOPHAGEAL ECHOCARDIOGRAM;  Surgeon: Cindie Ole DASEN, MD;  Location: [facility] INVASIVE CV LAB;  Service: Cardiovascular;   Laterality: N/A;   WRIST FUSION      Current Medications: Current Meds  Medication Sig   ezetimibe  (ZETIA ) 10 MG tablet Take 1 tablet (10 mg total) by mouth daily.   fexofenadine (ALLEGRA) 180 MG tablet Take 180 mg by mouth daily.   folic acid (FOLVITE) 1 MG tablet Take 1 mg by mouth daily.     Golimumab (SIMPONI ARIA IV) Inject into the vein. Every 8 weeks   methotrexate 2.5 MG tablet Take 17.5 mg by mouth every Monday. 7 tablets-Monday   pantoprazole  (PROTONIX ) 40 MG tablet Take 1 tablet (40 mg total) by mouth 2 (two) times daily.   [DISCONTINUED] amiodarone  (PACERONE ) 200 MG tablet Take 1 tablet (200 mg total) by mouth daily.     Allergies:   Cefprozil  and Statins   Social History   Socioeconomic History   Marital status: Married    Spouse name: Not on file   Number of children: 2   Years of education: Not on file   Highest education level: High school graduate  Occupational History   Not on file  Tobacco Use   Smoking status: Never    Passive exposure: Never   Smokeless tobacco: Never   Tobacco comments:    Never smoked 01/18/23  Vaping Use   Vaping status: Never Used  Substance and Sexual Activity   Alcohol use: No   Drug use: No   Sexual activity: Not Currently    Birth control/protection: None  Other Topics Concern   Not on file  Social History Narrative   MARRIED-Daughter, DEVERE BALLOON, nurse ([employer]), 2 CHILDREN   Social Drivers of Health   Financial Resource Strain: Low Risk  (10/26/2023)   Overall Financial Resource Strain (CARDIA)    Difficulty of Paying Living Expenses: Not hard at all  Food Insecurity: No Food Insecurity (10/26/2023)   Hunger Vital Sign    Worried About Running Out of Food in the Last Year: Never true    Ran Out of Food in the Last Year: Never true  Transportation Needs: No Transportation Needs (10/26/2023)   PRAPARE - Administrator, Civil Service (Medical): No    Lack of Transportation (Non-Medical): No  Physical  Activity: Inactive (10/26/2023)   Exercise Vital Sign    Days of Exercise per Week: 0 days    Minutes of Exercise per Session: 0 min  Stress: No Stress Concern Present (10/26/2023)   [employer] of Occupational Health - Occupational Stress Questionnaire    Feeling of Stress : Not at all  Social Connections: Socially Integrated (10/26/2023)   Social Connection and Isolation Panel    Frequency of Communication with Friends and Family: More than three times a week    Frequency of Social Gatherings with Friends and Family: More than three times a week    Attends Religious Services: More than 4 times per year    Active Member of [employer] or Organizations: Yes    Attends Engineer, structural: More than 4 times per year    Marital Status: Married     Family History: The  patient's family history includes Cancer in his brother, maternal grandmother, and sister; Congestive Heart Failure in his brother; Crohn's disease in his daughter; Diabetes in his father; Heart disease in his mother. There is no history of Colon cancer or Colon polyps.  ROS:   Please see the history of present illness.     All other systems reviewed and are negative.  EKGs/Labs/Other Studies Reviewed:    The following studies were reviewed today: Personally reviewed ECG from 11/01/2023 which shows sinus bradycardia at 50 bpm and is otherwise a normal tracing.      Recent Labs: 08/31/2023: ALT 29; BUN 14; Creatinine, Ser 1.25; Potassium 4.5; Sodium 137  Recent Lipid Panel    Component Value Date/Time   CHOL 137 08/31/2023 1048   CHOL 125 08/31/2022 0809   TRIG 316.0 (H) 08/31/2023 1048   HDL 29.50 (L) 08/31/2023 1048   HDL 31 (L) 08/31/2022 0809   CHOLHDL 5 08/31/2023 1048   VLDL 63.2 (H) 08/31/2023 1048   LDLCALC 44 08/31/2023 1048   LDLCALC 67 08/31/2022 0809     Risk Assessment/Calculations:    CHA2DS2-VASc Score =   s/p Watchman device implantation  This indicates a  % annual risk of stroke. The  patient's score is based upon:             Physical Exam:    VS:  BP 124/70 (BP Location: Left Arm, Patient Position: Sitting, Cuff Size: Normal)   Pulse (!) 52   Ht 6' 3 (1.905 m)   Wt 194 lb 6.4 oz (88.2 kg)   SpO2 99%   BMI 24.30 kg/m     Wt Readings from Last 3 Encounters:  01/30/24 194 lb 6.4 oz (88.2 kg)  01/24/24 188 lb (85.3 kg)  11/01/23 195 lb 9.6 oz (88.7 kg)     GEN: Lean and fit, appears younger than stated age, well nourished, well developed in no acute distress HEENT: Normal NECK: No JVD; No carotid bruits LYMPHATICS: No lymphadenopathy CARDIAC: RRR, faint aortic ejection murmur 1/6 and early peaking, no diastolic murmurs, rubs, gallops RESPIRATORY:  Clear to auscultation without rales, wheezing or rhonchi  ABDOMEN: Soft, non-tender, non-distended MUSCULOSKELETAL:  No edema; No deformity  SKIN: Warm and dry NEUROLOGIC:  Alert and oriented x 3 PSYCHIATRIC:  Normal affect   ASSESSMENT:    1. Encounter for monitoring amiodarone  therapy   2. Paroxysmal atrial fibrillation (HCC)   3. History of cardiac ablation for atrial fibrillation (HCC)   4. Presence of Watchman left atrial appendage closure device   5. OSA (obstructive sleep apnea)   6. Mixed hyperlipidemia    PLAN:    In order of problems listed above:  A-fib: He has not had clinically evident atrial fibrillation in years, but still takes amiodarone .  At increased risk for liver abnormality since he also takes methotrexate for rheumatoid arthritis.  Does not require anticoagulation after his Watchman device has been proven to seal the appendage well.  Will decrease the amiodarone  to 100 mg daily and if he does not have recurrent arrhythmia in the next several months consider discontinuing this medication altogether.  That decision can be taken at his next appointment with Dr. Cindie in December.  He gets frequent monitoring of LFTs via the rheumatology clinic, but we also need to recheck his  TSH. HLP: His metabolic profile suggests insulin resistance, despite the fact that he is quite lean.  He has elevated triglycerides and a very low HDL.  Encouraged more physical activity.  Avoid sweets and starches with high glycemic index, increasing intake of unsaturated fat and lean protein.  Has excellent LDL cholesterol, no reason to add lipid-lowering therapy at this point. OSA: Moderate by sleep study a year ago (supine AHI 23, nonsupine AHI 6.2/hour).  Feels that his sleep is now restful and overall has more energy since he began using CPAP.  With Dr. Gabe retirement, plan to transition his CPAP monitoring to Dr. Shlomo. HTN: Excellent blood pressure today although he is not taking any medications.  I wonder if this was not an accurate diagnosis or if the improvement can be attributed to treatment of his sleep apnea          Medication Adjustments/Labs and Tests Ordered: Current medicines are reviewed at length with the patient today.  Concerns regarding medicines are outlined above.  Orders Placed This Encounter  Procedures   TSH   Ambulatory referral to Cardiology   Meds ordered this encounter  Medications   amiodarone  (PACERONE ) 100 MG tablet    Sig: Take 1 tablet (100 mg total) by mouth daily.    Dispense:  90 tablet    Refill:  3    Patient Instructions  Medication Instructions:  Decrease Amiodarone  to 100 mg  *If you need a refill on your cardiac medications before your next appointment, please call your pharmacy*  Lab Work: TSH with next labs If you have labs (blood work) drawn today and your tests are completely normal, you will receive your results only by: MyChart Message (if you have MyChart) OR A paper copy in the mail If you have any lab test that is abnormal or we need to change your treatment, we will call you to review the results.  Testing/Procedures: None ordered  Follow-Up: At [facility], you and your health needs are our priority.  As  part of our continuing mission to provide you with exceptional heart care, our providers are all part of one team.  This team includes your primary Cardiologist (physician) and Advanced Practice Providers or APPs (Physician Assistants and Nurse Practitioners) who all work together to provide you with the care you need, when you need it.  Your next appointment:   Dr Cindie- 3 months  Dr Francyne- 1 year  We recommend signing up for the patient portal called MyChart.  Sign up information is provided on this After Visit Summary.  MyChart is used to connect with patients for Virtual Visits (Telemedicine).  Patients are able to view lab/test results, encounter notes, upcoming appointments, etc.  Non-urgent messages can be sent to your provider as well.   To learn more about what you can do with MyChart, go to [email or web address].          Signed, Jerel Francyne, MD  02/03/2024 5:26 PM    [facility]

## 2024-02-08 ENCOUNTER — Ambulatory Visit: Payer: Self-pay | Admitting: Cardiovascular Disease

## 2024-02-08 LAB — TSH: TSH: 2.65 u[IU]/mL (ref 0.450–4.500)

## 2024-02-14 ENCOUNTER — Other Ambulatory Visit: Payer: Self-pay | Admitting: Family Medicine

## 2024-02-14 DIAGNOSIS — E785 Hyperlipidemia, unspecified: Secondary | ICD-10-CM

## 2024-03-01 ENCOUNTER — Encounter: Payer: Self-pay | Admitting: Family Medicine

## 2024-03-01 ENCOUNTER — Ambulatory Visit: Admitting: Family Medicine

## 2024-03-01 VITALS — BP 116/76 | HR 55 | Temp 97.7°F | Ht 75.0 in | Wt 189.0 lb

## 2024-03-01 DIAGNOSIS — Z23 Encounter for immunization: Secondary | ICD-10-CM

## 2024-03-01 DIAGNOSIS — E785 Hyperlipidemia, unspecified: Secondary | ICD-10-CM | POA: Diagnosis not present

## 2024-03-01 LAB — LIPID PANEL
Cholesterol: 139 mg/dL (ref 0–200)
HDL: 34.8 mg/dL — ABNORMAL LOW (ref >39.00)
LDL Cholesterol: 67 mg/dL (ref 0–99)
NonHDL: 103.85
Total CHOL/HDL Ratio: 4
Triglycerides: 186 mg/dL — ABNORMAL HIGH (ref 0.0–149.0)
VLDL: 37.2 mg/dL (ref 0.0–40.0)

## 2024-03-01 LAB — COMPREHENSIVE METABOLIC PANEL WITH GFR
ALT: 19 U/L (ref 0–53)
AST: 22 U/L (ref 0–37)
Albumin: 4.2 g/dL (ref 3.5–5.2)
Alkaline Phosphatase: 86 U/L (ref 39–117)
BUN: 15 mg/dL (ref 6–23)
CO2: 29 meq/L (ref 19–32)
Calcium: 9.4 mg/dL (ref 8.4–10.5)
Chloride: 103 meq/L (ref 96–112)
Creatinine, Ser: 1.2 mg/dL (ref 0.40–1.50)
GFR: 59.15 mL/min — ABNORMAL LOW (ref >60.00)
Glucose, Bld: 83 mg/dL (ref 70–99)
Potassium: 4.1 meq/L (ref 3.5–5.1)
Sodium: 136 meq/L (ref 135–145)
Total Bilirubin: 0.6 mg/dL (ref 0.2–1.2)
Total Protein: 7.1 g/dL (ref 6.0–8.3)

## 2024-03-01 MED ORDER — PANTOPRAZOLE SODIUM 40 MG PO TBEC: 40.0000 mg | DELAYED_RELEASE_TABLET | Freq: Every day | ORAL | Status: AC

## 2024-03-01 NOTE — Assessment & Plan Note (Signed)
 Stable. Continue Ezetimibe  10mg  daily. Refilled medication. Ordered CMP to assess liver function and lipid panel. Patient is not fasting. Last ate 4 hours ago.

## 2024-03-01 NOTE — Progress Notes (Signed)
   Established Patient Office Visit   Subjective:  Patient ID: Steven Glover, male    DOB: 1950/10/23  Age: 73 y.o. MRN: [id number]  Chief Complaint  Patient presents with   Medical Management of Chronic Issues    6 month follow up    HPI Hyperlipidemia: Chronic. Patient is taking Ezetimibe  10mg  daily. Can not tolerate statins. He has no complaints with taking medication. Effective. He is followed by cardiology, but reports previously primary care managed his Ezetimibe  prescription.  Lab Results  Component Value Date   CHOL 137 08/31/2023   HDL 29.50 (L) 08/31/2023   LDLCALC 44 08/31/2023   TRIG 316.0 (H) 08/31/2023   CHOLHDL 5 08/31/2023    ROS See HPI above     Objective:   BP 116/76   Pulse (!) 55   Temp 97.7 F (36.5 C) (Oral)   Ht 6' 3 (1.905 m)   Wt 189 lb (85.7 kg)   SpO2 98%   BMI 23.62 kg/m    Physical Exam Vitals reviewed.  Constitutional:      General: He is not in acute distress.    Appearance: Normal appearance. He is normal weight. He is not ill-appearing, toxic-appearing or diaphoretic.  HENT:     Head: Normocephalic and atraumatic.  Eyes:     General:        Right eye: No discharge.        Left eye: No discharge.     Conjunctiva/sclera: Conjunctivae normal.  Cardiovascular:     Rate and Rhythm: Normal rate and regular rhythm.     Heart sounds: Normal heart sounds. No murmur heard.    No friction rub. No gallop.  Pulmonary:     Effort: Pulmonary effort is normal. No respiratory distress.     Breath sounds: Normal breath sounds.  Musculoskeletal:        General: Normal range of motion.  Skin:    General: Skin is warm and dry.  Neurological:     General: No focal deficit present.     Mental Status: He is alert and oriented to person, place, and time. Mental status is at baseline.  Psychiatric:        Mood and Affect: Mood normal.        Behavior: Behavior normal.        Thought Content: Thought content normal.        Judgment:  Judgment normal.    The 10-year ASCVD risk score (Arnett DK, et al., 2019) is: 22.3%    Assessment & Plan:  Dyslipidemia (high LDL; low HDL) Assessment & Plan: Stable. Continue Ezetimibe  10mg  daily. Refilled medication. Ordered CMP to assess liver function and lipid panel. Patient is not fasting. Last ate 4 hours ago.   Orders: -     Lipid panel -     Comprehensive metabolic panel with GFR  Immunization due -     Flu vaccine HIGH DOSE PF(Fluzone Trivalent)  Other orders -     Pantoprazole  Sodium; Take 1 tablet (40 mg total) by mouth daily.  1.Review health maintenance:  -Covid booster: Declines -Tdap vaccine: May obtain at local pharmacy -Zoster vaccine: Planning on getting at local pharmacy -Influenza vaccine: Administered  (Changed Pantoprazole  to once day instead of BID)  Return in about 6 months (around 08/30/2024) for chronic management.   Kimberly Nieland, NP

## 2024-03-01 NOTE — Patient Instructions (Signed)
-  It was great to see you today.  -Continue all medications. -Influenza vaccine given.  -Ordered labs. Office will call with lab results and will be available via MyChart.  -Follow up in 6 months.

## 2024-03-02 ENCOUNTER — Ambulatory Visit: Payer: Self-pay | Admitting: Family Medicine

## 2024-04-02 ENCOUNTER — Telehealth: Payer: Self-pay | Admitting: Cardiovascular Disease

## 2024-04-02 NOTE — Telephone Encounter (Signed)
 Reached out to patient and encouraged him to call his dme [facility] ( [phone]) and ask for a different kind of mask since the one he has is uncomfortable. Patient states he is eligible for a new mask.

## 2024-04-02 NOTE — Telephone Encounter (Signed)
 Pt would like to speak a nurse to see if could get another mask for his sleep machine and pt states its uncomfortable please advise

## 2024-04-06 ENCOUNTER — Ambulatory Visit
Admission: RE | Admit: 2024-04-06 | Discharge: 2024-04-06 | Disposition: A | Discharge status: Home / Self Care | Payer: Self-pay | Attending: Family Medicine | Admitting: Family Medicine

## 2024-04-06 VITALS — BP 120/69 | HR 63 | Temp 97.8°F | Resp 18

## 2024-04-06 DIAGNOSIS — J3089 Other allergic rhinitis: Secondary | ICD-10-CM | POA: Diagnosis not present

## 2024-04-06 DIAGNOSIS — J0101 Acute recurrent maxillary sinusitis: Secondary | ICD-10-CM

## 2024-04-06 MED ORDER — DOXYCYCLINE HYCLATE 100 MG PO CAPS: 100.0000 mg | ORAL_CAPSULE | Freq: Two times a day (BID) | ORAL | 0 refills | Status: DC

## 2024-04-06 NOTE — Discharge Instructions (Signed)
 Continue your allergy medication daily and a nasal spray such as Flonase  and/or Astelin  twice daily while symptomatic.  You may also take Coricidin HBP, plain Mucinex, use saline sinus rinses 3-4 times daily while symptomatic, humidifiers, drink plenty of fluids.  If worsening over the next 4 to 7 days you may start the antibiotic that I have prescribed given your history of significant sinus infections.

## 2024-04-06 NOTE — ED Triage Notes (Signed)
 Reports he has facial pressure, headache, and slight dizziness and x 2 days    Took claritan

## 2024-04-06 NOTE — ED Provider Notes (Signed)
 [facility]    CSN: [id number] Arrival date & time: 04/06/24  9146      History   Chief Complaint Chief Complaint  Patient presents with   Nasal Congestion    Entered by patient    HPI Steven Glover is a 73 y.o. male.   Patient presenting today with 2-day history of progressively worsening nasal congestion, sinus headache, facial pressure, dizziness with movement.  Denies cough, chest pain, shortness of breath, fever, chills, body aches, abdominal pain, vomiting, diarrhea.  Taking antihistamines with minimal relief.  History of significant seasonal allergies and asthma and significant sinus infections.    Past Medical History:  Diagnosis Date   Acid reflux disease    Allergy    Asthma    as child   Atrial fibrillation (HCC)    BMI between 19-24,adult JUL 2012 191.8 LBS   Collagen vascular disease    GERD (gastroesophageal reflux disease)    Internal hemorrhoids with other complication    TCS DEC 2013   Presence of Watchman left atrial appendage closure device 02/17/2023   31mm Watchman FLX Pro placed by Dr. 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   Aortic atherosclerosis 08/30/2022   Hiatal hernia 03/01/2022   Abnormal CT scan, gastrointestinal tract 03/01/2022   Myalgia due to statin 06/12/2021   Hx of colonic polyps    Thrombocytopenia 06/23/2016   BPH (benign prostatic hyperplasia) 01/28/2016   Dyslipidemia (high LDL; low HDL) 01/28/2016   Internal hemorrhoids with complication 12/14/2012   GERD  (gastroesophageal reflux disease) 04/21/2011   Colon cancer screening 04/21/2011   Dysphagia 12/30/2010    Past Surgical History:  Procedure Laterality Date   ATRIAL FIBRILLATION ABLATION N/A 12/21/2022   Procedure: ATRIAL FIBRILLATION ABLATION;  Surgeon: Cindie Ole DASEN, MD;  Location: [facility];  Service: Cardiovascular;  Laterality: N/A;   COLONOSCOPY  2009 NUR [facility]   MULTIPLE SIMPLE ADENOMAS   COLONOSCOPY N/A 05/02/2017   Dr. harvey: 4 simple adenomas removed, diverticulosis.  Next colonoscopy in 3 years.   COLONOSCOPY W/ POLYPECTOMY  2010 NUR   COLONOSCOPY WITH PROPOFOL   05/15/2012   SLF:Two sessile polyps ranging between 3-68mm in size were found in the ascending colon and rectum; multiple biopsies were performed/Mild diverticulosis was noted in the sigmoid colon/The colon mucosa was otherwise normal/ Moderate sized internal hemorrhoids. TCS 04/2017.   COLONOSCOPY WITH PROPOFOL  N/A 04/29/2020   Procedure: COLONOSCOPY WITH PROPOFOL ;  Surgeon: Cindie Carlin POUR, DO;  Location: [facility];  Service: Endoscopy;  Laterality: N/A;  7:30am   ESOPHAGOGASTRODUODENOSCOPY  01/01/2011 Ar:FPOI CHRONIC GASTRITIS   SLF: Hiatal Hernia/mild gastritis/stricture in the distal esophagus   FOOT SURGERY Left    LEFT ATRIAL APPENDAGE OCCLUSION N/A 02/17/2023   Procedure: LEFT ATRIAL APPENDAGE OCCLUSION;  Surgeon: Cindie Ole DASEN, MD;  Location: [facility];  Service: Cardiovascular;  Laterality: N/A;   POLYPECTOMY  05/15/2012   SIMPLE ADENOMA(2)   POLYPECTOMY  04/29/2020   Procedure: POLYPECTOMY;  Surgeon: Cindie Carlin POUR, DO;  Location: [facility];  Service: Endoscopy;;   SAVORY DILATION  01/01/2011   Procedure: SAVORY DILATION;  Surgeon: Margo CHRISTELLA Haddock, MD;  Location: [facility];  Service: Endoscopy;  Laterality: N/A;   SHOULDER OPEN ROTATOR CUFF REPAIR  2005   TEE WITHOUT CARDIOVERSION N/A 02/17/2023   Procedure: TRANSESOPHAGEAL ECHOCARDIOGRAM;  Surgeon: Cindie Ole DASEN, MD;  Location: [facility];  Service: Cardiovascular;  Laterality: N/A;   WRIST FUSION         Home Medications    Prior to Admission medications   Medication Sig Start Date End Date Taking? Authorizing Provider  doxycycline  (VIBRAMYCIN ) 100 MG capsule Take 1 capsule (100 mg total) by mouth 2 (two) times daily. 04/06/24  Yes Stuart Vernell Norris, PA-C  amiodarone  (PACERONE ) 100 MG tablet Take 1 tablet (100 mg total) by mouth daily. 01/30/24   Croitoru, Mihai, MD  ezetimibe  (ZETIA ) 10 MG tablet TAKE 1 TABLET BY MOUTH EVERY DAY 02/14/24   Williamson, Joanna R, NP  fexofenadine (ALLEGRA) 180 MG tablet Take 180 mg by mouth daily.    [provider]  folic acid (FOLVITE) 1 MG tablet Take 1 mg by mouth daily.      [provider]  Golimumab (SIMPONI ARIA IV) Inject into the vein. Every 8 weeks    [provider]  methotrexate 2.5 MG tablet Take 17.5 mg by mouth every Monday. 7 tablets-Monday    [provider]  pantoprazole  (PROTONIX ) 40 MG tablet Take 1 tablet (40 mg total) by mouth daily. 03/01/24 03/01/25  Billy Philippe SAUNDERS, NP    Family History Family History  Problem Relation Age of Onset   Heart disease Mother        Leaky Heart Valve   Diabetes Father    Cancer Sister        breast   Cancer Brother        intestines   Congestive Heart Failure Brother    Crohn's disease Daughter    Cancer Maternal Grandmother    Colon cancer Neg Hx    Colon polyps Neg Hx     Social History Social History   Tobacco Use   Smoking status: Never    Passive exposure: Never   Smokeless tobacco: Never   Tobacco comments:    Never smoked 01/18/23  Vaping Use   Vaping status: Never Used  Substance Use Topics   Alcohol use: No   Drug use: No     Allergies   Cefprozil  and Statins   Review of Systems Review of Systems Per HPI  Physical Exam Triage Vital Signs ED Triage Vitals [04/06/24 0914]  Encounter Vitals Group     BP  120/69     Girls Systolic BP Percentile      Girls Diastolic BP Percentile      Boys Systolic BP Percentile      Boys Diastolic BP Percentile      Pulse Rate 63     Resp 18     Temp 97.8 F (36.6 C)     Temp Source Oral     SpO2 96 %     Weight      Height      Head Circumference      Peak Flow      Pain Score 0     Pain Loc      Pain Education      Exclude from Growth Chart    No data found.  Updated Vital Signs BP 120/69 (BP Location:  Right Arm)   Pulse 63   Temp 97.8 F (36.6 C) (Oral)   Resp 18   SpO2 96%   Visual Acuity Right Eye Distance:   Left Eye Distance:   Bilateral Distance:    Right Eye Near:   Left Eye Near:    Bilateral Near:     Physical Exam Vitals and nursing note reviewed.  Constitutional:      Appearance: He is well-developed.  HENT:     Head: Atraumatic.     Right Ear: Tympanic membrane and external ear normal.     Left Ear: Tympanic membrane and external ear normal.     Nose: Congestion present.     Mouth/Throat:     Pharynx: Posterior oropharyngeal erythema present. No oropharyngeal exudate.  Eyes:     Conjunctiva/sclera: Conjunctivae normal.     Pupils: Pupils are equal, round, and reactive to light.  Cardiovascular:     Rate and Rhythm: Normal rate and regular rhythm.  Pulmonary:     Effort: Pulmonary effort is normal. No respiratory distress.     Breath sounds: No wheezing or rales.  Musculoskeletal:        General: Normal range of motion.     Cervical back: Normal range of motion and neck supple.  Lymphadenopathy:     Cervical: No cervical adenopathy.  Skin:    General: Skin is warm and dry.  Neurological:     Mental Status: He is alert and oriented to person, place, and time.  Psychiatric:        Behavior: Behavior normal.      UC Treatments / Results  Labs (all labs ordered are listed, but only abnormal results are displayed) Labs Reviewed - No data to display  EKG   Radiology No results  found.  Procedures Procedures (including critical care time)  Medications Ordered in UC Medications - No data to display  Initial Impression / Assessment and Plan / UC Course  I have reviewed the triage vital signs and the nursing notes.  Pertinent labs & imaging results that were available during my care of the patient were reviewed by me and considered in my medical decision making (see chart for details).     Suspect seasonal allergy exacerbation versus viral sinusitis.  Continue antihistamine daily, Astelin  nasal spray twice daily, saline sinus rinses, Coricidin HBP and other supportive remedies.  If progressively worsening over the next 4 to 7 days start the doxycycline  that I prescribed given his history of significant sinus infections.  Return for worsening or unresolving symptoms.  Final Clinical Impressions(s) / UC Diagnoses   Final diagnoses:  Acute recurrent maxillary sinusitis  Seasonal allergic rhinitis due to other allergic trigger     Discharge Instructions      Continue your allergy medication daily and a nasal spray such as Flonase  and/or Astelin  twice daily while symptomatic.  You may also take Coricidin HBP, plain Mucinex, use saline sinus rinses 3-4 times daily while symptomatic, humidifiers, drink plenty of fluids.  If worsening over the next 4 to 7 days you may start the antibiotic that I have prescribed given your history of significant sinus infections.    ED Prescriptions     Medication Sig Dispense Auth. Provider   doxycycline  (VIBRAMYCIN ) 100 MG capsule Take 1 capsule (100 mg total) by mouth 2 (two) times daily. 20 capsule Stuart Vernell Norris, [place]      PDMP not reviewed this encounter.   Stuart Vernell Norris, [place] 04/06/24 1004

## 2024-04-15 ENCOUNTER — Ambulatory Visit

## 2024-04-15 ENCOUNTER — Ambulatory Visit
Admission: EM | Admit: 2024-04-15 | Discharge: 2024-04-15 | Disposition: A | Discharge status: Home / Self Care | Attending: Nurse Practitioner | Admitting: Nurse Practitioner

## 2024-04-15 ENCOUNTER — Encounter: Payer: Self-pay | Admitting: Emergency Medicine

## 2024-04-15 DIAGNOSIS — Z8739 Personal history of other diseases of the musculoskeletal system and connective tissue: Secondary | ICD-10-CM

## 2024-04-15 DIAGNOSIS — R238 Other skin changes: Secondary | ICD-10-CM

## 2024-04-15 DIAGNOSIS — M25572 Pain in left ankle and joints of left foot: Secondary | ICD-10-CM

## 2024-04-15 MED ORDER — PREDNISONE 50 MG PO TABS
ORAL_TABLET | ORAL | 0 refills | Status: AC
Start: 2024-04-15 — End: ?

## 2024-04-15 MED ORDER — DEXAMETHASONE SOD PHOSPHATE PF 10 MG/ML IJ SOLN
10.0000 mg | Freq: Once | INTRAMUSCULAR | Status: AC
  Administered 2024-04-15: 10 mg via INTRAMUSCULAR

## 2024-04-15 NOTE — ED Triage Notes (Addendum)
 Left ankle pain that started at 3am this morning.  Very painful to touch and an intermittent pain that is a shooting pain.  Denies any injury.  States did take a pain pill this morning and this did not help with pain.  States it hurts for his pants to touch the area.

## 2024-04-15 NOTE — ED Provider Notes (Signed)
 [facility]    CSN: [id number] Arrival date & time: 04/15/24  0845      History   Chief Complaint Chief Complaint  Patient presents with   left ankle pain     HPI Johny D Bartl is a 73 y.o. male.   The history is provided by the patient.   Patient presents for complaints of left ankle pain.  Patient states symptoms started around 3 AM this morning.  He describes the pain as shooting and states that the area on his left ankle is very sensitive to touch.  States as the pain comes and goes.  He states that the pain does radiate up the left lower leg, that area is also sensitive.  He states that he does have swelling on the outer ankle bone.  He denies numbness, tingling, bruising, swelling, injury, or trauma.  He states he does not believe that he was bitten by anything.  Patient reports that he does have underlying history of RA, states that he receives weekly injections and his last injection was last week.  He did take a pain pill earlier this morning with minimal relief of his symptoms. Past Medical History:  Diagnosis Date   Acid reflux disease    Allergy    Asthma    as child   Atrial fibrillation (HCC)    BMI between 19-24,adult JUL 2012 191.8 LBS   Collagen vascular disease    GERD (gastroesophageal reflux disease)    Internal hemorrhoids with other complication    TCS DEC 2013   Presence of Watchman left atrial appendage closure device 02/17/2023   31mm Watchman FLX Pro placed by Dr. Cindie   Pulmonary nodule 2013   [facility] - Repeat 08/2012   Rheumatoid arthritis(714.0)    Skin cancer     Patient Active Problem List   Diagnosis Date Noted   OSA (obstructive sleep apnea) 06/14/2023   Presence of Watchman left atrial appendage closure device 02/17/2023   Atrial fibrillation (HCC) 02/17/2023   Paroxysmal atrial fibrillation (HCC) 09/27/2022   Hypercoagulable state due to paroxysmal atrial fibrillation (HCC) 09/27/2022   Panlobular emphysema (HCC)  08/30/2022   Aortic atherosclerosis 08/30/2022   Hiatal hernia 03/01/2022   Abnormal CT scan, gastrointestinal tract 03/01/2022   Myalgia due to statin 06/12/2021   Hx of colonic polyps    Thrombocytopenia 06/23/2016   BPH (benign prostatic hyperplasia) 01/28/2016   Dyslipidemia (high LDL; low HDL) 01/28/2016   Internal hemorrhoids with complication 12/14/2012   GERD (gastroesophageal reflux disease) 04/21/2011   Colon cancer screening 04/21/2011   Dysphagia 12/30/2010    Past Surgical History:  Procedure Laterality Date   ATRIAL FIBRILLATION ABLATION N/A 12/21/2022   Procedure: ATRIAL FIBRILLATION ABLATION;  Surgeon: Cindie Ole DASEN, MD;  Location: [facility];  Service: Cardiovascular;  Laterality: N/A;   COLONOSCOPY  2009 NUR [facility]   MULTIPLE SIMPLE ADENOMAS   COLONOSCOPY N/A 05/02/2017   Dr. harvey: 4 simple adenomas removed, diverticulosis.  Next colonoscopy in 3 years.   COLONOSCOPY W/ POLYPECTOMY  2010 NUR   COLONOSCOPY WITH PROPOFOL   05/15/2012   SLF:Two sessile polyps ranging between 3-25mm in size were found in the ascending colon and rectum; multiple biopsies were performed/Mild diverticulosis was noted in the sigmoid colon/The colon mucosa was otherwise normal/ Moderate sized internal hemorrhoids. TCS 04/2017.   COLONOSCOPY WITH PROPOFOL  N/A 04/29/2020   Procedure: COLONOSCOPY WITH PROPOFOL ;  Surgeon: Cindie Carlin POUR, DO;  Location: [facility];  Service: Endoscopy;  Laterality: N/A;  7:30am   ESOPHAGOGASTRODUODENOSCOPY  01/01/2011 Ar:FPOI CHRONIC GASTRITIS   SLF: Hiatal Hernia/mild gastritis/stricture in the distal esophagus   FOOT SURGERY Left    LEFT ATRIAL APPENDAGE OCCLUSION N/A 02/17/2023   Procedure: LEFT ATRIAL APPENDAGE OCCLUSION;  Surgeon: Cindie Ole DASEN, MD;  Location: [facility];  Service: Cardiovascular;  Laterality: N/A;   POLYPECTOMY  05/15/2012   SIMPLE ADENOMA(2)   POLYPECTOMY  04/29/2020   Procedure: POLYPECTOMY;  Surgeon:  Cindie Carlin POUR, DO;  Location: [facility];  Service: Endoscopy;;   SAVORY DILATION  01/01/2011   Procedure: SAVORY DILATION;  Surgeon: Margo CHRISTELLA Haddock, MD;  Location: [facility];  Service: Endoscopy;  Laterality: N/A;   SHOULDER OPEN ROTATOR CUFF REPAIR  2005   TEE WITHOUT CARDIOVERSION N/A 02/17/2023   Procedure: TRANSESOPHAGEAL ECHOCARDIOGRAM;  Surgeon: Cindie Ole DASEN, MD;  Location: [facility];  Service: Cardiovascular;  Laterality: N/A;   WRIST FUSION         Home Medications    Prior to Admission medications   Medication Sig Start Date End Date Taking? Authorizing Provider  amiodarone  (PACERONE ) 100 MG tablet Take 1 tablet (100 mg total) by mouth daily. 01/30/24   Croitoru, Mihai, MD  ezetimibe  (ZETIA ) 10 MG tablet TAKE 1 TABLET BY MOUTH EVERY DAY 02/14/24   Williamson, Joanna R, NP  fexofenadine (ALLEGRA) 180 MG tablet Take 180 mg by mouth daily.    [provider]  folic acid (FOLVITE) 1 MG tablet Take 1 mg by mouth daily.      [provider]  Golimumab (SIMPONI ARIA IV) Inject into the vein. Every 8 weeks    [provider]  methotrexate 2.5 MG tablet Take 17.5 mg by mouth every Monday. 7 tablets-Monday    [provider]  pantoprazole  (PROTONIX ) 40 MG tablet Take 1 tablet (40 mg total) by mouth daily. 03/01/24 03/01/25  Billy Philippe SAUNDERS, NP    Family History Family History  Problem Relation Age of Onset   Heart disease Mother        Leaky Heart Valve   Diabetes Father    Cancer Sister        breast   Cancer Brother        intestines   Congestive Heart Failure Brother    Crohn's disease Daughter    Cancer Maternal Grandmother    Colon cancer Neg Hx    Colon polyps Neg Hx     Social History Social History   Tobacco Use   Smoking status: Never    Passive exposure: Never   Smokeless tobacco: Never   Tobacco comments:    Never smoked 01/18/23  Vaping Use   Vaping status: Never Used  Substance Use Topics    Alcohol use: No   Drug use: No     Allergies   Cefprozil  and Statins   Review of Systems Review of Systems Per HPI  Physical Exam Triage Vital Signs ED Triage Vitals  Encounter Vitals Group     BP 04/15/24 0942 (!) 145/65     Girls Systolic BP Percentile --      Girls Diastolic BP Percentile --      Boys Systolic BP Percentile --      Boys Diastolic BP Percentile --      Pulse Rate 04/15/24 0942 (!) 58     Resp 04/15/24 0942 18     Temp 04/15/24 0942 98.2 F (36.8 C)     Temp  Source 04/15/24 0942 Oral     SpO2 04/15/24 0942 97 %     Weight --      Height --      Head Circumference --      Peak Flow --      Pain Score 04/15/24 0945 8     Pain Loc --      Pain Education --      Exclude from Growth Chart --    No data found.  Updated Vital Signs BP (!) 145/65 (BP Location: Right Arm)   Pulse (!) 58   Temp 98.2 F (36.8 C) (Oral)   Resp 18   SpO2 97%   Visual Acuity Right Eye Distance:   Left Eye Distance:   Bilateral Distance:    Right Eye Near:   Left Eye Near:    Bilateral Near:     Physical Exam Vitals and nursing note reviewed.  Constitutional:      General: He is not in acute distress.    Appearance: Normal appearance.  HENT:     Head: Normocephalic.  Eyes:     Extraocular Movements: Extraocular movements intact.     Pupils: Pupils are equal, round, and reactive to light.  Cardiovascular:     Rate and Rhythm: Normal rate and regular rhythm.     Pulses: Normal pulses.     Heart sounds: Normal heart sounds.  Pulmonary:     Effort: Pulmonary effort is normal. No respiratory distress.     Breath sounds: Normal breath sounds. No stridor. No wheezing, rhonchi or rales.  Musculoskeletal:     Cervical back: Normal range of motion.     Left ankle: Swelling (Mild swelling noted to the lateral malleolus.  There is no erythema, bruising, warmth, or deformity present.) present. No deformity or ecchymosis. Tenderness present over the lateral  malleolus. Anterior drawer test negative. Normal pulse.     Comments: Left malleolus with mild swelling.  There is no obvious warmth, erythema, bruising, or deformity present.  The area is hypersensitive to touch.  Area of pain and hypersensitivity extend approximately 2 cm above the left lateral malleolus.  Mild sensitivity noted to the posterior left leg above the malleolus.  +2 DP/PT pulses, neurovascularly intact.  Skin:    General: Skin is warm and dry.  Neurological:     General: No focal deficit present.     Mental Status: He is alert and oriented to person, place, and time.  Psychiatric:        Mood and Affect: Mood normal.        Behavior: Behavior normal.      UC Treatments / Results  Labs (all labs ordered are listed, but only abnormal results are displayed) Labs Reviewed - No data to display  EKG   Radiology DG Ankle Complete Left Result Date: 04/15/2024 EXAM: 3 OR MORE VIEW(S) XRAY OF THE LEFT ANKLE 04/15/2024 10:12:59 AM CLINICAL HISTORY: left ankle pain since 3 am, denies injury COMPARISON: None available. FINDINGS: BONES AND JOINTS: Partially visualized postsurgical changes in first metatarsal. No acute fracture. No focal osseous lesion. No joint dislocation. SOFT TISSUES: The soft tissues are unremarkable. IMPRESSION: 1. No acute abnormality. Electronically signed by: Waddell Calk MD 04/15/2024 10:33 AM EST RP Workstation: [id number]    Procedures Procedures (including critical care time)  Medications Ordered in UC Medications  dexamethasone  (DECADRON ) injection 10 mg (10 mg Intramuscular Given 04/15/24 1023)    Initial Impression / Assessment and Plan / UC Course  I have reviewed the triage vital signs and the nursing notes.  Pertinent labs & imaging results that were available during my care of the patient were reviewed by me and considered in my medical decision making (see chart for details).  X-ray of the left ankle is negative for fracture or  dislocation.  Patient presents for complaints of pain to the left lateral malleolus that started this morning while he was asleep.  No obvious injury or trauma noted.  On exam, he does have moderate hypersensitivity to the lateral malleolus and extending approximately 2 cm up the left lower leg.  Cannot discern true cause of the patient's symptoms at this time, it is important to note that he does have a history of RA.  Will treat with Decadron  10 mg, and start patient on prednisone  40 mg for the next 5 days.  Supportive care recommendations were provided and discussed with the patient to include over-the-counter analgesics, the use of ice, and to refrain from overuse today.  Patient was advised it is recommended to follow-up with rheumatology regarding his symptoms.  Patient was in agreement with this plan of care and verbalizes understanding.  All questions were answered.  Patient stable for discharge.  Final Clinical Impressions(s) / UC Diagnoses   Final diagnoses:  Acute left ankle pain  Skin sensitivity  History of rheumatoid arthritis   Discharge Instructions   None    ED Prescriptions   None    PDMP not reviewed this encounter.   Gilmer Etta PARAS, NP 04/15/24 1045

## 2024-04-15 NOTE — Discharge Instructions (Addendum)
 The x-ray was negative for fracture or dislocation. You were given an injection of Decadron  10 mg today.  Start the prednisone  tomorrow.  While you are taking the prednisone , do not take any additional NSAIDs to include ibuprofen, Aleve, Motrin, naproxen, or Advil.  You should take Tylenol  as needed for pain. RICE therapy, rest, ice, compression, and elevation.  Recommend resting the lower extremity today to see if this helps with your symptoms.  If you are able to tolerate ice, recommend applying ice to the affected area.  Apply for 20 minutes, remove for 1 hour, repeat as needed. As discussed, I would like for you to follow-up with rheumatology for reevaluation to ensure this is not related to your RA. Follow-up as needed.

## 2024-05-18 ENCOUNTER — Encounter: Payer: Self-pay | Admitting: Cardiology

## 2024-05-18 ENCOUNTER — Ambulatory Visit: Attending: Cardiology | Admitting: Cardiology

## 2024-05-18 VITALS — BP 130/76 | HR 60 | Ht 75.0 in | Wt 199.4 lb

## 2024-05-18 DIAGNOSIS — Z79899 Other long term (current) drug therapy: Secondary | ICD-10-CM | POA: Insufficient documentation

## 2024-05-18 DIAGNOSIS — I480 Paroxysmal atrial fibrillation: Secondary | ICD-10-CM | POA: Insufficient documentation

## 2024-05-18 DIAGNOSIS — Z5181 Encounter for therapeutic drug level monitoring: Secondary | ICD-10-CM | POA: Insufficient documentation

## 2024-05-18 DIAGNOSIS — G4733 Obstructive sleep apnea (adult) (pediatric): Secondary | ICD-10-CM | POA: Diagnosis present

## 2024-05-18 DIAGNOSIS — I10 Essential (primary) hypertension: Secondary | ICD-10-CM | POA: Diagnosis present

## 2024-05-18 NOTE — Progress Notes (Signed)
" °  Electrophysiology Office Follow up Visit Note:    Date:  05/18/2024   ID:  Steven Glover, DOB [birth year], MRN [id number]  PCP:  Billy Philippe SAUNDERS, NP  [facility] Cardiologist:  None  [facility] Electrophysiologist:  Fonda Kitty, MD    Interval History:     Steven Glover is a 73 y.o. male who presents for a follow up visit.   The patient saw [facility] January 18, 2023.  He has a history of atrial fibrillation with a prior ablation on December 21, 2022.  He was on amiodarone  around the time of his ablation.  He saw Dr. Francyne who in follow-up January 30, 2024.  He has a youth worker.  He was still on amiodarone  when he saw Dr. Christi.  He is doing well today.  No complaints related to his rhythm.  He does report intermittent balance issues.  He has not fallen.  These episodes occur when he stands up.      Past medical, surgical, social and family history were reviewed.  ROS:   Please see the history of present illness.    All other systems reviewed and are negative.  EKGs/Labs/Other Studies Reviewed:    The following studies were reviewed today:     EKG Interpretation Date/Time:  Friday May 18 2024 09:10:50 EST Ventricular Rate:  60 PR Interval:  160 QRS Duration:  90 QT Interval:  436 QTC Calculation: 436 R Axis:   27  Text Interpretation: Normal sinus rhythm Normal ECG Confirmed by Cindie Smalls [phone]) on 05/18/2024 9:12:59 AM    Physical Exam:    VS:  BP 130/76 (BP Location: Left Arm, Patient Position: Sitting, Cuff Size: Large)   Pulse 60   Ht 6' 3 (1.905 m)   Wt 199 lb 6.4 oz (90.4 kg)   SpO2 98%   BMI 24.92 kg/m     Wt Readings from Last 3 Encounters:  05/18/24 199 lb 6.4 oz (90.4 kg)  03/01/24 189 lb (85.7 kg)  01/30/24 194 lb 6.4 oz (88.2 kg)     GEN: no distress CARD: RRR, No MRG RESP: No IWOB. CTAB.      ASSESSMENT:    1. Paroxysmal atrial fibrillation (HCC)   2. Encounter for monitoring amiodarone  therapy    3. OSA (obstructive sleep apnea)   4. Essential hypertension    PLAN:    In order of problems listed above:  #Persistent atrial fibrillation #High risk medication monitoring-amiodarone  Doing well after prior catheter ablation.  No known recurrence.  Would favor stopping amiodarone  at this time.  The patient is in agreement.  #Obstructive sleep apnea Encouraged to continue CPAP use  #Hypertension at goal today.  Recommend checking blood pressures 1-2 times per week at home and recording the values.  Recommend bringing these recordings to the primary care physician.  I discussed my upcoming departure from [facility] during today's clinic appointment.  The patient will continue to follow-up with one of my EP partners moving forward.  Follow-up 6 months with EP APP   Signed, Smalls Cindie, MD, [facility], [facility] 05/18/2024 9:17 AM    Electrophysiology [facility] "

## 2024-05-18 NOTE — Patient Instructions (Signed)

## 2024-08-30 ENCOUNTER — Ambulatory Visit: Admitting: Family Medicine

## 2024-08-31 ENCOUNTER — Ambulatory Visit: Admitting: Family Medicine

## 2024-10-31 ENCOUNTER — Ambulatory Visit
